# Patient Record
Sex: Female | Born: 1955 | Race: White | Hispanic: No | Marital: Single | Smoking: Former smoker
Health system: Southern US, Community
[De-identification: ages and names within clinical notes are randomized; demographics above are authoritative.]

## PROBLEM LIST (undated history)

## (undated) DIAGNOSIS — Z8711 Personal history of peptic ulcer disease: Secondary | ICD-10-CM

## (undated) DIAGNOSIS — M5136 Other intervertebral disc degeneration, lumbar region: Secondary | ICD-10-CM

## (undated) DIAGNOSIS — R112 Nausea with vomiting, unspecified: Secondary | ICD-10-CM

## (undated) DIAGNOSIS — Z860100 Personal history of colon polyps, unspecified: Secondary | ICD-10-CM

## (undated) DIAGNOSIS — K219 Gastro-esophageal reflux disease without esophagitis: Secondary | ICD-10-CM

## (undated) DIAGNOSIS — M1990 Unspecified osteoarthritis, unspecified site: Secondary | ICD-10-CM

## (undated) DIAGNOSIS — K209 Esophagitis, unspecified without bleeding: Secondary | ICD-10-CM

## (undated) DIAGNOSIS — Z8719 Personal history of other diseases of the digestive system: Secondary | ICD-10-CM

## (undated) DIAGNOSIS — Z87410 Personal history of cervical dysplasia: Secondary | ICD-10-CM

## (undated) DIAGNOSIS — Z859 Personal history of malignant neoplasm, unspecified: Secondary | ICD-10-CM

## (undated) DIAGNOSIS — Z9889 Other specified postprocedural states: Secondary | ICD-10-CM

## (undated) DIAGNOSIS — I2510 Atherosclerotic heart disease of native coronary artery without angina pectoris: Secondary | ICD-10-CM

## (undated) DIAGNOSIS — L280 Lichen simplex chronicus: Secondary | ICD-10-CM

## (undated) DIAGNOSIS — C519 Malignant neoplasm of vulva, unspecified: Secondary | ICD-10-CM

## (undated) DIAGNOSIS — E785 Hyperlipidemia, unspecified: Secondary | ICD-10-CM

## (undated) DIAGNOSIS — L409 Psoriasis, unspecified: Secondary | ICD-10-CM

## (undated) DIAGNOSIS — M419 Scoliosis, unspecified: Secondary | ICD-10-CM

## (undated) DIAGNOSIS — K2970 Gastritis, unspecified, without bleeding: Secondary | ICD-10-CM

## (undated) DIAGNOSIS — M51369 Other intervertebral disc degeneration, lumbar region without mention of lumbar back pain or lower extremity pain: Secondary | ICD-10-CM

## (undated) DIAGNOSIS — C4499 Other specified malignant neoplasm of skin, unspecified: Secondary | ICD-10-CM

## (undated) DIAGNOSIS — I10 Essential (primary) hypertension: Secondary | ICD-10-CM

## (undated) HISTORY — DX: Psoriasis, unspecified: L40.9

## (undated) HISTORY — PX: COLONOSCOPY WITH ESOPHAGOGASTRODUODENOSCOPY (EGD): SHX5779

## (undated) HISTORY — PX: BREAST EXCISIONAL BIOPSY: SUR124

## (undated) HISTORY — PX: VAGINAL HYSTERECTOMY: SUR661

## (undated) HISTORY — PX: TONSILLECTOMY: SUR1361

## (undated) HISTORY — PX: CATARACT EXTRACTION W/ INTRAOCULAR LENS  IMPLANT, BILATERAL: SHX1307

## (undated) HISTORY — PX: REDUCTION MAMMAPLASTY: SUR839

## (undated) HISTORY — DX: Malignant neoplasm of vulva, unspecified: C51.9

## (undated) HISTORY — DX: Other specified malignant neoplasm of skin, unspecified: C44.99

## (undated) HISTORY — PX: CARDIOVASCULAR STRESS TEST: SHX262

---

## 1998-11-01 ENCOUNTER — Other Ambulatory Visit: Admission: RE | Admit: 1998-11-01 | Discharge: 1998-11-01 | Payer: Self-pay | Admitting: Surgery

## 2000-03-13 ENCOUNTER — Encounter: Admission: RE | Admit: 2000-03-13 | Discharge: 2000-04-13 | Payer: Self-pay | Admitting: Internal Medicine

## 2001-08-04 ENCOUNTER — Encounter: Payer: Self-pay | Admitting: Internal Medicine

## 2001-08-04 ENCOUNTER — Encounter: Admission: RE | Admit: 2001-08-04 | Discharge: 2001-08-04 | Payer: Self-pay

## 2001-10-29 ENCOUNTER — Ambulatory Visit (HOSPITAL_BASED_OUTPATIENT_CLINIC_OR_DEPARTMENT_OTHER): Admission: RE | Admit: 2001-10-29 | Discharge: 2001-10-29 | Payer: Self-pay | Admitting: Urology

## 2002-11-24 ENCOUNTER — Ambulatory Visit (HOSPITAL_COMMUNITY): Admission: RE | Admit: 2002-11-24 | Discharge: 2002-11-24 | Payer: Self-pay | Admitting: Gastroenterology

## 2003-10-17 ENCOUNTER — Ambulatory Visit (HOSPITAL_BASED_OUTPATIENT_CLINIC_OR_DEPARTMENT_OTHER): Admission: RE | Admit: 2003-10-17 | Discharge: 2003-10-17 | Payer: Self-pay | Admitting: Orthopedic Surgery

## 2004-10-24 ENCOUNTER — Other Ambulatory Visit: Admission: RE | Admit: 2004-10-24 | Discharge: 2004-10-24 | Payer: Self-pay | Admitting: Internal Medicine

## 2006-03-11 ENCOUNTER — Other Ambulatory Visit: Admission: RE | Admit: 2006-03-11 | Discharge: 2006-03-11 | Payer: Self-pay | Admitting: Internal Medicine

## 2006-05-05 HISTORY — PX: BUNIONECTOMY: SHX129

## 2006-05-05 HISTORY — PX: BLADDER SUSPENSION: SHX72

## 2010-07-30 ENCOUNTER — Other Ambulatory Visit (HOSPITAL_BASED_OUTPATIENT_CLINIC_OR_DEPARTMENT_OTHER): Payer: Self-pay | Admitting: Family Medicine

## 2010-07-30 DIAGNOSIS — R221 Localized swelling, mass and lump, neck: Secondary | ICD-10-CM

## 2010-07-31 ENCOUNTER — Ambulatory Visit (HOSPITAL_BASED_OUTPATIENT_CLINIC_OR_DEPARTMENT_OTHER)
Admission: RE | Admit: 2010-07-31 | Discharge: 2010-07-31 | Disposition: A | Discharge status: Home / Self Care | Payer: BC Managed Care – PPO | Source: Ambulatory Visit | Attending: Family Medicine | Admitting: Family Medicine

## 2010-07-31 ENCOUNTER — Other Ambulatory Visit (HOSPITAL_BASED_OUTPATIENT_CLINIC_OR_DEPARTMENT_OTHER): Payer: Self-pay

## 2010-07-31 DIAGNOSIS — E041 Nontoxic single thyroid nodule: Secondary | ICD-10-CM | POA: Insufficient documentation

## 2010-07-31 DIAGNOSIS — R220 Localized swelling, mass and lump, head: Secondary | ICD-10-CM

## 2010-07-31 DIAGNOSIS — R221 Localized swelling, mass and lump, neck: Secondary | ICD-10-CM

## 2011-05-06 HISTORY — PX: VULVECTOMY: SHX1086

## 2012-07-02 ENCOUNTER — Ambulatory Visit: Payer: Self-pay | Admitting: Gynecology

## 2012-07-02 ENCOUNTER — Encounter: Payer: Self-pay | Admitting: Gynecology

## 2012-07-02 ENCOUNTER — Ambulatory Visit: Payer: BC Managed Care – PPO | Attending: Gynecology | Admitting: Gynecology

## 2012-07-02 VITALS — BP 124/90 | HR 66 | Temp 98.1°F | Resp 16 | Ht 63.0 in | Wt 115.5 lb

## 2012-07-02 DIAGNOSIS — N949 Unspecified condition associated with female genital organs and menstrual cycle: Secondary | ICD-10-CM | POA: Insufficient documentation

## 2012-07-02 DIAGNOSIS — C519 Malignant neoplasm of vulva, unspecified: Secondary | ICD-10-CM

## 2012-07-02 NOTE — Patient Instructions (Signed)
Return to see me in 6 months for a followup visit or as needed if you developed new symptoms.

## 2012-07-02 NOTE — Progress Notes (Signed)
Consult Note: Gyn-Onc   Stephanie Sanchez 57 y.o. female  Chief Complaint  Patient presents with  . Paget's Disease of the Vulva    New patient    Interval History: The patient underwent wide local excision of a left vulvar Paget's disease at [facility] on 05/31/2012. Her postoperative course was uncomplicated. Surgical margins were positive in several areas which is not surprising. Today she presents noting that she has some discomfort when wearing tight undergarments and some pulling in the area of the surgery. She denies any bleeding discharge or fever.   HPI: The patient presented with a left vulvar Paget's disease which was treated with wide local excision on 05/31/2012.  Review of Systems:10 point review of systems is negative as noted above.   Vitals: Blood pressure 124/90, pulse 66, temperature 98.1 F (36.7 C), resp. rate 16, height 5\' 3"  (1.6 m), weight 115 lb 8 oz (52.39 kg).  Physical Exam: General : The patient is a healthy woman in no acute distress.  HEENT: normocephalic, extraoccular movements normal; neck is supple without thyromegally  Lynphnodes: Supraclavicular and inguinal nodes not enlarged  Abdomen: Soft, non-tender, no ascites, no organomegally, no masses, no hernias  Pelvic:  EGBUS: Normal female, the surgical site is healing very well and is barely noticeable.  Vagina: Normal, no lesions  Urethra and Bladder: Normal, non-tender     Lower extremities: No edema or varicosities. Normal range of motion    Assessment/Plan: Paget's disease the left vulva. She is healing well. She will continue pelvic rest release 2 more weeks. If she has any itching she may use 10% cortisone cream. She return to see me in 6 months for continuing followup.  Allergies  Allergen Reactions  . Codeine     "comes out like a burn/rash"    Past Medical History  Diagnosis Date  . Gastric ulcer   . Paget's disease of vulva     Past Surgical History  Procedure Laterality  Date  . Vulvectomy    . Hand surgery  11/02/12    hand reconstruction at [facility]  . Abdominal hysterectomy      at age 46  . Tonsillectomy      at 16  . Bunionectomy      15 years ago  . Bladder suspension      10 years ago    Current Outpatient Prescriptions  Medication Sig Dispense Refill  . ALPRAZolam (XANAX) 1 MG tablet Take 0.5 mg by mouth at bedtime as needed for sleep.      . Cholecalciferol (VITAMIN D) 2000 UNITS tablet Take 2,000 Units by mouth daily.      Marland Kitchen estradiol (VIVELLE-DOT) 0.05 MG/24HR Place 1 patch onto the skin once a week.      Marland Kitchen omeprazole (PRILOSEC) 40 MG capsule Take 40 mg by mouth daily.       No current facility-administered medications for this visit.    History   Social History  . Marital Status: Divorced    Spouse Name: N/A    Number of Children: N/A  . Years of Education: N/A   Occupational History  . Not on file.   Social History Main Topics  . Smoking status: Never Smoker   . Smokeless tobacco: Not on file  . Alcohol Use: Yes     Comment: occas  . Drug Use: No  . Sexually Active: Not on file   Other Topics Concern  . Not on file   Social History Narrative  .  No narrative on file    Family History  Problem Relation Age of Onset  . Aortic aneurysm Mother   . Diabetes Father   . Thyroid cancer Daughter 38      Jeannette Corpus, MD 07/02/2012, 3:58 PM

## 2012-11-02 HISTORY — PX: HAND SURGERY: SHX662

## 2012-12-07 ENCOUNTER — Ambulatory Visit: Payer: BC Managed Care – PPO | Attending: Gynecology | Admitting: Gynecology

## 2012-12-07 ENCOUNTER — Encounter: Payer: Self-pay | Admitting: Gynecology

## 2012-12-07 VITALS — BP 118/80 | HR 64 | Temp 97.8°F | Resp 16 | Wt 116.8 lb

## 2012-12-07 DIAGNOSIS — Z8544 Personal history of malignant neoplasm of other female genital organs: Secondary | ICD-10-CM | POA: Insufficient documentation

## 2012-12-07 DIAGNOSIS — C519 Malignant neoplasm of vulva, unspecified: Secondary | ICD-10-CM

## 2012-12-07 DIAGNOSIS — Z09 Encounter for follow-up examination after completed treatment for conditions other than malignant neoplasm: Secondary | ICD-10-CM | POA: Insufficient documentation

## 2012-12-07 DIAGNOSIS — C4499 Other specified malignant neoplasm of skin, unspecified: Secondary | ICD-10-CM

## 2012-12-07 NOTE — Patient Instructions (Signed)
Return to see us in 6 months. 

## 2012-12-07 NOTE — Progress Notes (Signed)
Consult Note: Gyn-Onc   Stephanie Sanchez 57 y.o. female  Chief Complaint  Patient presents with  . Paget's Disease of Vulva    Follow up    Assessment: Paget's disease of the vulva. Clinically free of disease at present time.  Plan: The patient return to see [place] in 6 months.  Interval History: The patient underwent wide local excision of a left vulvar Paget's disease at [facility] on 05/31/2012. Her postoperative course was uncomplicated. Surgical margins were positive in several areas which is not surprising. Since her last visit she's done well. She denies any vulvar symptoms of burning or pruritus. She has not noticed any new lesions.   HPI: The patient presented with a left vulvar Paget's disease which was treated with wide local excision on 05/31/2012.  Review of Systems:10 point review of systems is negative as noted above.   Vitals: Blood pressure 118/80, pulse 64, temperature 97.8 F (36.6 C), temperature source Oral, resp. rate 16, weight 116 lb 12.8 oz (52.98 kg).  Physical Exam: General : The patient is a healthy woman in no acute distress.  HEENT: normocephalic, extraoccular movements normal; neck is supple without thyromegally  Lynphnodes: Supraclavicular and inguinal nodes not enlarged  Abdomen: Soft, non-tender, no ascites, no organomegally, no masses, no hernias  Pelvic:  EGBUS: Normal female, the surgical site is completely healed. The scar is very thin and very pliable. No new lesions are noted. Vagina: Normal, no lesions  Urethra and Bladder: Normal, non-tender     Lower extremities: No edema or varicosities. Normal range of motion    Assessment/Plan: Paget's disease the left vulva. She is healing well. She will continue pelvic rest release 2 more weeks. If she has any itching she may use 10% cortisone cream. She return to see me in 6 months for continuing followup.  Allergies  Allergen Reactions  . Codeine     "comes out like a burn/rash"    Past  Medical History  Diagnosis Date  . Gastric ulcer   . Paget's disease of vulva     Past Surgical History  Procedure Laterality Date  . Vulvectomy    . Hand surgery  11/02/12    hand reconstruction at [facility]  . Abdominal hysterectomy      at age 58  . Tonsillectomy      at 16  . Bunionectomy      15 years ago  . Bladder suspension      10 years ago    Current Outpatient Prescriptions  Medication Sig Dispense Refill  . ALPRAZolam (XANAX) 1 MG tablet Take 0.5 mg by mouth at bedtime as needed for sleep.      . Cholecalciferol (VITAMIN D) 2000 UNITS tablet Take 2,000 Units by mouth daily.      Marland Kitchen estradiol (VIVELLE-DOT) 0.05 MG/24HR Place 1 patch onto the skin once a week.      Marland Kitchen omeprazole (PRILOSEC) 40 MG capsule Take 40 mg by mouth daily.       No current facility-administered medications for this visit.    History   Social History  . Marital Status: Divorced    Spouse Name: N/A    Number of Children: N/A  . Years of Education: N/A   Occupational History  . Not on file.   Social History Main Topics  . Smoking status: Never Smoker   . Smokeless tobacco: Not on file  . Alcohol Use: Yes     Comment: occas  . Drug Use: No  .  Sexually Active: Not on file   Other Topics Concern  . Not on file   Social History Narrative  . No narrative on file    Family History  Problem Relation Age of Onset  . Aortic aneurysm Mother   . Diabetes Father   . Thyroid cancer Daughter 68      Jeannette Corpus, MD 12/07/2012, 9:39 AM

## 2013-06-03 ENCOUNTER — Ambulatory Visit (HOSPITAL_BASED_OUTPATIENT_CLINIC_OR_DEPARTMENT_OTHER)
Admission: RE | Admit: 2013-06-03 | Discharge: 2013-06-03 | Disposition: A | Discharge status: Home / Self Care | Payer: BC Managed Care – PPO | Source: Ambulatory Visit | Attending: Family Medicine | Admitting: Family Medicine

## 2013-06-03 ENCOUNTER — Other Ambulatory Visit (HOSPITAL_BASED_OUTPATIENT_CLINIC_OR_DEPARTMENT_OTHER): Payer: Self-pay | Admitting: Family Medicine

## 2013-06-03 DIAGNOSIS — R109 Unspecified abdominal pain: Secondary | ICD-10-CM | POA: Insufficient documentation

## 2013-06-03 DIAGNOSIS — M51379 Other intervertebral disc degeneration, lumbosacral region without mention of lumbar back pain or lower extremity pain: Secondary | ICD-10-CM | POA: Insufficient documentation

## 2013-06-03 DIAGNOSIS — M5137 Other intervertebral disc degeneration, lumbosacral region: Secondary | ICD-10-CM | POA: Insufficient documentation

## 2013-09-22 ENCOUNTER — Ambulatory Visit (INDEPENDENT_AMBULATORY_CARE_PROVIDER_SITE_OTHER): Payer: BC Managed Care – PPO | Admitting: Cardiology

## 2013-09-22 ENCOUNTER — Encounter (INDEPENDENT_AMBULATORY_CARE_PROVIDER_SITE_OTHER): Payer: Self-pay

## 2013-09-22 ENCOUNTER — Encounter: Payer: Self-pay | Admitting: Cardiology

## 2013-09-22 VITALS — BP 140/98 | HR 72 | Ht 63.0 in | Wt 118.0 lb

## 2013-09-22 DIAGNOSIS — R079 Chest pain, unspecified: Secondary | ICD-10-CM

## 2013-09-22 NOTE — Progress Notes (Signed)
[facility] [address], [address], [place]  [address] Phone: [phone] Fax:  [phone]  Date:  09/22/2013   ID:  Stephanie Sanchez, DOB 1955-09-28, MRN [id number]  PCP:  Abigail Miyamoto, MD   History of Present Illness: Stephanie Sanchez is a 58 y.o. female here for the evaluation of chest pain. Last office visit was December of 2012 with Nuclear stress test at that time was low risk. Over the last few months she has noted significant pressure/intense pain in the chest that stops her, makes her lay down and rest. Heaviness seems to have increased in the past few weeks and at times radiates to neck and jaw. Recently, head pressure and chest, faintly, and throat, short guarding pain to abdomen.   She is an avid walker for over 25 years, 4.5 miles an hour on treadmill 40 minutes each afternoon. 5 days a week. [employer]. 2 glasses sometimes 3 per night and she plans to completely stop.   LDL cholesterol 151. She did have an EGD over year ago which showed multiple superficial ulcers. Her father died at age 57 from heart disease, diabetes. She quit smoking at age 40. She was given metoprolol by Dr. Maceo Pro which she has not started.    Wt Readings from Last 3 Encounters:  09/22/13 118 lb (53.524 kg)     No past medical history on file.  No past surgical history on file.  Current Outpatient Prescriptions  Medication Sig Dispense Refill  . ALPRAZolam (XANAX) 1 MG tablet Take 0.05 mg by mouth at bedtime.       . Aspirin-Acetaminophen-Caffeine (EXCEDRIN PO) Take by mouth.      . Cholecalciferol (VITAMIN D) 2000 UNITS CAPS Take by mouth daily.      . Coenzyme Q10 (CO Q-10) 200 MG CAPS Take by mouth daily.      Marland Kitchen MINIVELLE 0.05 MG/24HR patch Place 1 patch onto the skin 2 (two) times a week.       Marland Kitchen NITROSTAT 0.4 MG SL tablet Place 0.4 mg under the tongue every 5 (five) minutes as needed.       . NON FORMULARY Wheat grass      . tretinoin (RETIN-A) 0.1 % cream Apply topically at  bedtime.      . TURMERIC PO Take by mouth.      . vitamin C (ASCORBIC ACID) 500 MG tablet Take 500 mg by mouth 2 (two) times daily.       No current facility-administered medications for this visit.    Allergies:    Allergies  Allergen Reactions  . Codeine Itching and Nausea And Vomiting    Social History:  The patient  reports that she has quit smoking. She does not have any smokeless tobacco history on file. She reports that she drinks alcohol. She reports that she does not use illicit drugs. Merlot.  Family History  Problem Relation Age of Onset  . AAA (abdominal aortic aneurysm) Mother   . Diabetes Father   . Other Father     enlarged heart  . Arrhythmia Brother   . Heart attack Maternal Grandfather   . Arrhythmia Brother     ROS:  Please see the history of present illness.   Denies fevers, bleeding, rash, syncope, orthopnea, PND, stroke symptoms.    All other systems reviewed and negative.   PHYSICAL EXAM: VS:  BP 140/98  Pulse 72  Ht 5\' 3"  (1.6 m)  Wt 118 lb (53.524  kg)  BMI 20.91 kg/m2 Well nourished, well developed, in no acute distress HEENT: normal, [place]/AT, EOMI Neck: no JVD, normal carotid upstroke, no bruit Cardiac:  normal S1, S2; RRR; no murmur Lungs:  clear to auscultation bilaterally, no wheezing, rhonchi or rales Abd: soft, nontender, no hepatomegaly, no bruits Ext: no edema, 2+ distal pulses Skin: warm and dry GU: deferred Neuro: no focal abnormalities noted, AAO x 3  EKG:  09/12/13-sinus rhythm, left anterior fascicular block, poor R wave progression no other ST segment changes.   Nuclear stress test 04/15/11-no ischemia, low risk, normal EF    ASSESSMENT AND PLAN:  1. Chest pain-possible anginal symptoms. We'll pursue nuclear stress test for further stratification. If necessary, we will start carvedilol 3.125 mg twice a day. Occasionally the alpha-blocker will help with possible microvascular disease. Continue to monitor her symptoms. I will see  her back in 6 months. I agree with alcohol cessation for her.  Signed, Candee Furbish, MD [facility]  09/22/2013 11:41 AM

## 2013-09-22 NOTE — Patient Instructions (Signed)
Your physician recommends that you continue on your current medications as directed. Please refer to the Current Medication list given to you today.  Your physician has requested that you have en exercise stress myoview. For further information please visit [email or web address]. Please follow instruction sheet, as given.  Your physician wants you to follow-up in: 6 months with Dr. Dawna Part will receive a reminder letter in the mail two months in advance. If you don't receive a letter, please call our office to schedule the follow-up appointment.

## 2013-09-30 ENCOUNTER — Ambulatory Visit (HOSPITAL_COMMUNITY): Payer: BC Managed Care – PPO | Attending: Cardiology | Admitting: Radiology

## 2013-09-30 VITALS — BP 177/102 | HR 71 | Ht 63.0 in | Wt 116.0 lb

## 2013-09-30 DIAGNOSIS — R002 Palpitations: Secondary | ICD-10-CM | POA: Insufficient documentation

## 2013-09-30 DIAGNOSIS — R079 Chest pain, unspecified: Secondary | ICD-10-CM | POA: Insufficient documentation

## 2013-09-30 MED ORDER — REGADENOSON 0.4 MG/5ML IV SOLN
0.4000 mg | Freq: Once | INTRAVENOUS | Status: AC
  Administered 2013-09-30: 0.4 mg via INTRAVENOUS

## 2013-09-30 MED ORDER — TECHNETIUM TC 99M SESTAMIBI GENERIC - CARDIOLITE
33.0000 | Freq: Once | INTRAVENOUS | Status: AC | PRN
  Administered 2013-09-30: 33 via INTRAVENOUS

## 2013-09-30 MED ORDER — TECHNETIUM TC 99M SESTAMIBI GENERIC - CARDIOLITE
11.0000 | Freq: Once | INTRAVENOUS | Status: AC | PRN
  Administered 2013-09-30: 11 via INTRAVENOUS

## 2013-09-30 NOTE — Progress Notes (Signed)
[facility] [address] [place], [place] [address] [phone]    Cardiology Nuclear Med Study  Stephanie Sanchez is a 58 y.o. female     MRN : [id number]     DOB: 1956-04-29  Procedure Date: 09/30/2013  Nuclear Med Background Indication for Stress Test:  Evaluation for Ischemia History:  no prior hx CAD; 2012 MPI-normal, EF 73% Cardiac Risk Factors: Family History - CAD, History of Smoking and Hypertension  Symptoms:  Chest Pain and Palpitations   Nuclear Pre-Procedure Caffeine/Decaff Intake:  None NPO After: 7:00pm   Lungs:  clear O2 Sat: 94% on room air. IV 0.9% NS with Angio Cath:  20g  IV Site: R Hand  IV Started by:  Annye Rusk, CNMT  Chest Size (in):  32 Cup Size: DDD  Height: 5\' 3"  (1.6 m)  Weight:  116 lb (52.617 kg)  BMI:  Body mass index is 20.55 kg/(m^2). Tech Comments:  No am meds    Nuclear Med Study 1 or 2 day study: 1 day  Stress Test Type:  Treadmill/Lexiscan  Reading MD: Mertie Moores, MD  Order Authorizing Provider:  Jerilynn Mages. Skains, MD  Resting Radionuclide: Technetium 69m Sestamibi  Resting Radionuclide Dose: 11.0 mCi   Stress Radionuclide:  Technetium 25m Sestamibi  Stress Radionuclide Dose: 33.0 mCi           Stress Protocol Rest HR: 71 Stress HR: 139  Rest BP: 177/102 Stress BP: 150/101  Exercise Time (min): n/a METS: n/a           Dose of Adenosine (mg):  n/a Dose of Lexiscan: 0.4 mg  Dose of Atropine (mg): n/a Dose of Dobutamine: n/a mcg/kg/min (at max HR)  Stress Test Technologist: Glade Lloyd, BS-ES  Nuclear Technologist:  Charlton Amor, CNMT     Rest Procedure:  Myocardial perfusion imaging was performed at rest 45 minutes following the intravenous administration of Technetium 53m Sestamibi. Rest ECG: NSR with PVCs  Stress Procedure:  The patient received IV Lexiscan 0.4 mg over 15-seconds with concurrent low level exercise and then Technetium 65m Sestamibi was injected at 30-seconds while the patient  continued walking one more minute.  Quantitative spect images were obtained after a 45-minute delay.  Attempted to walk patient on Bruce Protocol but diastolic BP too high.  Changed to Low Level Lexican,  During the infusion the patient complained of SOB and stomach cramps.  This began to resolve in recovery.  Stress ECG: No significant change from baseline ECG  QPS Raw Data Images:  Normal; no motion artifact; normal heart/lung ratio.   Stress Images:  Normal homogeneous uptake in all areas of the myocardium. Rest Images:  Normal homogeneous uptake in all areas of the myocardium. Subtraction (SDS):  No evidence of ischemia. Transient Ischemic Dilatation (Normal <1.22):  1.00 Lung/Heart Ratio (Normal <0.45):  0.35  Quantitative Gated Spect Images QGS EDV:  62 ml QGS ESV:  24 ml  Impression Exercise Capacity:  Lexiscan with low level exercise. BP Response:  Normal blood pressure response. Clinical Symptoms:  No significant symptoms noted. ECG Impression:  No significant ST segment change suggestive of ischemia. Comparison with Prior Nuclear Study: No images to compare  Overall Impression:  Normal stress nuclear study.  No evidence of ischemia.  Normal LV function.   LV Ejection Fraction: 61%.  LV Wall Motion:  NL LV Function; NL Wall Motion.   Thayer Headings, [name], MD, [facility] 09/30/2013, 5:05 PM [address]  [employer]  [place] Pager [phone]

## 2013-10-03 ENCOUNTER — Telehealth: Payer: Self-pay | Admitting: Cardiology

## 2013-10-03 NOTE — Telephone Encounter (Signed)
New message      Pt want her stress test results

## 2013-10-03 NOTE — Telephone Encounter (Signed)
Unable to locate test results - Butch Penny to call pt for clarification.

## 2013-10-03 NOTE — Telephone Encounter (Signed)
New message ° ° ° ° °Want stress test results °

## 2013-10-03 NOTE — Telephone Encounter (Signed)
Pt aware of results of stress testing from 5/29 - she will follow up with her PCP for further evaluation.

## 2013-10-04 ENCOUNTER — Ambulatory Visit: Payer: BC Managed Care – PPO | Admitting: Cardiovascular Disease

## 2013-10-04 ENCOUNTER — Telehealth: Payer: Self-pay | Admitting: Pulmonary Disease

## 2013-10-04 NOTE — Telephone Encounter (Signed)
Pt is requesting to re-establish with SN for Pulmonary as requested by her cardiologist - no acute issues at this time. Pt is aware we will need to discuss with SN about new patients and call her back  Please advise, thank you.

## 2013-10-06 NOTE — Telephone Encounter (Signed)
Per SN---  Ok to schedule appt with SN anytime that is open after next week.  thanks

## 2013-10-06 NOTE — Telephone Encounter (Signed)
Called spoke with pt. Appt scheduled for pt to see SN. Nothing further needed

## 2013-10-18 ENCOUNTER — Telehealth: Payer: Self-pay | Admitting: Pulmonary Disease

## 2013-10-18 NOTE — Telephone Encounter (Signed)
Pt states she is returning leighs call. She was to change her appt on Friday at 9:00am. She said that this was ok.  No need to call her back unless needed

## 2013-10-18 NOTE — Telephone Encounter (Signed)
appt has been changed and nothing further is needed

## 2013-10-18 NOTE — Telephone Encounter (Signed)
Leigh did you try calling pt? thanks

## 2013-10-21 ENCOUNTER — Ambulatory Visit (INDEPENDENT_AMBULATORY_CARE_PROVIDER_SITE_OTHER): Payer: BC Managed Care – PPO | Admitting: Pulmonary Disease

## 2013-10-21 ENCOUNTER — Other Ambulatory Visit: Payer: BC Managed Care – PPO

## 2013-10-21 ENCOUNTER — Telehealth: Payer: Self-pay | Admitting: Pulmonary Disease

## 2013-10-21 ENCOUNTER — Ambulatory Visit (INDEPENDENT_AMBULATORY_CARE_PROVIDER_SITE_OTHER)
Admission: RE | Admit: 2013-10-21 | Discharge: 2013-10-21 | Disposition: A | Discharge status: Home / Self Care | Payer: BC Managed Care – PPO | Source: Ambulatory Visit | Attending: Pulmonary Disease | Admitting: Pulmonary Disease

## 2013-10-21 ENCOUNTER — Encounter: Payer: Self-pay | Admitting: Pulmonary Disease

## 2013-10-21 ENCOUNTER — Encounter: Payer: Self-pay | Admitting: Gynecology

## 2013-10-21 ENCOUNTER — Other Ambulatory Visit (INDEPENDENT_AMBULATORY_CARE_PROVIDER_SITE_OTHER): Payer: BC Managed Care – PPO

## 2013-10-21 VITALS — BP 110/80 | HR 80 | Temp 98.0°F | Ht 63.0 in | Wt 116.6 lb

## 2013-10-21 DIAGNOSIS — R0989 Other specified symptoms and signs involving the circulatory and respiratory systems: Secondary | ICD-10-CM

## 2013-10-21 DIAGNOSIS — F411 Generalized anxiety disorder: Secondary | ICD-10-CM

## 2013-10-21 DIAGNOSIS — K2100 Gastro-esophageal reflux disease with esophagitis, without bleeding: Secondary | ICD-10-CM

## 2013-10-21 DIAGNOSIS — R0600 Dyspnea, unspecified: Secondary | ICD-10-CM

## 2013-10-21 DIAGNOSIS — R072 Precordial pain: Secondary | ICD-10-CM

## 2013-10-21 DIAGNOSIS — R0609 Other forms of dyspnea: Secondary | ICD-10-CM

## 2013-10-21 DIAGNOSIS — R079 Chest pain, unspecified: Secondary | ICD-10-CM | POA: Insufficient documentation

## 2013-10-21 DIAGNOSIS — M4120 Other idiopathic scoliosis, site unspecified: Secondary | ICD-10-CM

## 2013-10-21 DIAGNOSIS — K219 Gastro-esophageal reflux disease without esophagitis: Secondary | ICD-10-CM | POA: Insufficient documentation

## 2013-10-21 DIAGNOSIS — Z860100 Personal history of colon polyps, unspecified: Secondary | ICD-10-CM

## 2013-10-21 DIAGNOSIS — F419 Anxiety disorder, unspecified: Secondary | ICD-10-CM

## 2013-10-21 LAB — HEPATIC FUNCTION PANEL
ALT: 27 U/L (ref 0–35)
AST: 25 U/L (ref 0–37)
Albumin: 5 g/dL (ref 3.5–5.2)
Alkaline Phosphatase: 77 U/L (ref 39–117)
BILIRUBIN TOTAL: 0.7 mg/dL (ref 0.2–1.2)
Bilirubin, Direct: 0.1 mg/dL (ref 0.0–0.3)
TOTAL PROTEIN: 7.4 g/dL (ref 6.0–8.3)

## 2013-10-21 LAB — CBC WITH DIFFERENTIAL/PLATELET
Basophils Absolute: 0 10*3/uL (ref 0.0–0.1)
Basophils Relative: 0.8 % (ref 0.0–3.0)
EOS PCT: 2.8 % (ref 0.0–5.0)
Eosinophils Absolute: 0.1 10*3/uL (ref 0.0–0.7)
HCT: 47.1 % — ABNORMAL HIGH (ref 36.0–46.0)
Hemoglobin: 16.2 g/dL — ABNORMAL HIGH (ref 12.0–15.0)
LYMPHS PCT: 27 % (ref 12.0–46.0)
Lymphs Abs: 1.2 10*3/uL (ref 0.7–4.0)
MCHC: 34.4 g/dL (ref 30.0–36.0)
MCV: 95.3 fl (ref 78.0–100.0)
MONO ABS: 0.4 10*3/uL (ref 0.1–1.0)
Monocytes Relative: 9.1 % (ref 3.0–12.0)
NEUTROS PCT: 60.3 % (ref 43.0–77.0)
Neutro Abs: 2.7 10*3/uL (ref 1.4–7.7)
PLATELETS: 267 10*3/uL (ref 150.0–400.0)
RBC: 4.94 Mil/uL (ref 3.87–5.11)
RDW: 12.3 % (ref 11.5–15.5)
WBC: 4.5 10*3/uL (ref 4.0–10.5)

## 2013-10-21 LAB — BASIC METABOLIC PANEL
BUN: 24 mg/dL — AB (ref 6–23)
CHLORIDE: 104 meq/L (ref 96–112)
CO2: 29 meq/L (ref 19–32)
Calcium: 9.6 mg/dL (ref 8.4–10.5)
Creatinine, Ser: 0.8 mg/dL (ref 0.4–1.2)
GFR: 82.86 mL/min (ref >60.00)
GLUCOSE: 84 mg/dL (ref 70–99)
POTASSIUM: 4.2 meq/L (ref 3.5–5.1)
Sodium: 141 mEq/L (ref 135–145)

## 2013-10-21 LAB — TSH: TSH: 1.16 u[IU]/mL (ref 0.35–4.50)

## 2013-10-21 LAB — SEDIMENTATION RATE: Sed Rate: 8 mm/hr (ref 0–22)

## 2013-10-21 MED ORDER — IOHEXOL 350 MG/ML SOLN
80.0000 mL | Freq: Once | INTRAVENOUS | Status: AC | PRN
  Administered 2013-10-21: 80 mL via INTRAVENOUS

## 2013-10-21 NOTE — Telephone Encounter (Signed)
IMPRESSION:  No evidence of pulmonary embolus. No acute cardiopulmonary disease.

## 2013-10-21 NOTE — Telephone Encounter (Signed)
I spoke with patient about results and she verbalized understanding and had no questions 

## 2013-10-21 NOTE — Telephone Encounter (Signed)
Pt is requesting CT results from today. SN is off this afternoon. Please advise Dr. Lake Bells thanks

## 2013-10-21 NOTE — Patient Instructions (Signed)
Today we updated your med list in our EPIC system...    Continue your current medications the same...  Today we checked your Pulmonary function, Oxygen level w/ exercise, and routine blood work... We will sched a CT Angio of your chest to rule out blood clots...    We will contact you w/ the results when available...   Continue the Dexilant one cap taken 30 min before the 1st meal of the day...  Call for any questions.Marland KitchenMarland Kitchen

## 2013-10-22 LAB — D-DIMER, QUANTITATIVE (NOT AT ARMC): D DIMER QUANT: 0.27 ug{FEU}/mL (ref 0.00–0.48)

## 2013-10-24 ENCOUNTER — Ambulatory Visit: Payer: BC Managed Care – PPO | Admitting: Cardiology

## 2013-10-25 ENCOUNTER — Telehealth: Payer: Self-pay | Admitting: Pulmonary Disease

## 2013-10-26 ENCOUNTER — Encounter: Payer: Self-pay | Admitting: *Deleted

## 2013-10-26 NOTE — Telephone Encounter (Signed)
Notes Recorded by Elie Confer, CMA on 10/26/2013 at 9:32 AM Called and spoke with pt and she is aware of cxr results per SN. Pt voiced her understanding and nothing further is needed.   Will close message

## 2013-11-08 NOTE — Progress Notes (Signed)
Subjective:     Patient ID: Stephanie Sanchez, female   DOB: 01/04/1956, 58 y.o.   MRN: [id number]  HPI 58 y/o WF self referred for eval CP/ SOB/ DOE... PCP is Dr. Georga Bora Cards is Dr. Marlou Porch  ~  October 21, 2013:  Here for Pulmonary evaluation>  Jenny Reichmann brings a nice typed summary/ chronology of her symptoms- attacks of pressure/ pain in chest for months, heaviness has increased recently, gets winded w/ walking or hiking now, strong sensation of pulse on the left side of her body, left eye twitching, right ear has loose wax;  Her gastroenterologist strongly suggested a CT Angio of her chest for further eval...   Negligible smoking hx- quit age 74...  No hx of lung problems in the past; no hx blood clots, leg swelling, and recent trips, etc... She denies cough, sputum, hemoptysis, wheezing, etc...  We reviewed prob list, meds, xrays and labs> see below >>   CXR 6/15 showed normal heart size, scoliosis, clear lungs, NAD...   PFT 6/15 showed FVC=2.96 (98%), FEV1=2.46 (102%), %1sec=83, mid-flows=122% predicted... This is a normal baseline PFT...   AMBULATORY O2 sat monitor >> Resting O2sat on RA= 97% w/ pulse=77/min;  After walking 3 laps on RA her lowest O2sat= 97% w/ pulse=103/min...  LABS 6/15:  Chems- wnl;  CBC- wnl;  TSH=1.16;  D-dimer=0.27 (0-0.48);  Sed=8...  CT ANGIO CHEST 6/15 was neg- no signs of PE, normal heart/ Ao, no adenopathy, clear lungs...   IMPRESSION:  Neg pulmonary evaluation w/ normal CXR, PFT, Ambulatory O2 study, Labs, and CT Angio Chest; she is reassured that her lungs are OK; she has abn EKG but neg Nuclear Study & it seems likely that her chest discomfort is musculoskeletal in etiology; she also has esophagitis & antral erosions on EGD=> on Dexilant60;  It seems reasonable to continue the Dexilant daily 21min before the 1st meal of the day;  For the CWP & dyspnea-  I'd rec resting the chest, apply heat, use Tylenol as needed and consider taking a sm dose of her Alprazolam ~1/4  to 1/2 tab twice daily in morning & afternoon to help the chest wall muscles... She will call w/ any questions or further problems...           PROBLEM LIST:    CHEST PAIN >> She had an abn EKG & was sent by DrFried to Cards for further eval >>  ~  12/12: Nuclear Stress Test was low risk- no ischemia, normal EF...  ~  c/o intermittent chest pressure/ heaviness that stops her activity & makes her rest and lie down; min symptoms noted at rest, on & off symptoms w/ ADLs, symptoms occur every time she exercises; she's been an avid walker- outdoors and on treadmill...  ~  EKG 5/15 showed NSR, rate , LAD, poor R progression...  ~  5/15: Nuclear Stress Test was a neg study- no ST segment changes, no evid ischemia, normal LVF w/ EF=61% & norm wall motion, but pt reports severe CP & nausea w/ peak exercise... DrSkains sent her to GI for EGD due to "burning".  HYPERCHOLESTEROLEMIA >>  ~  FLP 12/13 showed TChol 252, TG 117, HDL 73, LDL 151 (non-HDL chol is 179); DrFried did not rec medication...   GI~ GERD, Hx superficial ulcers >>  ~  EGD 6/15 by DrMcCune in [place] showed Gr1 esophagitis, ulcers in antrum, norm duodenum; she was rec to start [place] 60mg /d, but she reports not much improvement since starting this med.Marland KitchenMarland Kitchen  Hx COLON POLYPS >> ~  Pt notes colonosco[py 2014 w/ polyp removed=> followed by DrMcCune in [place]...   GYN >> on Estradiol patch ~  Hysterectomy age 62 due to cervical dysplasia ~  Paget's dis of the vulva per DrGreywall & Dr.Clark-Pearson w/ surg at [facility] 1/14...  LUMBAR DDD & SCOLIOSIS >>  ~  CT ABD & Pelvis 1/15 done for left flank pain eval showed multilevel DDD & mod levoscoliosis in lower Tspine & lumbar area, mild atherosclerotic calcif in Abd Ao w/o aneurysm, otherw neg...  VITAMIN D Defic >> on Vit D supplement ~2000u daily...  HEADACHES >> on Exedrin prn & averages ane daily she says...  ANXIETY >> on Alprazolam 0.5mg  at bedtime...  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 Hx Psoriasis on soles of her feet in past...    Past Surgical History  Procedure Laterality Date  . Vulvectomy    . Hand surgery  11/02/12    hand reconstruction at [facility]  . Abdominal hysterectomy      at age 58  . Tonsillectomy      at 30  . Bunionectomy      15 years ago  . Bladder suspension      10 years ago  . Abdominal hysterectomy      Outpatient Encounter Prescriptions as of 10/21/2013  Medication Sig  . ALPRAZolam (XANAX) 1 MG tablet Take 0.05 mg by mouth at bedtime.   . Aspirin-Acetaminophen-Caffeine (EXCEDRIN PO) Take by mouth.  . Cholecalciferol (VITAMIN D) 2000 UNITS CAPS Take by mouth daily.  Marland Kitchen MINIVELLE 0.05 MG/24HR patch Place 1 patch onto the skin 2 (two) times a week.   . NON FORMULARY Wheat grass  . tretinoin (RETIN-A) 0.1 % cream Apply topically at bedtime.  . [DISCONTINUED] Coenzyme Q10 (CO Q-10) 200 MG CAPS Take by mouth daily.  . [DISCONTINUED] NITROSTAT 0.4 MG SL tablet Place 0.4 mg under the tongue every 5 (five) minutes as needed.   . [DISCONTINUED] TURMERIC PO Take by mouth.  . [DISCONTINUED] vitamin C (ASCORBIC ACID) 500 MG tablet Take 500 mg by mouth 2 (two) times daily.    Allergies  Allergen Reactions  . Codeine     "comes out like a burn/rash"  . Codeine Itching and Nausea And Vomiting    Family History  Problem Relation Age of Onset  . Aortic aneurysm Mother   . Thyroid cancer Daughter 38  . AAA (abdominal aortic aneurysm) Mother   . Diabetes Father   . Other Father     enlarged heart  . Arrhythmia Brother   . Heart attack Maternal Grandfather   . Arrhythmia Brother     History   Social History  . Marital Status: Divorced    Spouse Name: N/A    Number of Children: N/A  . Years of Education: N/A   Occupational History  . Not on file.   Social History Main Topics  . Smoking status: Former Smoker -- 1.00 packs/day for 5 years    Types: Cigarettes    Quit date: 10/05/1978  .  Smokeless tobacco: Never Used  . Alcohol Use: Yes     Comment: occas  . Drug Use: No  . Sexual Activity: Not on file   Other Topics Concern  . Not on file   Social History Narrative   ** Merged History Encounter **        Current Medications, Allergies, Past Medical History, Past Surgical History, Family History, and Social History  were reviewed in [facility] record.   Review of Systems    Constitutional:  Denies F/C/S, anorexia, unexpected weight change. HEENT:  occas HA;  no visual changes, earache, nasal symptoms, sore throat, hoarseness. Resp:  No cough, sputum, hemoptysis, wheezing;  Notes SOB/DOE, CP & tightness... Cardio:  +CP; but denies palpit, orthopnea, edema. GI:  Denies N/V/D/C or blood in stool; mild reflux symptoms and antral erosions on EGD... GU:  No dysuria, freq, urgency, hematuria, or flank pain. MS:  Denies joint pain, swelling, tenderness, or decr ROM; mild low back pain, scoliosis... Neuro:  No tremors, seizures, dizziness, syncope, weakness, numbness, gait abn. Skin:  No suspicious lesions or skin rash. Heme:  No adenopathy, bruising, bleeding. Psyche:  She has insomnia & some anxiety; Denies confusion, hallucinations, depression...   Objective:   Physical Exam    Vital Signs:  Reviewed...  General:  WD, WN, 58 y/o WF in NAD; alert & oriented; pleasant & cooperative... HEENT:  [place]/AT; Conjunctiva- pink, Sclera- nonicteric, EOM-wnl, PERRLA, Fundi-benign; EACs-clear, TMs-wnl; NOSE-clear; THROAT-clear & wnl. Neck:  Supple w/ full ROM; no JVD; normal carotid impulses w/o bruits; no thyromegaly or nodules palpated; no lymphadenopathy. Chest:  Clear to P & A; without wheezes, rales, or rhonchi heard. Heart:  Regular Rhythm; norm S1 & S2 without murmurs, rubs, or gallops detected. Abdomen:  Soft & nontender- no guarding or rebound; normal bowel sounds; no organomegaly or masses palpated. Ext:  Normal ROM; without deformities or  arthritic changes; no varicose veins, venous insuffic, or edema;  Pulses intact w/o bruits. Neuro:  CNs II-XII intact; motor testing normal; sensory testing normal; gait normal & balance OK, mod scoliosis... Derm:  No lesions noted; no rash etc. Lymph:  No cervical, supraclavicular, axillary, or inguinal adenopathy palpated.   Assessment:      Chest Pain and Dyspnea >> Her prev cardiac eval was neg for ischemia however she had signif chest pain at peak exercise;  She has known esophagitis and antral erosions- now on Dexilant;  She has been under a lot of stress and the neg cardiac & pulmonary evaluations should provide a measure of reassurance & relief;  For the CWP I have suggested resting the chest & apply heat as needed, she may also use Tylenol as needed & call if strong pain med required;  For her dyspnea she is encouraged to continue walking, and consider taking a sm dose of the Alprazolam ~1/4 to 1/2 tab twice daily in AM & Afternoon to help w/ the chest wall muscles as we discussed...      Plan:     Patient's Medications  New Prescriptions   No medications on file  Previous Medications   ALPRAZOLAM (XANAX) 1 MG TABLET    Take 0.5 mg by mouth at bedtime as needed for sleep.   ALPRAZOLAM (XANAX) 1 MG TABLET    Take 0.05 mg by mouth at bedtime.    ASPIRIN-ACETAMINOPHEN-CAFFEINE (EXCEDRIN PO)    Take by mouth.   CHOLECALCIFEROL (VITAMIN D) 2000 UNITS CAPS    Take by mouth daily.   CHOLECALCIFEROL (VITAMIN D) 2000 UNITS TABLET    Take 2,000 Units by mouth daily.   ESTRADIOL (VIVELLE-DOT) 0.05 MG/24HR    Place 1 patch onto the skin once a week.   MINIVELLE 0.05 MG/24HR PATCH    Place 1 patch onto the skin 2 (two) times a week.    NON FORMULARY    Wheat grass   OMEPRAZOLE (PRILOSEC) 40 MG CAPSULE  Take 40 mg by mouth daily.   TRETINOIN (RETIN-A) 0.1 % CREAM    Apply topically at bedtime.  Modified Medications   No medications on file  Discontinued Medications   COENZYME Q10 (CO  Q-10) 200 MG CAPS    Take by mouth daily.   NITROSTAT 0.4 MG SL TABLET    Place 0.4 mg under the tongue every 5 (five) minutes as needed.    TURMERIC PO    Take by mouth.   VITAMIN C (ASCORBIC ACID) 500 MG TABLET    Take 500 mg by mouth 2 (two) times daily.

## 2013-12-07 ENCOUNTER — Other Ambulatory Visit: Payer: Self-pay | Admitting: Occupational Medicine

## 2013-12-07 ENCOUNTER — Ambulatory Visit: Payer: Self-pay

## 2013-12-07 DIAGNOSIS — R52 Pain, unspecified: Secondary | ICD-10-CM

## 2014-04-21 ENCOUNTER — Ambulatory Visit: Payer: Self-pay | Admitting: Cardiology

## 2014-04-25 ENCOUNTER — Encounter: Payer: Self-pay | Admitting: Cardiology

## 2014-04-25 ENCOUNTER — Encounter: Payer: Self-pay | Admitting: Gynecology

## 2014-05-03 ENCOUNTER — Encounter: Payer: Self-pay | Admitting: Cardiology

## 2014-05-05 HISTORY — PX: CATARACT EXTRACTION W/ INTRAOCULAR LENS  IMPLANT, BILATERAL: SHX1307

## 2014-05-26 ENCOUNTER — Ambulatory Visit: Payer: BC Managed Care – PPO | Admitting: Gynecologic Oncology

## 2014-06-05 ENCOUNTER — Ambulatory Visit: Payer: BC Managed Care – PPO | Attending: Gynecologic Oncology | Admitting: Gynecologic Oncology

## 2014-06-05 ENCOUNTER — Encounter: Payer: Self-pay | Admitting: Gynecologic Oncology

## 2014-06-05 VITALS — BP 157/87 | HR 80 | Temp 98.2°F | Resp 18 | Ht 63.0 in | Wt 113.8 lb

## 2014-06-05 DIAGNOSIS — C519 Malignant neoplasm of vulva, unspecified: Secondary | ICD-10-CM

## 2014-06-05 DIAGNOSIS — C4499 Other specified malignant neoplasm of skin, unspecified: Secondary | ICD-10-CM | POA: Insufficient documentation

## 2014-06-05 NOTE — Progress Notes (Signed)
Followup Note: Gyn-Onc   Stephanie Sanchez 59 y.o. female with a history of Paget's disease  Chief Complaint  Patient presents with  . Paget's Disease    Assessment: Recurrent Paget's disease of the vulva. Await confirmation from biopsy  Plan: Vulvectomy pending biopsy results, vs 6 monthly followup  HPI: The patient underwent wide local excision of a left vulvar Paget's disease at [facility] on 05/31/2012. Her postoperative course was uncomplicated. Surgical margins were positive in several areas which is not surprising.   In the last 6 months she is noted reemergence of symptoms of severe vulvar pruritus. She underwent biopsy with Dr.Grewal which was positive for dermatitis. The patient has significant concerns that the symptoms represent recurrence of her Paget's because they feel exactly the same as his symptoms from 2014. She also reports in the last month having increasing pruritus across the mid chest.  Her most recent mammogram was 2 months ago and was normal  Her last colonoscopy was 4 months ago and was normal. She also had an EGD at this time was unremarkable with the exception of gastric ulcers.  Review of Systems:10 point review of systems is negative as noted above.   Vitals: Blood pressure 157/87, pulse 80, temperature 98.2 F (36.8 C), temperature source Oral, resp. rate 18, height 5\' 3"  (1.6 m), weight 113 lb 12.8 oz (51.619 kg), SpO2 100 %.  Physical Exam: General : The patient is a healthy woman in no acute distress.  HEENT: normocephalic, extraoccular movements normal; neck is supple without thyromegally  Lynphnodes: Supraclavicular and inguinal nodes not enlarged  Abdomen: Soft, non-tender, no ascites, no organomegally, no masses, no hernias  Pelvic:  EGBUS: Normal female, the surgical site is completely healed. The scar is very thin and very pliable. There is an area of erythema to the left of the lateral labial incision, and an erythematous lesion on the left mid  labia minora (medially). Both measured approximately 1cm. Vagina: Normal, no lesions  Urethra and Bladder: Normal, non-tender     Lower extremities: No edema or varicosities. Normal range of motion   PROCEDURE (VULVAR BIOPSIES): The patient was informed regarding the nature of the procedure. Betadine was applied to the left vulva. A total of 2 mL of 1% plain lidocaine was infiltrated into the left labia majora and minora at the sites of the biopsy. A 3 mm punch biopsy forcep was used to create circular punched specimens from the center of the clinically suspicious areas on the left lateral labia majora, and left medial labia minora. There was sent to pathology. Hemostasis was achieved with silver nitrate. The patient tolerated procedure well.  Allergies  Allergen Reactions  . Codeine Nausea Only and Rash    "comes out like a burn/rash"  . Codeine Itching and Nausea And Vomiting    Past Medical History  Diagnosis Date  . Gastric ulcer   . Paget's disease of vulva   . History of stomach ulcers   . Psoriasis   . 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 Vulvectomy    . Hand surgery  11/02/12    hand reconstruction at [facility]  . Abdominal hysterectomy      at age 31  . Tonsillectomy      at 40  . Bunionectomy      15 years ago  . Bladder suspension      10 years ago  . Abdominal  hysterectomy      Current Outpatient Prescriptions  Medication Sig Dispense Refill  . ALPRAZolam (XANAX) 0.5 MG tablet Take by mouth.    . Ascorbic Acid (VITAMIN C) 1000 MG tablet Take by mouth.    . Aspirin-Acetaminophen-Caffeine (EXCEDRIN PO) Take by mouth.    . Cholecalciferol (VITAMIN D) 2000 UNITS CAPS Take by mouth daily.    . Cholecalciferol (VITAMIN D) 2000 UNITS tablet Take 2,000 Units by mouth daily.    Marland Kitchen ibuprofen (ADVIL,MOTRIN) 200 MG tablet Take by mouth.    . magnesium 30 MG tablet Take by mouth.    Marland Kitchen MINIVELLE 0.05 MG/24HR  patch Place 1 patch onto the skin 2 (two) times a week.     . NON FORMULARY Wheat grass    . NON FORMULARY 2 times daily. Wheat grass powder mixed with water    . omeprazole (PRILOSEC) 40 MG capsule Take 40 mg by mouth daily.    . prednisoLONE acetate (PRED FORTE) 1 % ophthalmic suspension 1 drop.    Marland Kitchen tretinoin (RETIN-A) 0.1 % cream Apply topically at bedtime.    . ALPRAZolam (XANAX) 1 MG tablet Take 0.5 mg by mouth at bedtime as needed for sleep.    Marland Kitchen ALPRAZolam (XANAX) 1 MG tablet Take 0.05 mg by mouth at bedtime.     . Cholecalciferol 2000 UNITS CAPS 1 capsule.    Marland Kitchen estradiol (VIVELLE-DOT) 0.025 MG/24HR Place onto the skin.    Marland Kitchen estradiol (VIVELLE-DOT) 0.05 MG/24HR patch twice a week. patch    . estradiol (VIVELLE-DOT) 0.05 MG/24HR Place 1 patch onto the skin once a week.     No current facility-administered medications for this visit.    History   Social History  . Marital Status: Divorced    Spouse Name: N/A    Number of Children: N/A  . Years of Education: N/A   Occupational History  . Not on file.   Social History Main Topics  . Smoking status: Former Smoker -- 1.00 packs/day for 5 years    Types: Cigarettes    Quit date: 10/05/1978  . Smokeless tobacco: Never Used  . Alcohol Use: Yes     Comment: occas  . Drug Use: No  . Sexual Activity: Not on file   Other Topics Concern  . Not on file   Social History Narrative   ** Merged History Encounter **        Family History  Problem Relation Age of Onset  . Aortic aneurysm Mother   . Thyroid cancer Daughter 15  . AAA (abdominal aortic aneurysm) Mother   . Diabetes Father   . Other Father     enlarged heart  . Arrhythmia Brother   . Heart attack Maternal Grandfather   . Arrhythmia Brother       [name], MD 06/05/2014, 4:12 PM

## 2014-06-05 NOTE — Patient Instructions (Signed)
We will contact you with the results of your biopsies from today.  Please call for any questions or concerns.

## 2014-06-07 ENCOUNTER — Encounter: Payer: Self-pay | Admitting: Gynecologic Oncology

## 2014-06-07 ENCOUNTER — Ambulatory Visit: Payer: BC Managed Care – PPO | Attending: Gynecologic Oncology | Admitting: Gynecologic Oncology

## 2014-06-07 VITALS — BP 148/78 | HR 78 | Temp 97.9°F | Resp 20 | Wt 112.8 lb

## 2014-06-07 DIAGNOSIS — C519 Malignant neoplasm of vulva, unspecified: Secondary | ICD-10-CM

## 2014-06-07 DIAGNOSIS — Z7982 Long term (current) use of aspirin: Secondary | ICD-10-CM | POA: Diagnosis not present

## 2014-06-07 DIAGNOSIS — Z87891 Personal history of nicotine dependence: Secondary | ICD-10-CM | POA: Diagnosis not present

## 2014-06-07 DIAGNOSIS — N939 Abnormal uterine and vaginal bleeding, unspecified: Secondary | ICD-10-CM | POA: Insufficient documentation

## 2014-06-07 DIAGNOSIS — L409 Psoriasis, unspecified: Secondary | ICD-10-CM | POA: Diagnosis not present

## 2014-06-07 DIAGNOSIS — N9089 Other specified noninflammatory disorders of vulva and perineum: Secondary | ICD-10-CM

## 2014-06-07 DIAGNOSIS — Z885 Allergy status to narcotic agent status: Secondary | ICD-10-CM | POA: Insufficient documentation

## 2014-06-07 DIAGNOSIS — Z79899 Other long term (current) drug therapy: Secondary | ICD-10-CM | POA: Diagnosis not present

## 2014-06-07 DIAGNOSIS — L7622 Postprocedural hemorrhage and hematoma of skin and subcutaneous tissue following other procedure: Secondary | ICD-10-CM

## 2014-06-07 NOTE — Progress Notes (Signed)
Follow Up Note: Gyn-Onc  Stephanie Sanchez 59 y.o. female  CC:  Chief Complaint  Patient presents with  . Vulvar Bleeding    Follow up    HPI:  Stephanie Sanchez is a 59 year old who underwent wide local excision of a left vulvar Paget's disease at [facility] on 05/31/2012. Her postoperative course was uncomplicated. Surgical margins were positive in several areas.  On June 05, 2014, she was seen by Dr. Everitt Amber for a reemergence of symptoms of severe vulvar pruritus over the past six months. She underwent a biopsy with Dr.Grewal, referring physician, which was positive for dermatitis.   At her visit on February 1, she had two biopsies taken, left lateral labia majora and left medial labia minora.  At that time, she was to be contacted with the results to arrange for future surgical removal of the areas if needed.  Interval History:  She presents today to the office alone with complaints of moderate bright red bleeding from the vulvar biopsy site.  She states she woke up last pm with her bed moderately soaked with bright red blood.  Her underwear were also stuck to her skin.  She looked at the biopsy site with a mirror but was unable to see the area due to the significant bleeding.  She reports having light spotting after the biopsy on Monday that resolved after one day.  Yesterday morning, before the episode of moderate bleeding, she had taken some ibuprofen for a headache and drank some chinese tea.  She states she had to wear a diaper into the office today due to the amount of bleeding.  Stating she feels the bleeding is not from the vagina.  No other concerns voiced.  Review of Systems Constitutional: Feels well except worried about vulvar bleeding.  Cardiovascular: No chest pain, shortness of breath, or edema.  Pulmonary: No cough or wheeze.  Gastrointestinal: No nausea, vomiting, or diarrhea. No bright red blood per rectum or change in bowel movement.  Genitourinary: No frequency, urgency,  or dysuria. No discharge.  Musculoskeletal: No myalgia or joint pain. Neurologic: No weakness, numbness, or change in gait.  Psychology: No depression, anxiety, or insomnia.  Current Meds:  Outpatient Encounter Prescriptions as of 06/07/2014  Medication Sig  . ALPRAZolam (XANAX) 0.5 MG tablet Take by mouth.  . ALPRAZolam (XANAX) 1 MG tablet Take 0.5 mg by mouth at bedtime as needed for sleep.  Marland Kitchen ALPRAZolam (XANAX) 1 MG tablet Take 0.05 mg by mouth at bedtime.   . Ascorbic Acid (VITAMIN C) 1000 MG tablet Take by mouth.  . Aspirin-Acetaminophen-Caffeine (EXCEDRIN PO) Take by mouth.  . Cholecalciferol (VITAMIN D) 2000 UNITS CAPS Take by mouth daily.  . Cholecalciferol (VITAMIN D) 2000 UNITS tablet Take 2,000 Units by mouth daily.  . Cholecalciferol 2000 UNITS CAPS 1 capsule.  Marland Kitchen estradiol (VIVELLE-DOT) 0.025 MG/24HR Place onto the skin.  Marland Kitchen estradiol (VIVELLE-DOT) 0.05 MG/24HR patch twice a week. patch  . estradiol (VIVELLE-DOT) 0.05 MG/24HR Place 1 patch onto the skin once a week.  Marland Kitchen ibuprofen (ADVIL,MOTRIN) 200 MG tablet Take by mouth.  . magnesium 30 MG tablet Take by mouth.  Marland Kitchen MINIVELLE 0.05 MG/24HR patch Place 1 patch onto the skin 2 (two) times a week.   . NON FORMULARY Wheat grass  . NON FORMULARY 2 times daily. Wheat grass powder mixed with water  . omeprazole (PRILOSEC) 40 MG capsule Take 40 mg by mouth daily.  . prednisoLONE acetate (PRED FORTE) 1 % ophthalmic suspension 1  drop.  . tretinoin (RETIN-A) 0.1 % cream Apply topically at bedtime.    Allergy:  Allergies  Allergen Reactions  . Codeine Nausea Only and Rash    "comes out like a burn/rash"  . Codeine Itching and Nausea And Vomiting    Social Hx:   History   Social History  . Marital Status: Divorced    Spouse Name: N/A    Number of Children: N/A  . Years of Education: N/A   Occupational History  . Not on file.   Social History Main Topics  . Smoking status: Former Smoker -- 1.00 packs/day for 5 years     Types: Cigarettes    Quit date: 10/05/1978  . Smokeless tobacco: Never Used  . Alcohol Use: Yes     Comment: occas  . Drug Use: No  . Sexual Activity: Not on file   Other Topics Concern  . Not on file   Social History Narrative   ** Merged History Encounter **        Past Surgical Hx:  Past Surgical History  Procedure Laterality Date  . Vulvectomy    . Hand surgery  11/02/12    hand reconstruction at [facility]  . Abdominal hysterectomy      at age 70  . Tonsillectomy      at 70  . Bunionectomy      15 years ago  . Bladder suspension      10 years ago  . Abdominal hysterectomy      Past Medical Hx:  Past Medical History  Diagnosis Date  . Gastric ulcer   . Paget's disease of vulva   . History of stomach ulcers   . Psoriasis   . 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 Aortic aneurysm Mother   . Thyroid cancer Daughter 35  . AAA (abdominal aortic aneurysm) Mother   . Diabetes Father   . Other Father     enlarged heart  . Arrhythmia Brother   . Heart attack Maternal Grandfather   . Arrhythmia Brother     Vitals:  Blood pressure 148/78, pulse 78, temperature 97.9 F (36.6 C), temperature source Oral, resp. rate 20, weight 112 lb 12.8 oz (51.166 kg).  Physical Exam:  General: Well developed, well nourished female in no acute distress. Alert and oriented x 3.  Genitourinary:    Vulva/vagina:  Moderate amount of dried blood around the mons.  Moderate amount of bright red blood noted on the patient's diaper.  Large, golf ball size blood clot coming from the left lateral vulva biopsy site.  Blood clot removed carefully and silver nitrate applied to the bleeding area.  No signs of active bleeding after several applications.      Extremities: No bilateral cyanosis, edema, or clubbing.   Assessment/Plan: 59 year old with a history of paget's disease of the vulva s/p biopsy of the vulva x 2.   Preliminarily, pathology resulting Paget's disease with the left lateral biopsy.  Awaiting results of the left medial.  Bleeding from the left lateral biopsy site on the vulva stopped with silver nitrate.  Reportable signs and symptoms reviewed.  Patient to call for any questions or concerns and she will be contacted with the final pathology so that surgery can be arranged.      CROSS, [name] DEAL, NP 06/07/2014, 2:08 PM

## 2014-06-07 NOTE — Patient Instructions (Signed)
We will contact you with the results of your biopsy.  Please call for any further moderate bleeding.

## 2014-06-08 ENCOUNTER — Ambulatory Visit: Payer: BC Managed Care – PPO | Admitting: Gynecologic Oncology

## 2014-06-09 ENCOUNTER — Telehealth: Payer: Self-pay | Admitting: Gynecologic Oncology

## 2014-06-09 NOTE — Telephone Encounter (Signed)
Spoke with patient.  Doing well since silver nitrate application earlier in the week.  This am, she wanted to see if it would be ok for her to wait for a vulvectomy until April.  Notified Dr. Denman George.  Dr. Denman George stating it would be up to the patient as long as she was not too symptomatic.  The patient would like to think about when she would want to schedule surgery and call the office.

## 2014-08-07 ENCOUNTER — Telehealth: Payer: Self-pay | Admitting: Gynecologic Oncology

## 2014-08-07 NOTE — Telephone Encounter (Signed)
Returned call to patient.  Patient reporting "more itching in different areas, even going back to the anus."  Stating if she were to need a biopsy near her anus, she would have to be "put to sleep."  Dr. Denman George informed of the situation and advised to inform the patient that biopsies can be obtained in the OR but based on the results, it may mean that she would need a second procedure.  Patient verbalizing understanding and stating she would like to have the biopsies obtained in the OR vs the office.  WLE of the vulva scheduled for April 26 at [facility].  Patient advised she would receive a phone call from the surgery center.  Advised to call for any questions or concerns.

## 2014-08-16 ENCOUNTER — Telehealth: Payer: Self-pay | Admitting: Gynecologic Oncology

## 2014-08-16 NOTE — Telephone Encounter (Signed)
Returned call to patient.  Patient called yesterday with concerns about needing possible imaging studies due to her recurrent pagets of the vulva.  Situation discussed with Dr. Denman George who did not recommend additional imaging at this time since she was up to date with her mammogram and colonoscopy.  Patient asking about potential bladder involvement, if her lumpectomy 10 years ago could have been tied to pagets, and requesting a letter stating she would be out of work for 6 weeks post-op.  The following issues will be addressed with Dr. Denman George tomorrow and the patient will receive a return call.  Advised to call for any needs or concerns before that time.

## 2014-08-17 ENCOUNTER — Other Ambulatory Visit (HOSPITAL_BASED_OUTPATIENT_CLINIC_OR_DEPARTMENT_OTHER): Payer: BC Managed Care – PPO

## 2014-08-17 ENCOUNTER — Encounter: Payer: Self-pay | Admitting: Gynecologic Oncology

## 2014-08-17 ENCOUNTER — Ambulatory Visit: Payer: BC Managed Care – PPO | Attending: Gynecologic Oncology | Admitting: Gynecologic Oncology

## 2014-08-17 VITALS — BP 151/96 | HR 77 | Temp 98.2°F | Resp 18 | Ht 63.0 in | Wt 114.3 lb

## 2014-08-17 DIAGNOSIS — C519 Malignant neoplasm of vulva, unspecified: Secondary | ICD-10-CM

## 2014-08-17 DIAGNOSIS — C518 Malignant neoplasm of overlapping sites of vulva: Secondary | ICD-10-CM | POA: Diagnosis not present

## 2014-08-17 DIAGNOSIS — C4499 Other specified malignant neoplasm of skin, unspecified: Secondary | ICD-10-CM

## 2014-08-17 LAB — URINALYSIS, MICROSCOPIC - CHCC
BACTERIA UA: NEGATIVE
Bilirubin (Urine): NEGATIVE
Blood: NEGATIVE
CASTS: NONE SEEN
GLUCOSE UR CHCC: NEGATIVE mg/dL
Ketones: NEGATIVE mg/dL
Leukocyte Esterase: NEGATIVE
NITRITE: NEGATIVE
Protein: NEGATIVE mg/dL
RBC / HPF: NEGATIVE (ref 0–2)
SPECIFIC GRAVITY, URINE: 1.025 (ref 1.003–1.035)
Urobilinogen, UR: 0.2 mg/dL (ref 0.2–1)
pH: 5 (ref 4.6–8.0)

## 2014-08-17 NOTE — Progress Notes (Signed)
Followup Note: Gyn-Onc   Stephanie Sanchez 59 y.o. female with a history of Paget's disease  Chief Complaint  Patient presents with  . Paget's disease of vulva    Assessment: Recurrent Paget's disease of the vulva.   Plan: Simple partial left vulvectomy and biopsies of vulva and anus. She declines biopsy of anus or vulva in the office - is electing for biopsy of symptomatic areas under anesthesia. She understands that if occult pagets is confirmed on this biopsy, she will require a separate surgical procedure.  HPI: The patient underwent wide local excision of a left vulvar Paget's disease at [facility] on 05/31/2012. Her postoperative course was uncomplicated. Surgical margins were positive in several areas which is not surprising.   In the last 6 months she is noted reemergence of symptoms of severe vulvar pruritus. She underwent biopsy with Dr.Grewal which was positive for dermatitis. The patient has significant concerns that the symptoms represent recurrence of her Paget's because they feel exactly the same as his symptoms from 2014. She also reports in the last month having increasing pruritus across the mid chest.  Her most recent mammogram was 4 months ago and was normal  Her last colonoscopy was 6 months ago and was normal. She also had an EGD at this time was unremarkable with the exception of gastric ulcers.  She underwent biopsy of the left labia minora in February 2016 and this confirmed recurrence of her extramammary pagets. However, she declined immediate resection, and elected to delay until April. She has subsequently developed progressive pruritis on the bilateral labia and peri-anally. She is very concerned about progression of the disease. She also expresses significant concern regarding occult synchronous cancers, particularly the bladder. Of note she has no concerning urinary tract symptoms including no macroscopic hematuria.  Review of Systems:10 point review of  systems is negative as noted above with exception of vulvar pruritis..   Vitals: Blood pressure 151/96, pulse 77, temperature 98.2 F (36.8 C), temperature source Oral, resp. rate 18, height 5\' 3"  (1.6 m), weight 114 lb 4.8 oz (51.846 kg).  Physical Exam: General : The patient is a healthy woman in no acute distress.  HEENT: normocephalic, extraoccular movements normal; neck is supple without thyromegally  Lynphnodes: Supraclavicular and inguinal nodes not enlarged  Abdomen: Soft, non-tender, no ascites, no organomegally, no masses, no hernias  Pelvic:  EGBUS: Normal female, the surgical site is completely healed. The scar is very thin and very pliable. There is a tiny (44mm) area of erythema to the left of the lateral labial incision, and an erythematous lesion on the left mid labia minora (medially). There is a larger (2cm) strip of apparent pagets on the groove between the left labia minora and majora. 5% acetic acid was applied to vulva and anus. No additional abnormalities were noted. Vagina: Normal, no lesions  Urethra and Bladder: Normal, non-tender  Lower extremities: No edema or varicosities. Normal range of motion    Allergies  Allergen Reactions  . Codeine Nausea Only and Rash    "comes out like a burn/rash"  . Codeine Itching and Nausea And Vomiting    Past Medical History  Diagnosis Date  . Gastric ulcer   . Paget's disease of vulva   . History of stomach ulcers   . Psoriasis   . 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 Vulvectomy    . Hand surgery  11/02/12  hand reconstruction at [facility]  . Abdominal hysterectomy      at age 68  . Tonsillectomy      at 60  . Bunionectomy      15 years ago  . Bladder suspension      10 years ago  . Abdominal hysterectomy      Current Outpatient Prescriptions  Medication Sig Dispense Refill  . ALPRAZolam (XANAX) 1 MG tablet Take 0.5 mg by mouth at bedtime as  needed for sleep.    . Ascorbic Acid (VITAMIN C) 1000 MG tablet Take 1,000 mg by mouth daily.     . Aspirin-Acetaminophen-Caffeine (EXCEDRIN PO) Take by mouth as needed.     . Cholecalciferol (VITAMIN D) 2000 UNITS tablet Take 2,000 Units by mouth daily.    . magnesium 30 MG tablet Take 30 mg by mouth daily.     Marland Kitchen MINIVELLE 0.05 MG/24HR patch Place 1 patch onto the skin 2 (two) times a week.     . NON FORMULARY 2 times daily. Wheat grass powder mixed with water    . tretinoin (RETIN-A) 0.1 % cream Apply topically at bedtime.     No current facility-administered medications for this visit.    History   Social History  . Marital Status: Divorced    Spouse Name: N/A  . Number of Children: N/A  . Years of Education: N/A   Occupational History  . Not on file.   Social History Main Topics  . Smoking status: Former Smoker -- 1.00 packs/day for 5 years    Types: Cigarettes    Quit date: 10/05/1978  . Smokeless tobacco: Never Used  . Alcohol Use: Yes     Comment: occas  . Drug Use: No  . Sexual Activity: Not on file   Other Topics Concern  . Not on file   Social History Narrative   ** Merged History Encounter **        Family History  Problem Relation Age of Onset  . Aortic aneurysm Mother   . Thyroid cancer Daughter 61  . AAA (abdominal aortic aneurysm) Mother   . Diabetes Father   . Other Father     enlarged heart  . Arrhythmia Brother   . Heart attack Maternal Grandfather   . Arrhythmia Brother       [name], MD 08/17/2014, 10:52 PM

## 2014-08-22 ENCOUNTER — Telehealth: Payer: Self-pay | Admitting: Nurse Practitioner

## 2014-08-22 NOTE — Telephone Encounter (Signed)
Patient calling to request additional FMLA papers completed on her behalf. Fax number given. Will inform patient when papers are complete.

## 2014-08-24 ENCOUNTER — Telehealth: Payer: Self-pay | Admitting: *Deleted

## 2014-08-24 NOTE — Telephone Encounter (Signed)
Patient called back and is agreeable to post-op appt date and time. Told patient that if she needs to reschedule for any reason to give our clinic a call.

## 2014-08-24 NOTE — Telephone Encounter (Signed)
Called and left voicemail for patient to notify her of post-op appt 09/26/14 at 9:15am. Requested return phone call confirming that patient got VM and that appt works for her.

## 2014-08-25 ENCOUNTER — Encounter (HOSPITAL_BASED_OUTPATIENT_CLINIC_OR_DEPARTMENT_OTHER): Payer: Self-pay | Admitting: *Deleted

## 2014-08-25 NOTE — Progress Notes (Signed)
Pt instructed npo pmn 4/25.  To [facility] 4/26 @ 0730.  Needs hgb on arrival.

## 2014-08-28 NOTE — Anesthesia Preprocedure Evaluation (Addendum)
Anesthesia Evaluation  Patient identified by MRN, date of birth, ID band Patient awake    Reviewed: Allergy & Precautions, NPO status , Patient's Chart, lab work & pertinent test results  History of Anesthesia Complications (+) PONV and history of anesthetic complications  Airway Mallampati: II  TM Distance: >3 FB Neck ROM: Full    Dental no notable dental hx. (+) Dental Advisory Given   Pulmonary former smoker,  breath sounds clear to auscultation  Pulmonary exam normal       Cardiovascular Exercise Tolerance: Good negative cardio ROS  Rhythm:Regular Rate:Normal     Neuro/Psych negative neurological ROS  negative psych ROS   GI/Hepatic Neg liver ROS, GERD-  Medicated and Controlled,  Endo/Other  negative endocrine ROS  Renal/GU negative Renal ROS  Female GU complaint     Musculoskeletal  (+) Arthritis -,   Abdominal   Peds negative pediatric ROS (+)  Hematology negative hematology ROS (+)   Anesthesia Other Findings   Reproductive/Obstetrics negative OB ROS                          Anesthesia Physical Anesthesia Plan  ASA: II  Anesthesia Plan: MAC   Post-op Pain Management:    Induction: Intravenous  Airway Management Planned:   Additional Equipment:   Intra-op Plan:   Post-operative Plan:   Informed Consent: I have reviewed the patients History and Physical, chart, labs and discussed the procedure including the risks, benefits and alternatives for the proposed anesthesia with the patient or authorized representative who has indicated his/her understanding and acceptance.   Dental advisory given  Plan Discussed with: CRNA  Anesthesia Plan Comments:       Anesthesia Quick Evaluation

## 2014-08-29 ENCOUNTER — Ambulatory Visit (HOSPITAL_BASED_OUTPATIENT_CLINIC_OR_DEPARTMENT_OTHER): Payer: BC Managed Care – PPO | Admitting: Anesthesiology

## 2014-08-29 ENCOUNTER — Encounter (HOSPITAL_BASED_OUTPATIENT_CLINIC_OR_DEPARTMENT_OTHER)
Admission: RE | Disposition: A | Discharge status: Home / Self Care | Payer: Self-pay | Source: Ambulatory Visit | Attending: Gynecologic Oncology

## 2014-08-29 ENCOUNTER — Ambulatory Visit (HOSPITAL_BASED_OUTPATIENT_CLINIC_OR_DEPARTMENT_OTHER)
Admission: RE | Admit: 2014-08-29 | Discharge: 2014-08-29 | Disposition: A | Discharge status: Home / Self Care | Payer: BC Managed Care – PPO | Source: Ambulatory Visit | Attending: Gynecologic Oncology | Admitting: Gynecologic Oncology

## 2014-08-29 ENCOUNTER — Encounter (HOSPITAL_BASED_OUTPATIENT_CLINIC_OR_DEPARTMENT_OTHER): Payer: Self-pay | Admitting: Anesthesiology

## 2014-08-29 DIAGNOSIS — N952 Postmenopausal atrophic vaginitis: Secondary | ICD-10-CM | POA: Insufficient documentation

## 2014-08-29 DIAGNOSIS — C519 Malignant neoplasm of vulva, unspecified: Secondary | ICD-10-CM

## 2014-08-29 DIAGNOSIS — Z7982 Long term (current) use of aspirin: Secondary | ICD-10-CM | POA: Diagnosis not present

## 2014-08-29 DIAGNOSIS — Z90710 Acquired absence of both cervix and uterus: Secondary | ICD-10-CM | POA: Insufficient documentation

## 2014-08-29 DIAGNOSIS — C510 Malignant neoplasm of labium majus: Secondary | ICD-10-CM | POA: Diagnosis not present

## 2014-08-29 DIAGNOSIS — Z87891 Personal history of nicotine dependence: Secondary | ICD-10-CM | POA: Insufficient documentation

## 2014-08-29 DIAGNOSIS — L409 Psoriasis, unspecified: Secondary | ICD-10-CM | POA: Insufficient documentation

## 2014-08-29 DIAGNOSIS — C511 Malignant neoplasm of labium minus: Secondary | ICD-10-CM | POA: Diagnosis not present

## 2014-08-29 DIAGNOSIS — Z9079 Acquired absence of other genital organ(s): Secondary | ICD-10-CM | POA: Diagnosis not present

## 2014-08-29 DIAGNOSIS — K6289 Other specified diseases of anus and rectum: Secondary | ICD-10-CM | POA: Insufficient documentation

## 2014-08-29 DIAGNOSIS — K219 Gastro-esophageal reflux disease without esophagitis: Secondary | ICD-10-CM | POA: Insufficient documentation

## 2014-08-29 DIAGNOSIS — Z79899 Other long term (current) drug therapy: Secondary | ICD-10-CM | POA: Diagnosis not present

## 2014-08-29 DIAGNOSIS — M1990 Unspecified osteoarthritis, unspecified site: Secondary | ICD-10-CM | POA: Insufficient documentation

## 2014-08-29 DIAGNOSIS — Z85828 Personal history of other malignant neoplasm of skin: Secondary | ICD-10-CM | POA: Insufficient documentation

## 2014-08-29 DIAGNOSIS — A630 Anogenital (venereal) warts: Secondary | ICD-10-CM | POA: Diagnosis not present

## 2014-08-29 DIAGNOSIS — N949 Unspecified condition associated with female genital organs and menstrual cycle: Secondary | ICD-10-CM | POA: Diagnosis present

## 2014-08-29 HISTORY — DX: Nausea with vomiting, unspecified: R11.2

## 2014-08-29 HISTORY — DX: Scoliosis, unspecified: M41.9

## 2014-08-29 HISTORY — DX: Unspecified osteoarthritis, unspecified site: M19.90

## 2014-08-29 HISTORY — DX: Other specified postprocedural states: Z98.890

## 2014-08-29 HISTORY — PX: VULVECTOMY: SHX1086

## 2014-08-29 HISTORY — DX: Personal history of cervical dysplasia: Z87.410

## 2014-08-29 HISTORY — PX: VULVA /PERINEUM BIOPSY: SHX319

## 2014-08-29 HISTORY — DX: Gastro-esophageal reflux disease without esophagitis: K21.9

## 2014-08-29 LAB — POCT HEMOGLOBIN-HEMACUE: Hemoglobin: 16.2 g/dL — ABNORMAL HIGH (ref 12.0–15.0)

## 2014-08-29 SURGERY — WIDE EXCISION VULVECTOMY
Anesthesia: Monitor Anesthesia Care | Site: Vulva

## 2014-08-29 MED ORDER — OXYCODONE-ACETAMINOPHEN 5-325 MG PO TABS
ORAL_TABLET | ORAL | Status: AC
  Filled 2014-08-29: qty 1

## 2014-08-29 MED ORDER — LACTATED RINGERS IV SOLN
INTRAVENOUS | Status: DC
  Administered 2014-08-29 (×3): via INTRAVENOUS
  Filled 2014-08-29: qty 1000

## 2014-08-29 MED ORDER — LIDOCAINE HCL (CARDIAC) 20 MG/ML IV SOLN
INTRAVENOUS | Status: DC | PRN
  Administered 2014-08-29: 50 mg via INTRAVENOUS

## 2014-08-29 MED ORDER — KETAMINE HCL 50 MG/ML IJ SOLN
INTRAMUSCULAR | Status: AC
  Filled 2014-08-29: qty 10

## 2014-08-29 MED ORDER — OXYCODONE-ACETAMINOPHEN 10-325 MG PO TABS: 1.0000 | ORAL_TABLET | ORAL | Status: DC | PRN

## 2014-08-29 MED ORDER — FENTANYL CITRATE (PF) 100 MCG/2ML IJ SOLN
25.0000 ug | INTRAMUSCULAR | Status: DC | PRN
  Administered 2014-08-29: 50 ug via INTRAVENOUS
  Filled 2014-08-29: qty 1

## 2014-08-29 MED ORDER — MIDAZOLAM HCL 2 MG/2ML IJ SOLN
INTRAMUSCULAR | Status: AC
  Filled 2014-08-29: qty 2

## 2014-08-29 MED ORDER — PROPOFOL 500 MG/50ML IV EMUL
INTRAVENOUS | Status: DC | PRN
  Administered 2014-08-29: 200 ug/kg/min via INTRAVENOUS

## 2014-08-29 MED ORDER — FENTANYL CITRATE (PF) 100 MCG/2ML IJ SOLN
INTRAMUSCULAR | Status: AC
  Filled 2014-08-29: qty 2

## 2014-08-29 MED ORDER — FENTANYL CITRATE (PF) 100 MCG/2ML IJ SOLN
INTRAMUSCULAR | Status: AC
  Filled 2014-08-29: qty 4

## 2014-08-29 MED ORDER — PROPOFOL 10 MG/ML IV BOLUS
INTRAVENOUS | Status: DC | PRN
  Administered 2014-08-29: 30 mg via INTRAVENOUS

## 2014-08-29 MED ORDER — SODIUM CHLORIDE 0.9 % IV SOLN
250.0000 mg | INTRAVENOUS | Status: DC | PRN
  Administered 2014-08-29: 20 ug/kg/min via INTRAVENOUS

## 2014-08-29 MED ORDER — ACETIC ACID 5 % SOLN
Status: DC | PRN
  Administered 2014-08-29: 1 via TOPICAL

## 2014-08-29 MED ORDER — SITZ BATH MISC: 1.0000 | Freq: Three times a day (TID) | Status: DC

## 2014-08-29 MED ORDER — FENTANYL CITRATE (PF) 100 MCG/2ML IJ SOLN
INTRAMUSCULAR | Status: DC | PRN
  Administered 2014-08-29: 25 ug via INTRAVENOUS
  Administered 2014-08-29: 12.5 ug via INTRAVENOUS

## 2014-08-29 MED ORDER — MIDAZOLAM HCL 5 MG/5ML IJ SOLN
INTRAMUSCULAR | Status: DC | PRN
  Administered 2014-08-29: 2 mg via INTRAVENOUS

## 2014-08-29 MED ORDER — DEXAMETHASONE SODIUM PHOSPHATE 4 MG/ML IJ SOLN
INTRAMUSCULAR | Status: DC | PRN
  Administered 2014-08-29: 10 mg via INTRAVENOUS

## 2014-08-29 MED ORDER — ONDANSETRON HCL 4 MG/2ML IJ SOLN
4.0000 mg | Freq: Once | INTRAMUSCULAR | Status: DC | PRN
  Filled 2014-08-29: qty 2

## 2014-08-29 MED ORDER — ONDANSETRON HCL 4 MG/2ML IJ SOLN
INTRAMUSCULAR | Status: DC | PRN
  Administered 2014-08-29: 4 mg via INTRAVENOUS

## 2014-08-29 MED ORDER — LIDOCAINE HCL 1 % IJ SOLN
INTRAMUSCULAR | Status: DC | PRN
  Administered 2014-08-29: 15 mL

## 2014-08-29 MED ORDER — OXYCODONE-ACETAMINOPHEN 5-325 MG PO TABS
1.0000 | ORAL_TABLET | Freq: Once | ORAL | Status: AC
  Administered 2014-08-29: 1 via ORAL
  Filled 2014-08-29: qty 1

## 2014-08-29 SURGICAL SUPPLY — 37 items
BLADE CLIPPER SURG (BLADE) ×2 IMPLANT
BLADE SURG 15 STRL LF DISP TIS (BLADE) ×2 IMPLANT
BLADE SURG 15 STRL SS (BLADE) ×2
BNDG GAUZE ELAST 4 BULKY (GAUZE/BANDAGES/DRESSINGS) ×2 IMPLANT
BRIEF STRETCH FOR OB PAD LRG (UNDERPADS AND DIAPERS) ×2 IMPLANT
CANISTER SUCTION 2500CC (MISCELLANEOUS) ×2 IMPLANT
CATH ROBINSON RED A/P 14FR (CATHETERS) ×2 IMPLANT
COVER BACK TABLE 60X90IN (DRAPES) ×2 IMPLANT
DRAPE LG THREE QUARTER DISP (DRAPES) ×4 IMPLANT
DRAPE UNDERBUTTOCKS STRL (DRAPE) ×2 IMPLANT
GLOVE BIO SURGEON STRL SZ 6 (GLOVE) ×4 IMPLANT
GLOVE BIO SURGEON STRL SZ7.5 (GLOVE) ×2 IMPLANT
GLOVE BIOGEL PI IND STRL 7.0 (GLOVE) ×1 IMPLANT
GLOVE BIOGEL PI INDICATOR 7.0 (GLOVE) ×1
GLOVE SURG SS PI 6.5 STRL IVOR (GLOVE) ×2 IMPLANT
GLOVE SURG SS PI 7.5 STRL IVOR (GLOVE) ×2 IMPLANT
GOWN STRL REUS W/ TWL XL LVL3 (GOWN DISPOSABLE) ×3 IMPLANT
GOWN STRL REUS W/TWL XL LVL3 (GOWN DISPOSABLE) ×3
LEGGING LITHOTOMY PAIR STRL (DRAPES) ×2 IMPLANT
NEEDLE HYPO 25X1 1.5 SAFETY (NEEDLE) ×2 IMPLANT
NS IRRIG 500ML POUR BTL (IV SOLUTION) ×2 IMPLANT
PACK BASIN DAY SURGERY FS (CUSTOM PROCEDURE TRAY) ×2 IMPLANT
PAD OB MATERNITY 4.3X12.25 (PERSONAL CARE ITEMS) ×2 IMPLANT
PENCIL BUTTON HOLSTER BLD 10FT (ELECTRODE) ×2 IMPLANT
SUT VIC AB 2-0 SH 27 (SUTURE) ×1
SUT VIC AB 2-0 SH 27XBRD (SUTURE) ×1 IMPLANT
SUT VIC AB 3-0 SH 27 (SUTURE) ×6
SUT VIC AB 3-0 SH 27X BRD (SUTURE) ×3 IMPLANT
SUT VICRYL 4-0 PS2 18IN ABS (SUTURE) ×6 IMPLANT
SYR BULB IRRIGATION 50ML (SYRINGE) ×2 IMPLANT
SYRINGE CONTROL L 12CC (SYRINGE) ×2 IMPLANT
TOWEL OR 17X24 6PK STRL BLUE (TOWEL DISPOSABLE) ×4 IMPLANT
TRAY DSU PREP LF (CUSTOM PROCEDURE TRAY) ×2 IMPLANT
TUBE CONNECTING 12X1/4 (SUCTIONS) ×2 IMPLANT
UNDERPAD 30X30 INCONTINENT (UNDERPADS AND DIAPERS) ×2 IMPLANT
WATER STERILE IRR 500ML POUR (IV SOLUTION) ×2 IMPLANT
YANKAUER SUCT BULB TIP NO VENT (SUCTIONS) ×2 IMPLANT

## 2014-08-29 NOTE — Addendum Note (Signed)
Addendum  created 08/29/14 1333 by Wanita Chamberlain, CRNA   Modules edited: Anesthesia Flowsheet

## 2014-08-29 NOTE — Anesthesia Procedure Notes (Signed)
Procedure Name: MAC Date/Time: 08/29/2014 9:35 AM Performed by: Wanita Chamberlain Pre-anesthesia Checklist: Patient identified, Timeout performed, Emergency Drugs available, Suction available and Patient being monitored Patient Re-evaluated:Patient Re-evaluated prior to inductionOxygen Delivery Method: Nasal cannula Intubation Type: IV induction Placement Confirmation: positive ETCO2 and breath sounds checked- equal and bilateral

## 2014-08-29 NOTE — Interval H&P Note (Signed)
History and Physical Interval Note:  08/29/2014 9:34 AM  Stephanie Sanchez  has presented today for surgery, with the diagnosis of PAGETS DISEASE  The various methods of treatment have been discussed with the patient and family. After consideration of risks, benefits and other options for treatment, the patient has consented to  Procedure(s): WIDE LOCAL EXCISION OF VULVA (N/A) POSSIBLE VULVAR BIOPSY (N/A) as a surgical intervention .  The patient's history has been reviewed, patient examined, no change in status, stable for surgery.  I have reviewed the patient's chart and labs.  Questions were answered to the patient's satisfaction.     [name]

## 2014-08-29 NOTE — Op Note (Signed)
OPERATIVE NOTE  PATIENT: Stephanie Sanchez DATE: 08/29/14   Preop Diagnosis: extramammary paget's disease of the vulva  Postoperative Diagnosis: same  Surgery: simple partial left vulvectomy and vulvar biopsies  Surgeons:  [name], MD Assistant: none  Anesthesia: General   Estimated blood loss: 71ml  IVF:  130ml   Urine output: 50 ml   Complications: None   Pathology: left mid labia minora with marking stitch at 12 o'clock, left labia majora with marking stitch at 12 o'clock. Midline vaginal introitus posterior biospy, right labia minora biopsy, 12 o'clock anus biopsy.  Operative findings: slightly erythematous pagets lesions at 2 locations on left vulva - mid labia minora x 0.5cm, and peri-incisional at left labia majora anteriorally (1cm). Atrophic vaginitis and pallor in a butterfly distribution of vaginal introitus. Random biopsies taken from right, midline and anus.  Procedure: The patient was identified in the preoperative holding area. Informed consent was signed on the chart. Patient was seen history was reviewed and exam was performed.   The patient was then taken to the operating room and placed in the supine position with SCD hose on. General anesthesia was then induced without difficulty. She was then placed in the dorsolithotomy position. The perineum was prepped with Betadine. The vagina was prepped with Betadine. The patient was then draped after the prep was dried. A foley catheter to empty the bladder was placed under sterile conditions.  Timeout was performed the patient, procedure, antibiotic, allergy, and length of procedure.   The lesion was identified on the left labia minora and majora. The site of surgical excision was marked with a marking pen. The dermal plane surrounding the site of incision and beds of lesions was infiltrated with 1% lidocaine. A 10 blade scalpel was used to circumferentially excise the elipses of skin on the left  vulva. The bovie electrosurgical device was used to separate the elipses in a hemostatic fasion. The specimens were handed off the field and marked at 12 o'clock with a suture to orient. The bovie was used to achieve hemostasis at the surgical bed. 2-0 and 3-0 vicryl interrupted mattress sutures were used to reapproximate the deeper layers.  4-0 vicryl interrupted sutures were used to close the skin incisions.   Random biopsies were taken from sites of the patient's symptoms of pruritis including the right mid labia minora, posterior introitus and peri-anally.  All instrument, suture, laparotomy, Ray-Tec, and needle counts were correct x2. The patient tolerated the procedure well and was taken recovery room in stable condition. This is Everitt Amber dictating an operative note on [name].  [name], MD

## 2014-08-29 NOTE — Discharge Instructions (Signed)
Vulvectomy, Care After °The vulva is the external female genitalia, outside and around the vagina and pubic bone. It consists of: °· The skin on, and in front of, the pubic bone. °· The clitoris. °· The labia majora (large lips) on the outside of the vagina. °· The labia minora (small lips) around the opening of the vagina. °· The opening and the skin in and around the vagina. °A vulvectomy is the removal of the tissue of the vulva, which sometimes includes removal of the lymph nodes and tissue in the groin areas. °These discharge instructions provide you with general information on caring for yourself after you leave the hospital. It is also important that you know the warning signs of complications, so that you can seek treatment. Please read the instructions outlined below and refer to this sheet in the next few weeks. Your caregiver may also give you specific information and medicines. If you have any questions or complications after discharge, please call your caregiver. °ACTIVITY °· Rest as much as possible the first two weeks after discharge. °· Arrange to have help from family or others with your daily activities when you go home. °· Avoid heavy lifting (more than 5 pounds), pushing, or pulling. °· If you feel tired, balance your activity with rest periods. °· Follow your caregiver's instruction about climbing stairs and driving a car. °· Increase activity gradually. °· Do not exercise until you have permission from your caregiver. °LEG AND FOOT CARE °If your doctor has removed lymph nodes from your groin area, there may be an increase in swelling of your legs and feet. You can help prevent swelling by doing the following: °· Elevate your legs while sitting or lying down. °· If your caregiver has ordered special stockings, wear them according to instructions. °· Avoid standing in one place for long periods of time. °· Call the physical therapy department if you have any questions about swelling or treatment  for swelling. °· Avoid salt in your diet. It can cause fluid retention and swelling. °· Do not cross your legs, especially when sitting. °NUTRITION °· You may resume your normal diet. °· Drink 6 to 8 glasses of fluids a day. °· Eat a healthy, balanced diet including portions of food from the meat (protein), milk, fruit, vegetable, and bread groups. °· Your caregiver may recommend you take a multivitamin with iron. °ELIMINATION °· You may notice that your stream of urine is at a different angle, and may tend to spray. Using a plastic funnel may help to decrease urine spray. °· If constipation occurs, drink more liquids, and add more fruits, vegetables, and bran to your diet. You may take a mild laxative, such as Milk of Magnesia, Metamucil, or a stool softener such as Colace, with permission from your caregiver. °HYGIENE °· You may shower and wash your hair. °· Check with your caregiver about tub baths. °· Do not add any bath oils or chemicals to your bath water, after you have permission to take baths. °· While passing urine, pour water from a bottle or spray over your vulva to dilute the urine as it passes the incision (this will decrease burning and discomfort). °· Clean yourself well after moving your bowels. °· After urinating, do not wipe. Dap or pat dry with toilet paper or a dry cleath soft cloth. °· A sitz bath will help keep your perineal area clean, reduce swelling, and provide comfort. °· Avoid wearing underpants for the first 2 weeks and wear loose skirts to   allow circulation of air around the incision °· You do not need to apply dressings, salves or lotions to the wound. °· The stitches are self-dissolving and will absorb and disappear over a couple of months (it is normal to notice the knot from the stitches on toilet paper after voiding). °HOME CARE INSTRUCTIONS  °· Apply a soft ice pack (or frozen bag of peas) to your perineum (vulva) every hour in the first 48 hours after surgery. This will reduce  swelling. °· Avoid activities that involve a lot of friction between your legs. °· Avoid wearing pants or underpants in the 1st 2 weeks (skirts are preferable). °· Take your temperature twice a day and record it, especially if you feel feverish or have chills. °· Follow your caregiver's instructions about medicines, activity, and follow-up appointments after surgery. °· Do not drink alcohol while taking pain medicine. °· Change your dressing as advised by your caregiver. °· You may take over-the-counter medicine for pain, recommended by your caregiver. °· If your pain is not relieved with medicine, call your caregiver. °· Do not take aspirin because it can cause bleeding. °· Do not douche or use tampons (use a nonperfumed sanitary pad). °· Do not have sexual intercourse until your caregiver gives you permission (typically 6 weeks postoperatively). Hugging, kissing, and playful sexual activity is fine with your caregiver's permission. °· Warm sitz baths, with your caregiver's permission, are helpful to control swelling and discomfort. °· Take showers instead of baths, until your caregiver gives you permission to take baths. °· You may take a mild medicine for constipation, recommended by your caregiver. Bran foods and drinking a lot of fluids will help with constipation. °· Make sure your family understands everything about your operation and recovery. °SEEK MEDICAL CARE IF:  °· You notice swelling and redness around the wound area. °· You notice a foul smell coming from the wound or on the surgical dressing. °· You notice the wound is separating. °· You have painful or bloody urination. °· You develop nausea and vomiting. °· You develop diarrhea. °· You develop a rash. °· You have a reaction or allergy from the medicine. °· You feel dizzy or light-headed. °· You need stronger pain medicine. °SEEK IMMEDIATE MEDICAL CARE IF:  °· You develop a temperature of 102° F (38.9° C) or higher. °· You pass out. °· You develop  leg or chest pain. °· You develop abdominal pain. °· You develop shortness of breath. °· You develop bleeding from the wound area. °· You see pus in the wound area. °MAKE SURE YOU:  °· Understand these instructions. °· Will watch your condition. °· Will get help right away if you are not doing well or get worse. °Document Released: 12/04/2003 Document Revised: 09/05/2013 Document Reviewed: 03/23/2009 °[employer]® Patient Information ©2015 [employer]. This information is not intended to replace advice given to you by your health care provider. Make sure you discuss any questions you have with your health care provider. °Post Anesthesia Home Care Instructions ° °Activity: °Get plenty of rest for the remainder of the day. A responsible adult should stay with you for 24 hours following the procedure.  °For the next 24 hours, DO NOT: °-Drive a car °-Operate machinery °-Drink alcoholic beverages °-Take any medication unless instructed by your physician °-Make any legal decisions or sign important papers. ° °Meals: °Start with liquid foods such as gelatin or soup. Progress to regular foods as tolerated. Avoid greasy, spicy, heavy foods. If nausea and/or vomiting occur, drink   only clear liquids until the nausea and/or vomiting subsides. Call your physician if vomiting continues. ° °Special Instructions/Symptoms: °Your throat may feel dry or sore from the anesthesia or the breathing tube placed in your throat during surgery. If this causes discomfort, gargle with warm salt water. The discomfort should disappear within 24 hours. ° °If you had a scopolamine patch placed behind your ear for the management of post- operative nausea and/or vomiting: ° °1. The medication in the patch is effective for 72 hours, after which it should be removed.  Wrap patch in a tissue and discard in the trash. Wash hands thoroughly with soap and water. °2. You may remove the patch earlier than 72 hours if you experience unpleasant side effects  which may include dry mouth, dizziness or visual disturbances. °3. Avoid touching the patch. Wash your hands with soap and water after contact with the patch. °  ° °

## 2014-08-29 NOTE — Transfer of Care (Signed)
Immediate Anesthesia Transfer of Care Note  Patient: Stephanie Sanchez  Procedure(s) Performed: Procedure(s): WIDE LOCAL EXCISION OF VULVA (N/A) VULVAR BIOPSYS (N/A)  Patient Location: PACU  Anesthesia Type:MAC  Level of Consciousness: sedated and patient cooperative  Airway & Oxygen Therapy: Patient Spontanous Breathing and Patient connected to nasal cannula oxygen  Post-op Assessment: Report given to RN and Post -op Vital signs reviewed and stable  Post vital signs: Reviewed and stable  Last Vitals:  Filed Vitals:   08/29/14 0744  BP: 135/73  Pulse: 69  Temp: 36.3 C  Resp: 16    Complications: No apparent anesthesia complications

## 2014-08-29 NOTE — H&P (View-Only) (Signed)
Followup Note: Gyn-Onc   Stephanie Sanchez 59 y.o. female with a history of Paget's disease  Chief Complaint  Patient presents with  . Paget's disease of vulva    Assessment: Recurrent Paget's disease of the vulva.   Plan: Simple partial left vulvectomy and biopsies of vulva and anus. She declines biopsy of anus or vulva in the office - is electing for biopsy of symptomatic areas under anesthesia. She understands that if occult pagets is confirmed on this biopsy, she will require a separate surgical procedure.  HPI: The patient underwent wide local excision of a left vulvar Paget's disease at [facility] on 05/31/2012. Her postoperative course was uncomplicated. Surgical margins were positive in several areas which is not surprising.   In the last 6 months she is noted reemergence of symptoms of severe vulvar pruritus. She underwent biopsy with Dr.Grewal which was positive for dermatitis. The patient has significant concerns that the symptoms represent recurrence of her Paget's because they feel exactly the same as his symptoms from 2014. She also reports in the last month having increasing pruritus across the mid chest.  Her most recent mammogram was 4 months ago and was normal  Her last colonoscopy was 6 months ago and was normal. She also had an EGD at this time was unremarkable with the exception of gastric ulcers.  She underwent biopsy of the left labia minora in February 2016 and this confirmed recurrence of her extramammary pagets. However, she declined immediate resection, and elected to delay until April. She has subsequently developed progressive pruritis on the bilateral labia and peri-anally. She is very concerned about progression of the disease. She also expresses significant concern regarding occult synchronous cancers, particularly the bladder. Of note she has no concerning urinary tract symptoms including no macroscopic hematuria.  Review of Systems:10 point review of  systems is negative as noted above with exception of vulvar pruritis..   Vitals: Blood pressure 151/96, pulse 77, temperature 98.2 F (36.8 C), temperature source Oral, resp. rate 18, height 5\' 3"  (1.6 m), weight 114 lb 4.8 oz (51.846 kg).  Physical Exam: General : The patient is a healthy woman in no acute distress.  HEENT: normocephalic, extraoccular movements normal; neck is supple without thyromegally  Lynphnodes: Supraclavicular and inguinal nodes not enlarged  Abdomen: Soft, non-tender, no ascites, no organomegally, no masses, no hernias  Pelvic:  EGBUS: Normal female, the surgical site is completely healed. The scar is very thin and very pliable. There is a tiny (68mm) area of erythema to the left of the lateral labial incision, and an erythematous lesion on the left mid labia minora (medially). There is a larger (2cm) strip of apparent pagets on the groove between the left labia minora and majora. 5% acetic acid was applied to vulva and anus. No additional abnormalities were noted. Vagina: Normal, no lesions  Urethra and Bladder: Normal, non-tender  Lower extremities: No edema or varicosities. Normal range of motion    Allergies  Allergen Reactions  . Codeine Nausea Only and Rash    "comes out like a burn/rash"  . Codeine Itching and Nausea And Vomiting    Past Medical History  Diagnosis Date  . Gastric ulcer   . Paget's disease of vulva   . History of stomach ulcers   . Psoriasis   . 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 Vulvectomy    . Hand surgery  11/02/12  hand reconstruction at [facility]  . Abdominal hysterectomy      at age 68  . Tonsillectomy      at 73  . Bunionectomy      15 years ago  . Bladder suspension      10 years ago  . Abdominal hysterectomy      Current Outpatient Prescriptions  Medication Sig Dispense Refill  . ALPRAZolam (XANAX) 1 MG tablet Take 0.5 mg by mouth at bedtime as  needed for sleep.    . Ascorbic Acid (VITAMIN C) 1000 MG tablet Take 1,000 mg by mouth daily.     . Aspirin-Acetaminophen-Caffeine (EXCEDRIN PO) Take by mouth as needed.     . Cholecalciferol (VITAMIN D) 2000 UNITS tablet Take 2,000 Units by mouth daily.    . magnesium 30 MG tablet Take 30 mg by mouth daily.     Marland Kitchen MINIVELLE 0.05 MG/24HR patch Place 1 patch onto the skin 2 (two) times a week.     . NON FORMULARY 2 times daily. Wheat grass powder mixed with water    . tretinoin (RETIN-A) 0.1 % cream Apply topically at bedtime.     No current facility-administered medications for this visit.    History   Social History  . Marital Status: Divorced    Spouse Name: N/A  . Number of Children: N/A  . Years of Education: N/A   Occupational History  . Not on file.   Social History Main Topics  . Smoking status: Former Smoker -- 1.00 packs/day for 5 years    Types: Cigarettes    Quit date: 10/05/1978  . Smokeless tobacco: Never Used  . Alcohol Use: Yes     Comment: occas  . Drug Use: No  . Sexual Activity: Not on file   Other Topics Concern  . Not on file   Social History Narrative   ** Merged History Encounter **        Family History  Problem Relation Age of Onset  . Aortic aneurysm Mother   . Thyroid cancer Daughter 63  . AAA (abdominal aortic aneurysm) Mother   . Diabetes Father   . Other Father     enlarged heart  . Arrhythmia Brother   . Heart attack Maternal Grandfather   . Arrhythmia Brother       [name], MD 08/17/2014, 10:52 PM

## 2014-08-29 NOTE — Anesthesia Postprocedure Evaluation (Signed)
  Anesthesia Post-op Note  Patient: Stephanie Sanchez  Procedure(s) Performed: Procedure(s) (LRB): WIDE LOCAL EXCISION OF VULVA (N/A) VULVAR BIOPSYS (N/A)  Patient Location: PACU  Anesthesia Type: MAC  Level of Consciousness: awake and alert   Airway and Oxygen Therapy: Patient Spontanous Breathing  Post-op Pain: mild  Post-op Assessment: Post-op Vital signs reviewed, Patient's Cardiovascular Status Stable, Respiratory Function Stable, Patent Airway and No signs of Nausea or vomiting  Last Vitals:  Filed Vitals:   08/29/14 1115  BP: 127/73  Pulse: 64  Temp:   Resp: 14    Post-op Vital Signs: stable   Complications: No apparent anesthesia complications

## 2014-08-30 ENCOUNTER — Encounter (HOSPITAL_BASED_OUTPATIENT_CLINIC_OR_DEPARTMENT_OTHER): Payer: Self-pay | Admitting: Gynecologic Oncology

## 2014-09-26 ENCOUNTER — Encounter: Payer: Self-pay | Admitting: Gynecologic Oncology

## 2014-09-26 ENCOUNTER — Ambulatory Visit: Payer: BC Managed Care – PPO | Attending: Gynecologic Oncology | Admitting: Gynecologic Oncology

## 2014-09-26 VITALS — BP 147/79 | HR 79 | Temp 97.9°F | Resp 18 | Ht 63.0 in | Wt 115.8 lb

## 2014-09-26 DIAGNOSIS — L280 Lichen simplex chronicus: Secondary | ICD-10-CM | POA: Diagnosis not present

## 2014-09-26 DIAGNOSIS — C4499 Other specified malignant neoplasm of skin, unspecified: Secondary | ICD-10-CM | POA: Diagnosis not present

## 2014-09-26 DIAGNOSIS — C519 Malignant neoplasm of vulva, unspecified: Secondary | ICD-10-CM

## 2014-09-26 MED ORDER — CLOBETASOL PROPIONATE 0.05 % EX OINT: 1.0000 "application " | TOPICAL_OINTMENT | Freq: Two times a day (BID) | CUTANEOUS | Status: AC

## 2014-09-26 NOTE — Patient Instructions (Signed)
We will see you back in our office in 3 months. Please call anytime with questions or concerns. Thank you.

## 2014-09-26 NOTE — Progress Notes (Signed)
Postoperative Evaluation  Assessment:    59 y.o. 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   S/p simple partial left vulvectomy on 08/29/14.   Plan: 1) Pathology reports reviewed today 2) Treatment counseling - We discussed the high rate of recurrence of this lesion. We discussed that we do not resect to histologically negative margins, but instead opt for clinically negative margins. I recommend seeing her again in 3 months for evaluation for recurrence She was given the opportunity to ask questions, which were answered to her satisfaction, and she is agreement with the above mentioned plan of care.  3)  Prescribed clobetasol to use after her wounds have entirely healed (for lichen simplex).   HPI:  Stephanie Sanchez is a 67 y.o. year old initially seen in consultation on 07/02/12 for extramammary pagets.  She then underwent a simple partial left vulvectomy on 10/31/45 without complications.  Her postoperative course was uncomplicated.  Her final pathology revealed extramammary pagets of the left lateral labia majora with inked bilateral margins and 12 o'clock margin. The biopsies from the pruritic vulva on the posterior vagina and perinal regions revealed benign hyperplasia consistent with lichen simplex chronicus.  She is seen today for a postoperative check and to discuss her pathology results and ongoing plan.  Since discharge from the hospital, she is feeling well.  She has improving appetite, normal bowel and bladder function, and pain controlled with minimal PO medication. She has no other complaints today.    Review of systems: Constitutional:  She has no weight gain or weight loss. She has no fever or chills. Eyes: No blurred vision Ears, Nose, Mouth, Throat: No dizziness, headaches or changes in hearing. No mouth sores. Cardiovascular: No chest pain, palpitations or edema. Respiratory:  No shortness of breath, wheezing or cough Gastrointestinal:  She has normal bowel movements without diarrhea or constipation. She denies any nausea or vomiting. She denies blood in her stool or heart burn. Genitourinary:  She denies pelvic pain, pelvic pressure or changes in her urinary function. She has no hematuria, dysuria, or incontinence. She has no irregular vaginal bleeding or vaginal discharge Musculoskeletal: Denies muscle weakness or joint pains.  Skin:  She has no skin changes, rashes or itching Neurological:  Denies dizziness or headaches. No neuropathy, no numbness or tingling. Psychiatric:  She denies depression or anxiety. Hematologic/Lymphatic:   No easy bruising or bleeding   Physical Exam: Blood pressure 147/79, pulse 79, temperature 97.9 F (36.6 C), temperature source Oral, resp. rate 18, height 5\' 3"  (1.6 m), weight 115 lb 12.8 oz (52.527 kg), SpO2 100 %. General: Well dressed, well nourished in no apparent distress.   HEENT:  Normocephalic and atraumatic, no lesions.  Extraocular muscles intact. Sclerae anicteric. Pupils equal, round, reactive. No mouth sores or ulcers. Thyroid is normal size, not nodular, midline. Skin:  No lesions or rashes. Breasts:  deferred Lungs:  deferred Cardiovascular:  deferred Abdomen:  deferred Genitourinary: incisions healing normally (lateral is healed, medial still has suture material). No visible lesions. No discharge or drainage or wound separation. Extremities: No cyanosis, clubbing or edema.  No calf tenderness or erythema. No palpable cords. Psychiatric: Mood and affect are appropriate. Neurological: Awake, alert and oriented x 3. Sensation is intact, no neuropathy.  Musculoskeletal: No pain, normal strength and range of motion.   [name], MD

## 2014-12-12 ENCOUNTER — Other Ambulatory Visit: Payer: Self-pay | Admitting: Obstetrics and Gynecology

## 2014-12-13 LAB — CYTOLOGY - PAP

## 2014-12-25 ENCOUNTER — Encounter: Payer: Self-pay | Admitting: Gynecologic Oncology

## 2014-12-25 ENCOUNTER — Ambulatory Visit: Payer: BC Managed Care – PPO | Attending: Gynecologic Oncology | Admitting: Gynecologic Oncology

## 2014-12-25 VITALS — BP 139/85 | HR 70 | Temp 97.7°F | Resp 16 | Ht 63.0 in | Wt 112.9 lb

## 2014-12-25 DIAGNOSIS — C4499 Other specified malignant neoplasm of skin, unspecified: Secondary | ICD-10-CM | POA: Insufficient documentation

## 2014-12-25 DIAGNOSIS — C519 Malignant neoplasm of vulva, unspecified: Secondary | ICD-10-CM

## 2014-12-25 NOTE — Progress Notes (Signed)
Postoperative Evaluation  Assessment:    59 y.o. 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   S/p simple partial left vulvectomy on 08/29/14. No visible pagets recurrence.  Plan: 1) Continue expectant management 2) Recommended clobetasol to use for lichen simplex if she has reemergence of itch symptoms. If this does not resolve symptoms she should see me for evaluation for recurrence of Pagets 3) followup with me in 6 months and in 1 year with Dr Helane Rima for vulvar inspection.   HPI:  Stephanie Sanchez is a 18 y.o. year old initially seen in consultation on 07/02/12 for extramammary pagets.  She then underwent a simple partial left vulvectomy on 12/13/15 without complications.  Her postoperative course was uncomplicated.  Her final pathology revealed extramammary pagets of the left lateral labia majora with inked bilateral margins and 12 o'clock margin. The biopsies from the pruritic vulva on the posterior vagina and perinal regions revealed benign hyperplasia consistent with lichen simplex chronicus.  Interval Hx: She is seen today for a surveillance exam. She reports not using clobetasol. She has occasional itch, but it comes and goes and is not persistent.   Review of systems: Constitutional:  She has no weight gain or weight loss. She has no fever or chills. Eyes: No blurred vision Ears, Nose, Mouth, Throat: No dizziness, headaches or changes in hearing. No mouth sores. Cardiovascular: No chest pain, palpitations or edema. Respiratory:  No shortness of breath, wheezing or cough Gastrointestinal: She has normal bowel movements without diarrhea or constipation. She denies any nausea or vomiting. She denies blood in her stool or heart burn. Genitourinary:  She denies pelvic pain, pelvic pressure or changes in her urinary function. She has no hematuria, dysuria, or incontinence. She has no irregular vaginal bleeding or vaginal discharge Musculoskeletal:  Denies muscle weakness or joint pains.  Skin:  She has no skin changes, rashes or itching Neurological:  Denies dizziness or headaches. No neuropathy, no numbness or tingling. Psychiatric:  She denies depression or anxiety. Hematologic/Lymphatic:   No easy bruising or bleeding   Physical Exam: Blood pressure 139/85, pulse 70, temperature 97.7 F (36.5 C), temperature source Oral, resp. rate 16, height 5\' 3"  (1.6 m), weight 112 lb 14.4 oz (51.211 kg), SpO2 100 %. General: Well dressed, well nourished in no apparent distress.   HEENT:  Normocephalic and atraumatic, no lesions.  Extraocular muscles intact. Sclerae anicteric. Pupils equal, round, reactive. No mouth sores or ulcers. Thyroid is normal size, not nodular, midline. Skin:  No lesions or rashes. Breasts:  deferred Lungs:  deferred Cardiovascular:  deferred Abdomen:  deferred Genitourinary: incisions have healed entirely. Atrophic skin and mucosa. No visible lesions. No discharge. 5% acetic acid applied. No acetowhite areas identified. Extremities: No cyanosis, clubbing or edema.  No calf tenderness or erythema. No palpable cords. Psychiatric: Mood and affect are appropriate. Neurological: Awake, alert and oriented x 3. Sensation is intact, no neuropathy.  Musculoskeletal: No pain, normal strength and range of motion.   [name], MD

## 2014-12-25 NOTE — Patient Instructions (Signed)
Plan to follow up with Dr. Denman George in six months or sooner if needed.  Please call closer to the date to schedule

## 2014-12-29 ENCOUNTER — Telehealth: Payer: Self-pay | Admitting: Nutrition

## 2014-12-29 NOTE — Telephone Encounter (Signed)
Contacted patient's home phone and cell phone to follow-up on nutrition questions. Patient was not available on either line.  I left message with my name and phone number for patient to return my call.

## 2015-01-25 ENCOUNTER — Ambulatory Visit: Payer: BC Managed Care – PPO | Admitting: Nutrition

## 2015-01-25 NOTE — Progress Notes (Signed)
59 year old female diagnosed with extramammary pagets of the left vulva and lichen simplex chronicus. S/p simple partial left vulvectomy on 08/29/14. No visible pagets recurrence.  Past medical history includes gastric ulcer and GERD.  Medications include Xanax, vitamin C, vitamin D, wheatgrass powder, and turmeric.  Labs were reviewed.  Height: 63 inches. Weight: 112.9 pounds. BMI: 20  Patient requested nutrition appointment to discuss healthy diet. Patient denies nutrition impact symptoms.  Nutrition diagnosis:  Food and nutrition related knowledge deficit related to Vulva cancer as evidenced by no prior need for nutrition related information.  Intervention:  Educated patient on healthy plant-based diet.  Recommended patient increase total fruits and vegetables to 5-7 servings daily. Reviewed importance of including a variety of foods throughout the day in small amounts promote weight maintenance. Discussed exercise recommendations to include aerobic, strength, and flexibility. Provided information for Live Strong program and encouraged patient to attend yoga/tai chi. Educated patient on vitamin/mineral supplements. Questions were answered.  Teach back method used.  Contact information given.  Fact sheets were provided.  Monitoring, evaluation, goals: Patient will tolerate healthy plant-based diet to promote healthy weight and reduce risk for recurrent cancer.  Nutrition diagnosis now resolved.

## 2015-02-27 IMAGING — CT CT ANGIO CHEST
3 of 7 series · 19 of 36 positions shown · IV contrast (Omnipaque 300)
Comparison: Chest x-ray 10/21/2013

CLINICAL DATA: Shortness of breath, chest pain

EXAM:
CT ANGIOGRAPHY CHEST WITH CONTRAST
TECHNIQUE: Multidetector CT imaging of the chest was performed using the
standard protocol during bolus administration of intravenous
contrast. Multiplanar CT image reconstructions and MIPs were
obtained to evaluate the vascular anatomy.
CONTRAST:  80mL OMNIPAQUE IOHEXOL 350 MG/ML SOLN

[Series 5: thins (id) / (id) · axial · 0.55mm/px · z∈[-223,-13]mm · 15 of 241 slices shown]
[im 16/241  lung]
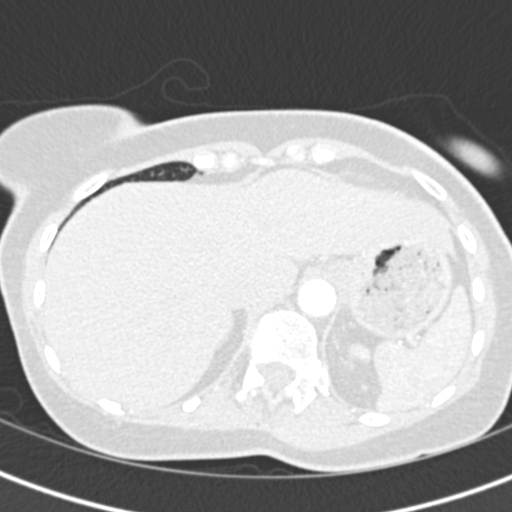
[im 31/241  mediastinal]
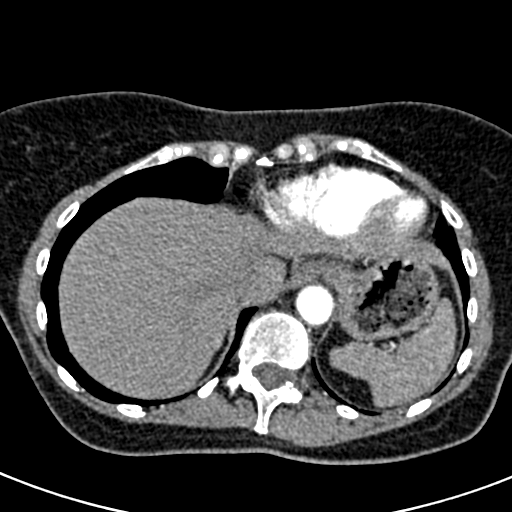
[im 46/241  lung]
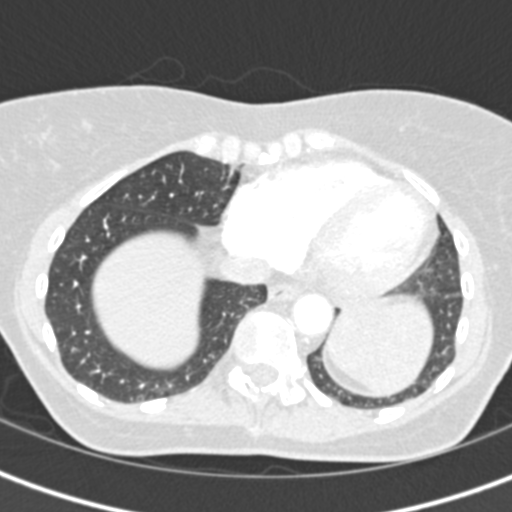
[im 61/241  mediastinal]
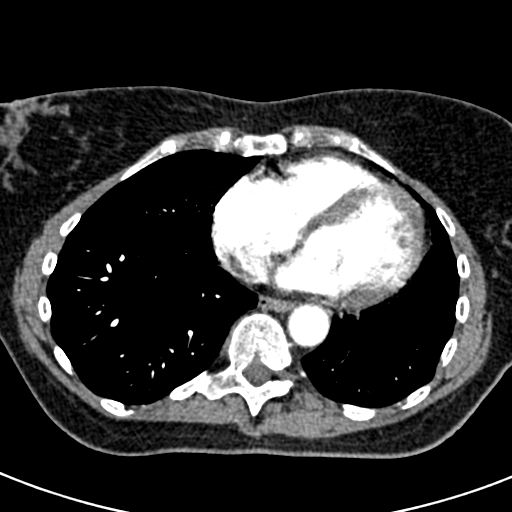
[im 76/241  lung]
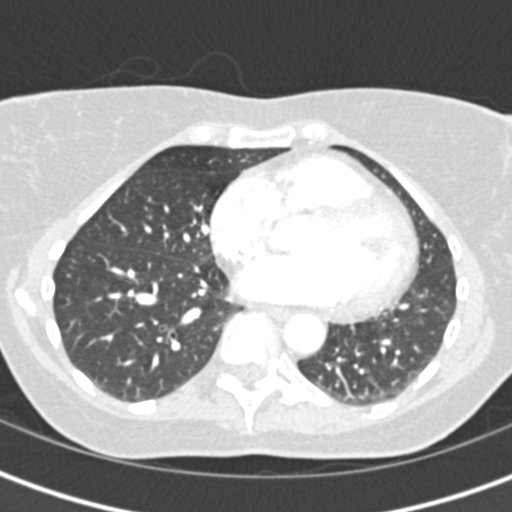
[im 91/241  mediastinal]
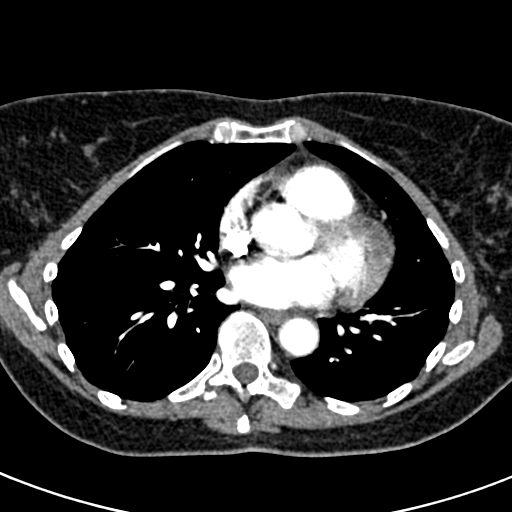
[im 106/241  lung]
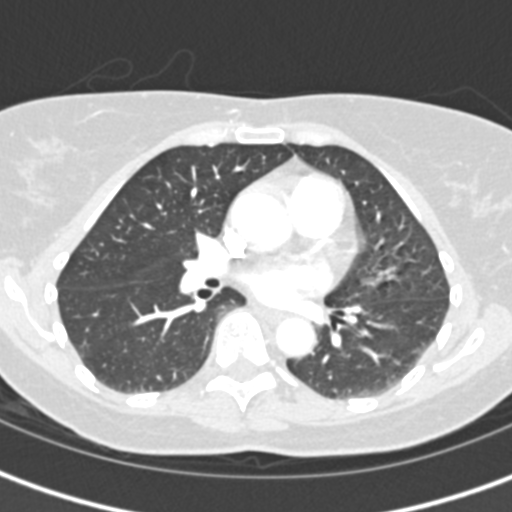
[im 121/241  mediastinal]
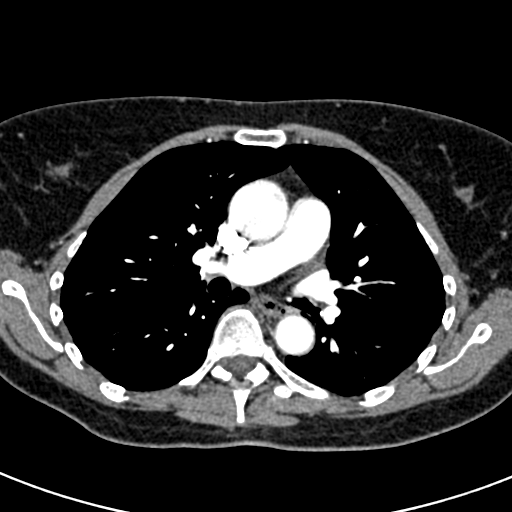
[im 136/241  lung]
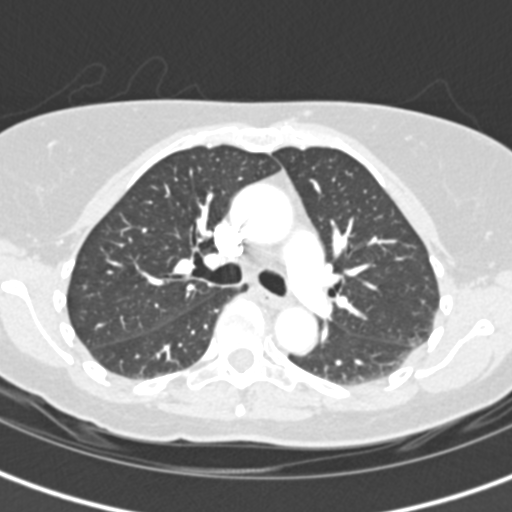
[im 151/241  mediastinal]
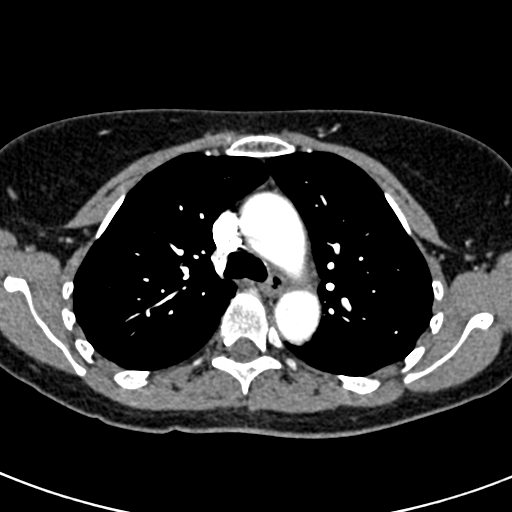
[im 166/241  lung]
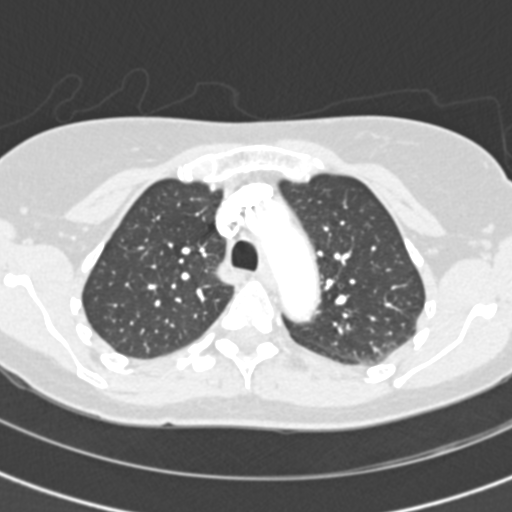
[im 181/241  mediastinal]
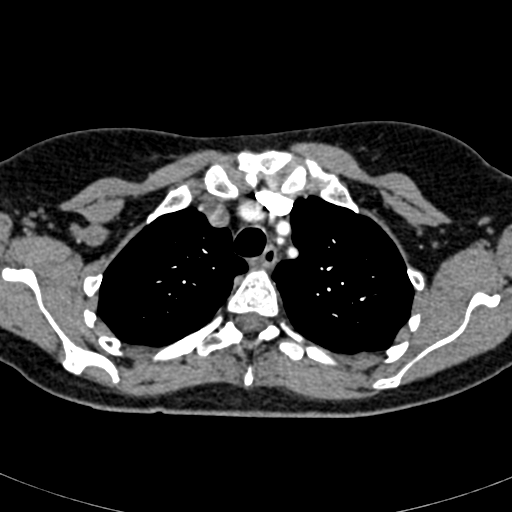
[im 196/241  lung]
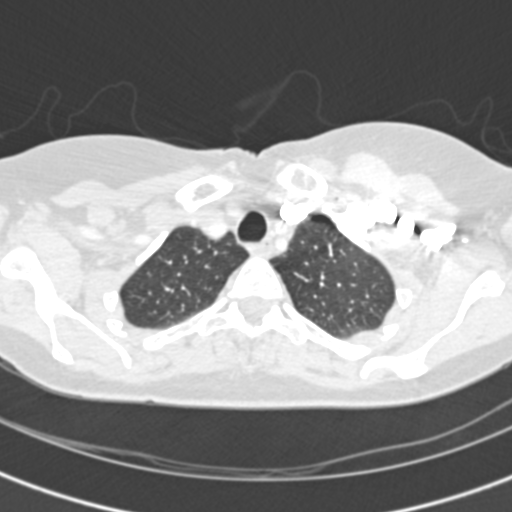
[im 211/241  mediastinal]
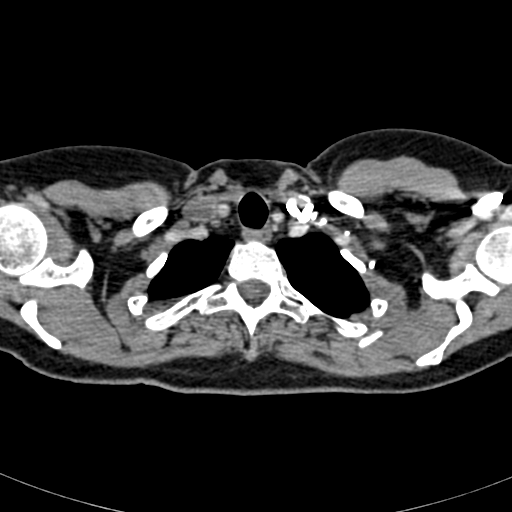
[im 226/241  lung]
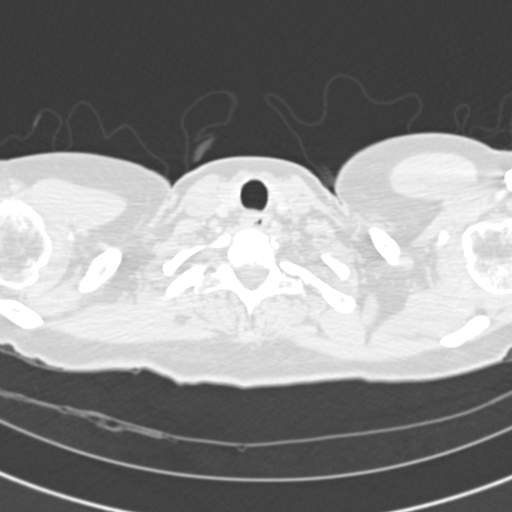

[Series 6: lung (id) / (id) · axial · 0.55mm/px · z∈[-164,-56]mm · 3 of 73 slices shown]
[im 19/73  mediastinal]
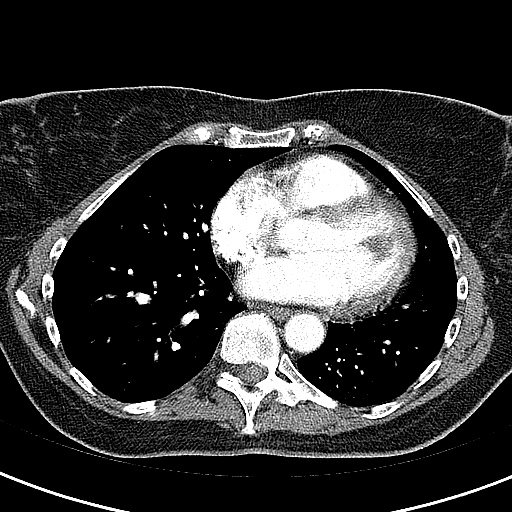
[im 37/73  mediastinal]
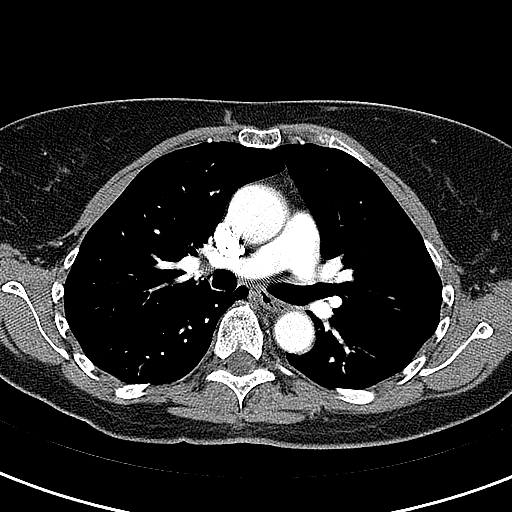
[im 55/73  mediastinal]
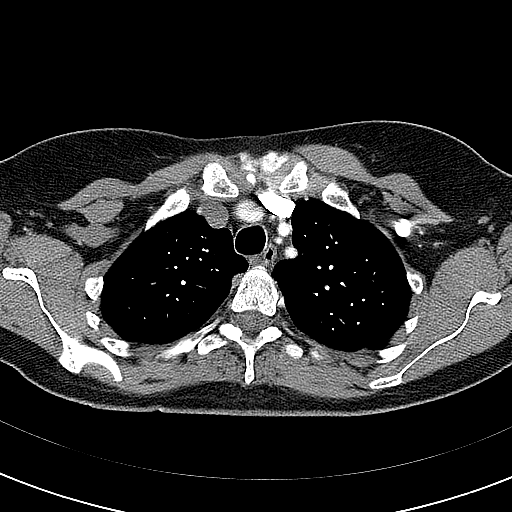

[Series 602: cor mpr · coronal · 0.55mm/px · 1 of 89 slices shown]
[im 45/89  mediastinal]
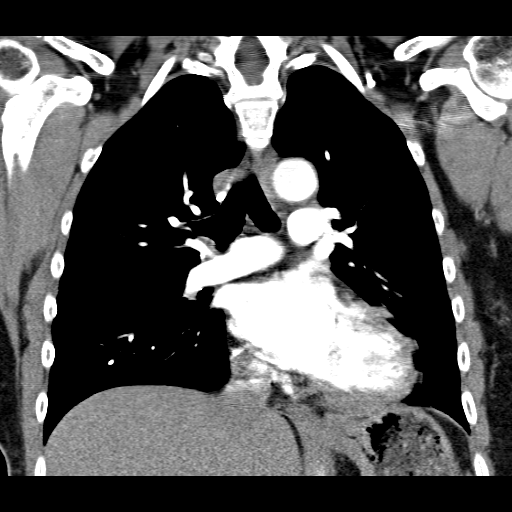

[19 of 36 positions shown; findings below may reference images not displayed]

FINDINGS: No filling defects in the pulmonary arteries to suggest pulmonary
emboli. Heart is normal size. Aorta is normal caliber. No
mediastinal, hilar, or axillary adenopathy. Chest wall soft tissues
are unremarkable. Lungs are clear. No focal airspace opacities or
suspicious nodules. No effusions. Imaging into the upper abdomen
shows no acute findings. No acute bony abnormality. Thoracolumbar
scoliosis noted.

Review of the MIP images confirms the above findings.
IMPRESSION: No evidence of pulmonary embolus.  No acute cardiopulmonary disease.

## 2015-04-13 ENCOUNTER — Encounter: Payer: Self-pay | Admitting: Gynecology

## 2015-04-13 ENCOUNTER — Ambulatory Visit: Payer: BC Managed Care – PPO | Attending: Gynecology | Admitting: Gynecology

## 2015-04-13 VITALS — BP 137/76 | HR 77 | Temp 97.8°F | Resp 18 | Ht 63.0 in | Wt 113.8 lb

## 2015-04-13 DIAGNOSIS — C4499 Other specified malignant neoplasm of skin, unspecified: Secondary | ICD-10-CM | POA: Diagnosis not present

## 2015-04-13 DIAGNOSIS — C519 Malignant neoplasm of vulva, unspecified: Secondary | ICD-10-CM

## 2015-04-13 DIAGNOSIS — L720 Epidermal cyst: Secondary | ICD-10-CM | POA: Diagnosis not present

## 2015-04-13 NOTE — Progress Notes (Signed)
Postoperative Evaluation  Assessment:   Epidermal inclusion cyst of the left posterior vulva..  Plan:  The natural history of an epidermal inclusion cyst was discussed with the patient. Given that she is entirely asymptomatic I would recommend that it be observed and find no reason to excise the lesion. I reassured the patient this has nothing to do with her prior history of Paget's disease. All questions are answered. She return to see Dr. Denman George for routine follow-up.   HPI:  Stephanie Sanchez is a 59 y.o. year old initially seen in consultation on 07/02/12 for extramammary pagets.  She then underwent a simple partial left vulvectomy on [id number] without complications.  Her postoperative course was uncomplicated.  Her final pathology revealed extramammary pagets of the left lateral labia majora with inked bilateral margins and 12 o'clock margin. The biopsies from the pruritic vulva on the posterior vagina and perinal regions revealed benign hyperplasia consistent with lichen simplex chronicus.  Interval Hx:  The patient presents today because of a new finding of her left vulva. While bathing she noticed a "lump". This is painless and there is no pruritus. She has not treated.  Review of systems: Constitutional:  She has no weight gain or weight loss. She has no fever or chills. Eyes: No blurred vision Ears, Nose, Mouth, Throat: No dizziness, headaches or changes in hearing. No mouth sores. Cardiovascular: No chest pain, palpitations or edema. Respiratory:  No shortness of breath, wheezing or cough Gastrointestinal: She has normal bowel movements without diarrhea or constipation. She denies any nausea or vomiting. She denies blood in her stool or heart burn. Genitourinary:  She denies pelvic pain, pelvic pressure or changes in her urinary function. She has no hematuria, dysuria, or incontinence. She has no irregular vaginal bleeding or vaginal discharge Musculoskeletal: Denies muscle weakness or  joint pains.  Skin:  She has no skin changes, rashes or itching Neurological:  Denies dizziness or headaches. No neuropathy, no numbness or tingling. Psychiatric:  She denies depression or anxiety. Hematologic/Lymphatic:   No easy bruising or bleeding   Physical Exam: Blood pressure 137/76, pulse 77, temperature 97.8 F (36.6 C), temperature source Oral, resp. rate 18, height 5\' 3"  (1.6 m), weight 113 lb 12.8 oz (51.619 kg). General: Well dressed, well nourished in no apparent distress.   HEENT:  Normocephalic and atraumatic, no lesions.  Extraocular muscles intact. Sclerae anicteric. Pupils equal, round, reactive. No mouth sores or ulcers. Thyroid is normal size, not nodular, midline. Skin:  No lesions or rashes. Breasts:  deferred Lungs:  deferred Cardiovascular:  deferred Abdomen:  deferred Genitourinary: incisions have healed entirely. Atrophic skin and mucosa. on the left posterior labia majora is a 1 cm inclusion cyst. This is mobile and nontender. It is not associated with the Bartholin's gland.  Extremities: No cyanosis, clubbing or edema.  No calf tenderness or erythema. No palpable cords. Psychiatric: Mood and affect are appropriate. Neurological: Awake, alert and oriented x 3. Sensation is intact, no neuropathy.  Musculoskeletal: No pain, normal strength and range of motion.   Alvino Chapel, MD

## 2015-04-13 NOTE — Patient Instructions (Signed)
Followup with Dr. Denman George as scheduled. Please call us sooner with any questions or concerns.

## 2015-04-17 ENCOUNTER — Telehealth: Payer: Self-pay | Admitting: Gynecologic Oncology

## 2015-04-17 NOTE — Telephone Encounter (Signed)
Patient informed that Dr. Denman George would like to see her in the office prior to scheduling a procedure to remove the cyst on her vulva.  Verbalizing understanding.  Appt made for Dec 19 and surgery time will be held the am of Dec 30.

## 2015-04-23 ENCOUNTER — Encounter: Payer: Self-pay | Admitting: Gynecologic Oncology

## 2015-04-23 ENCOUNTER — Ambulatory Visit: Payer: BC Managed Care – PPO | Attending: Gynecologic Oncology | Admitting: Gynecologic Oncology

## 2015-04-23 VITALS — Ht 63.0 in | Wt 115.0 lb

## 2015-04-23 DIAGNOSIS — C519 Malignant neoplasm of vulva, unspecified: Secondary | ICD-10-CM | POA: Diagnosis not present

## 2015-04-23 DIAGNOSIS — C4499 Other specified malignant neoplasm of skin, unspecified: Secondary | ICD-10-CM | POA: Diagnosis present

## 2015-04-23 NOTE — Progress Notes (Signed)
Postoperative Evaluation  Assessment:    59 y.o. 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   S/p simple partial left vulvectomy on 08/29/14.  New cystic lesion on posterior left labia majora.  Plan: 1) To OR for wide local excision left labia majora. Discussed risks of surgery (bleeding, infection, change in contour is a vulva). Discussed postoperative management of the wound and anticipated recovery. Discussed risk for wound separation. Will biopsy other sites at time of surgery to monitor for pagets given persistent itch. 2) Recommended clobetasol to use for lichen simplex if she has reemergence of itch symptoms that persist after she has healed from this surgery.     HPI:  Stephanie Sanchez is a 26 y.o. year old initially seen in consultation on 07/02/12 for extramammary pagets.  She then underwent a simple partial left vulvectomy on 99991111 without complications.  Her postoperative course was uncomplicated.  Her final pathology revealed extramammary pagets of the left lateral labia majora with inked bilateral margins and 12 o'clock margin. The biopsies from the pruritic vulva on the posterior vagina and perinal regions revealed benign hyperplasia consistent with lichen simplex chronicus.  Interval Hx: She is seen today for a surveillance exam. She reports not using clobetasol. She has occasional itch on the left. She has noticed the development of a "cyst" in the left posterior labia majora in the past month. It has decreased in size from a "kidney bean" size to now a "pea size". Tender. Increases in size when she wears pants. Strongly desires surgical removal.  Last colonoscopy 11/16  Current Outpatient Prescriptions on File Prior to Visit  Medication Sig Dispense Refill  . Ascorbic Acid (VITAMIN C) 1000 MG tablet Take 1,000 mg by mouth daily.     . Aspirin-Acetaminophen-Caffeine (EXCEDRIN PO) Take by mouth as needed.     . Cholecalciferol (VITAMIN D)  2000 UNITS tablet Take 2,000 Units by mouth daily.    . magnesium 30 MG tablet Take 30 mg by mouth daily.     . milk thistle 175 MG tablet Take 175 mg by mouth daily.    Marland Kitchen OVER THE COUNTER MEDICATION Tumeric    . tretinoin (RETIN-A) 0.1 % cream Apply topically at bedtime.     No current facility-administered medications on file prior to visit.   Allergies  Allergen Reactions  . Codeine Nausea Only and Rash    "comes out like a burn/rash"  . Codeine Itching and Nausea And Vomiting   Past Medical History  Diagnosis Date  . Gastric ulcer   . Paget's disease of vulva   . History of stomach ulcers   . Psoriasis   . 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 PONV (postoperative nausea and vomiting)   . GERD (gastroesophageal reflux disease)   . Colon polyps     hx of  . Scoliosis   . Arthritis   . History of cervical dysplasia    Past Surgical History  Procedure Laterality Date  . Vulvectomy    . Hand surgery  11/02/12    hand reconstruction at [facility]  . Abdominal hysterectomy      at age 28  . Tonsillectomy      at 89  . Bunionectomy      15 years ago  . Bladder suspension      10 years ago  . Abdominal hysterectomy    . Eye surgery Bilateral  ctaract extraction w IOL  . Vulvectomy N/A 08/29/2014    Procedure: WIDE LOCAL EXCISION OF VULVA;  Surgeon: Everitt Amber, MD;  Location: [facility];  Service: Gynecology;  Laterality: N/A;  . Vulva /perineum biopsy N/A 08/29/2014    Procedure: VULVAR BIOPSYS;  Surgeon: Everitt Amber, MD;  Location: [facility];  Service: Gynecology;  Laterality: N/A;   Family History  Problem Relation Age of Onset  . Aortic aneurysm Mother   . Thyroid cancer Daughter 80  . AAA (abdominal aortic aneurysm) Mother   . Diabetes Father   . Other Father     enlarged heart  . Arrhythmia Brother   . Heart attack Maternal Grandfather   . Arrhythmia Brother    Social History   Social History  . Marital  Status: Divorced    Spouse Name: N/A  . Number of Children: N/A  . Years of Education: N/A   Occupational History  . Not on file.   Social History Main Topics  . Smoking status: Former Smoker -- 1.00 packs/day for 5 years    Types: Cigarettes    Quit date: 10/05/1978  . Smokeless tobacco: Never Used  . Alcohol Use: 0.6 - 1.2 oz/week    1-2 Glasses of wine per week     Comment: occas  . Drug Use: No  . Sexual Activity: Not on file   Other Topics Concern  . Not on file   Social History Narrative   ** Merged History Encounter **        Review of systems: Constitutional:  She has no weight gain or weight loss. She has no fever or chills. Eyes: No blurred vision Ears, Nose, Mouth, Throat: No dizziness, headaches or changes in hearing. No mouth sores. Cardiovascular: No chest pain, palpitations or edema. Respiratory:  No shortness of breath, wheezing or cough Gastrointestinal: She has normal bowel movements without diarrhea or constipation. She denies any nausea or vomiting. She denies blood in her stool or heart burn. Genitourinary:  She denies pelvic pain, pelvic pressure or changes in her urinary function. She has no hematuria, dysuria, or incontinence. She has no irregular vaginal bleeding or vaginal discharge Musculoskeletal: Denies muscle weakness or joint pains.  Skin:  She has no skin changes, rashes or itching Neurological:  Denies dizziness or headaches. No neuropathy, no numbness or tingling. Psychiatric:  She denies depression or anxiety. Hematologic/Lymphatic:   No easy bruising or bleeding   Physical Exam: Height 5\' 3"  (1.6 m), weight 115 lb (52.164 kg). General: Well dressed, well nourished in no apparent distress.   HEENT:  Normocephalic and atraumatic, no lesions.  Extraocular muscles intact. Sclerae anicteric. Pupils equal, round, reactive. No mouth sores or ulcers. Thyroid is normal size, not nodular, midline. Skin:  No lesions or rashes. Breasts:   deferred Lungs:  deferred Cardiovascular:  deferred Abdomen:  deferred Genitourinary: incisions have healed entirely. Atrophic skin and mucosa. No visible lesions. No discharge. 1cm mobile, cystic, spherical lesion at posterior labia majora, deep to skin, no overlying mucosal abnormalities. No gross pagetoid lesions. Extremities: No cyanosis, clubbing or edema.  No calf tenderness or erythema. No palpable cords. Psychiatric: Mood and affect are appropriate. Neurological: Awake, alert and oriented x 3. Sensation is intact, no neuropathy.  Musculoskeletal: No pain, normal strength and range of motion.   [name], MD

## 2015-04-23 NOTE — Patient Instructions (Signed)
Plan for a wide local excision of the left vulva on December 30 at the [facility] at 8:30am.  You will receive a phone call from the [facility] to go over instructions, etc.  Please call for any questions or concerns.

## 2015-04-27 ENCOUNTER — Encounter (HOSPITAL_BASED_OUTPATIENT_CLINIC_OR_DEPARTMENT_OTHER): Payer: Self-pay | Admitting: *Deleted

## 2015-04-27 NOTE — Progress Notes (Signed)
NPO AFTER MN.  ARRIVE AT 0700.  NEEDS HG.  

## 2015-05-04 ENCOUNTER — Ambulatory Visit (HOSPITAL_BASED_OUTPATIENT_CLINIC_OR_DEPARTMENT_OTHER)
Admission: RE | Admit: 2015-05-04 | Discharge: 2015-05-04 | Disposition: A | Discharge status: Home / Self Care | Payer: BC Managed Care – PPO | Source: Ambulatory Visit | Attending: Gynecologic Oncology | Admitting: Gynecologic Oncology

## 2015-05-04 ENCOUNTER — Encounter (HOSPITAL_BASED_OUTPATIENT_CLINIC_OR_DEPARTMENT_OTHER)
Admission: RE | Disposition: A | Discharge status: Home / Self Care | Payer: Self-pay | Source: Ambulatory Visit | Attending: Gynecologic Oncology

## 2015-05-04 ENCOUNTER — Ambulatory Visit (HOSPITAL_BASED_OUTPATIENT_CLINIC_OR_DEPARTMENT_OTHER): Payer: BC Managed Care – PPO | Admitting: Anesthesiology

## 2015-05-04 ENCOUNTER — Encounter (HOSPITAL_BASED_OUTPATIENT_CLINIC_OR_DEPARTMENT_OTHER): Payer: Self-pay | Admitting: Anesthesiology

## 2015-05-04 DIAGNOSIS — N764 Abscess of vulva: Secondary | ICD-10-CM | POA: Diagnosis not present

## 2015-05-04 DIAGNOSIS — Z8719 Personal history of other diseases of the digestive system: Secondary | ICD-10-CM | POA: Diagnosis not present

## 2015-05-04 DIAGNOSIS — M1990 Unspecified osteoarthritis, unspecified site: Secondary | ICD-10-CM | POA: Diagnosis not present

## 2015-05-04 DIAGNOSIS — L280 Lichen simplex chronicus: Secondary | ICD-10-CM | POA: Insufficient documentation

## 2015-05-04 DIAGNOSIS — Z885 Allergy status to narcotic agent status: Secondary | ICD-10-CM | POA: Insufficient documentation

## 2015-05-04 DIAGNOSIS — L409 Psoriasis, unspecified: Secondary | ICD-10-CM | POA: Diagnosis not present

## 2015-05-04 DIAGNOSIS — Z87891 Personal history of nicotine dependence: Secondary | ICD-10-CM | POA: Diagnosis not present

## 2015-05-04 DIAGNOSIS — N949 Unspecified condition associated with female genital organs and menstrual cycle: Secondary | ICD-10-CM | POA: Diagnosis present

## 2015-05-04 DIAGNOSIS — K219 Gastro-esophageal reflux disease without esophagitis: Secondary | ICD-10-CM | POA: Insufficient documentation

## 2015-05-04 DIAGNOSIS — C519 Malignant neoplasm of vulva, unspecified: Secondary | ICD-10-CM | POA: Diagnosis not present

## 2015-05-04 DIAGNOSIS — M419 Scoliosis, unspecified: Secondary | ICD-10-CM | POA: Diagnosis not present

## 2015-05-04 DIAGNOSIS — N9089 Other specified noninflammatory disorders of vulva and perineum: Secondary | ICD-10-CM | POA: Diagnosis present

## 2015-05-04 HISTORY — PX: VULVECTOMY: SHX1086

## 2015-05-04 HISTORY — DX: Personal history of colonic polyps: Z86.010

## 2015-05-04 HISTORY — DX: Personal history of colon polyps, unspecified: Z86.0100

## 2015-05-04 HISTORY — DX: Personal history of malignant neoplasm, unspecified: Z85.9

## 2015-05-04 HISTORY — DX: Personal history of other diseases of the digestive system: Z87.19

## 2015-05-04 HISTORY — DX: Personal history of peptic ulcer disease: Z87.11

## 2015-05-04 HISTORY — DX: Other specified postprocedural states: Z98.890

## 2015-05-04 LAB — POCT HEMOGLOBIN-HEMACUE: HEMOGLOBIN: 14.8 g/dL (ref 12.0–15.0)

## 2015-05-04 SURGERY — WIDE EXCISION VULVECTOMY
Anesthesia: Monitor Anesthesia Care | Site: Vulva

## 2015-05-04 MED ORDER — ACETIC ACID 5 % SOLN
Status: DC | PRN
  Administered 2015-05-04: 1 via TOPICAL

## 2015-05-04 MED ORDER — SODIUM CHLORIDE 0.9 % IR SOLN
Status: DC | PRN
  Administered 2015-05-04: 500 mL

## 2015-05-04 MED ORDER — LACTATED RINGERS IV SOLN
INTRAVENOUS | Status: DC
  Administered 2015-05-04 (×2): via INTRAVENOUS
  Filled 2015-05-04: qty 1000

## 2015-05-04 MED ORDER — DEXAMETHASONE SODIUM PHOSPHATE 10 MG/ML IJ SOLN
INTRAMUSCULAR | Status: AC
  Filled 2015-05-04: qty 1

## 2015-05-04 MED ORDER — LIDOCAINE HCL (CARDIAC) 20 MG/ML IV SOLN
INTRAVENOUS | Status: AC
  Filled 2015-05-04: qty 5

## 2015-05-04 MED ORDER — ONDANSETRON HCL 4 MG/2ML IJ SOLN
INTRAMUSCULAR | Status: AC
  Filled 2015-05-04: qty 2

## 2015-05-04 MED ORDER — KETAMINE HCL 10 MG/ML IJ SOLN
INTRAMUSCULAR | Status: DC | PRN
  Administered 2015-05-04 (×3): 10 mg via INTRAVENOUS

## 2015-05-04 MED ORDER — FENTANYL CITRATE (PF) 100 MCG/2ML IJ SOLN
INTRAMUSCULAR | Status: DC | PRN
  Administered 2015-05-04 (×2): 25 ug via INTRAVENOUS

## 2015-05-04 MED ORDER — MIDAZOLAM HCL 5 MG/5ML IJ SOLN
INTRAMUSCULAR | Status: DC | PRN
  Administered 2015-05-04: 2 mg via INTRAVENOUS

## 2015-05-04 MED ORDER — ONDANSETRON HCL 4 MG/2ML IJ SOLN
4.0000 mg | Freq: Once | INTRAMUSCULAR | Status: DC | PRN
  Filled 2015-05-04: qty 2

## 2015-05-04 MED ORDER — LIDOCAINE HCL (CARDIAC) 20 MG/ML IV SOLN
INTRAVENOUS | Status: DC | PRN
  Administered 2015-05-04: 50 mg via INTRAVENOUS

## 2015-05-04 MED ORDER — FERRIC SUBSULFATE 259 MG/GM EX SOLN
CUTANEOUS | Status: AC
  Filled 2015-05-04: qty 8

## 2015-05-04 MED ORDER — PROPOFOL 10 MG/ML IV BOLUS
INTRAVENOUS | Status: AC
  Filled 2015-05-04: qty 20

## 2015-05-04 MED ORDER — FENTANYL CITRATE (PF) 100 MCG/2ML IJ SOLN
INTRAMUSCULAR | Status: AC
  Filled 2015-05-04: qty 2

## 2015-05-04 MED ORDER — MIDAZOLAM HCL 2 MG/2ML IJ SOLN
INTRAMUSCULAR | Status: AC
  Filled 2015-05-04: qty 2

## 2015-05-04 MED ORDER — ACETIC ACID 5 % SOLN
Status: AC
  Filled 2015-05-04: qty 500

## 2015-05-04 MED ORDER — KETAMINE HCL 10 MG/ML IJ SOLN
INTRAMUSCULAR | Status: AC
  Filled 2015-05-04: qty 1

## 2015-05-04 MED ORDER — PROPOFOL 500 MG/50ML IV EMUL
INTRAVENOUS | Status: AC
  Filled 2015-05-04: qty 50

## 2015-05-04 MED ORDER — OXYCODONE-ACETAMINOPHEN 5-325 MG PO TABS
1.0000 | ORAL_TABLET | ORAL | Status: AC | PRN
  Administered 2015-05-04: 1 via ORAL
  Filled 2015-05-04: qty 2

## 2015-05-04 MED ORDER — ONDANSETRON HCL 4 MG/2ML IJ SOLN
INTRAMUSCULAR | Status: DC | PRN
  Administered 2015-05-04: 4 mg via INTRAVENOUS

## 2015-05-04 MED ORDER — DOCUSATE SODIUM 100 MG PO CAPS: 100.0000 mg | ORAL_CAPSULE | Freq: Two times a day (BID) | ORAL | Status: DC

## 2015-05-04 MED ORDER — DEXAMETHASONE SODIUM PHOSPHATE 4 MG/ML IJ SOLN
INTRAMUSCULAR | Status: DC | PRN
  Administered 2015-05-04: 10 mg via INTRAVENOUS

## 2015-05-04 MED ORDER — LIDOCAINE HCL 1 % IJ SOLN
INTRAMUSCULAR | Status: DC | PRN
  Administered 2015-05-04: 10 mL

## 2015-05-04 MED ORDER — OXYCODONE-ACETAMINOPHEN 5-325 MG PO TABS
ORAL_TABLET | ORAL | Status: AC
  Filled 2015-05-04: qty 1

## 2015-05-04 MED ORDER — SITZ BATH MISC: 1.0000 "application " | Freq: Three times a day (TID) | Status: DC

## 2015-05-04 MED ORDER — LIDOCAINE HCL 1 % IJ SOLN
INTRAMUSCULAR | Status: AC
  Filled 2015-05-04: qty 20

## 2015-05-04 MED ORDER — FENTANYL CITRATE (PF) 100 MCG/2ML IJ SOLN
25.0000 ug | INTRAMUSCULAR | Status: DC | PRN
  Filled 2015-05-04: qty 1

## 2015-05-04 MED ORDER — PROPOFOL 500 MG/50ML IV EMUL
INTRAVENOUS | Status: DC | PRN
  Administered 2015-05-04: 120 ug/kg/min via INTRAVENOUS

## 2015-05-04 MED ORDER — OXYCODONE-ACETAMINOPHEN 5-325 MG PO TABS: 1.0000 | ORAL_TABLET | ORAL | Status: DC | PRN

## 2015-05-04 MED ORDER — SILVER NITRATE-POT NITRATE 75-25 % EX MISC
CUTANEOUS | Status: AC
  Filled 2015-05-04: qty 1

## 2015-05-04 SURGICAL SUPPLY — 52 items
APPLICATOR COTTON TIP 6IN STRL (MISCELLANEOUS) IMPLANT
BLADE CLIPPER SURG (BLADE) IMPLANT
BLADE SURG 15 STRL LF DISP TIS (BLADE) ×1 IMPLANT
BLADE SURG 15 STRL SS (BLADE) ×1
BNDG GAUZE ELAST 4 BULKY (GAUZE/BANDAGES/DRESSINGS) ×2 IMPLANT
BRIEF STRETCH FOR OB PAD LRG (UNDERPADS AND DIAPERS) ×2 IMPLANT
CANISTER SUCTION 2500CC (MISCELLANEOUS) ×2 IMPLANT
CATH FOLEY 2WAY SLVR  5CC 14FR (CATHETERS)
CATH FOLEY 2WAY SLVR 5CC 14FR (CATHETERS) IMPLANT
CATH ROBINSON RED A/P 14FR (CATHETERS) IMPLANT
COVER BACK TABLE 60X90IN (DRAPES) ×2 IMPLANT
DRAPE LG THREE QUARTER DISP (DRAPES) ×2 IMPLANT
DRAPE UNDERBUTTOCKS STRL (DRAPE) ×2 IMPLANT
GAUZE SPONGE 4X4 12PLY STRL (GAUZE/BANDAGES/DRESSINGS) IMPLANT
GAUZE SPONGE 4X4 16PLY XRAY LF (GAUZE/BANDAGES/DRESSINGS) IMPLANT
GLOVE BIO SURGEON STRL SZ 6 (GLOVE) ×4 IMPLANT
GLOVE INDICATOR 6.5 STRL GRN (GLOVE) ×2 IMPLANT
GLOVE INDICATOR 7.5 STRL GRN (GLOVE) ×2 IMPLANT
GLOVE SURG SS PI 7.5 STRL IVOR (GLOVE) ×2 IMPLANT
GOWN STRL REUS W/ TWL LRG LVL3 (GOWN DISPOSABLE) ×1 IMPLANT
GOWN STRL REUS W/TWL LRG LVL3 (GOWN DISPOSABLE) ×1
GOWN STRL REUS W/TWL XL LVL3 (GOWN DISPOSABLE) ×2 IMPLANT
KIT ROOM TURNOVER WOR (KITS) ×2 IMPLANT
LEGGING LITHOTOMY PAIR STRL (DRAPES) ×2 IMPLANT
MANIFOLD NEPTUNE II (INSTRUMENTS) IMPLANT
NEEDLE HYPO 22GX1.5 SAFETY (NEEDLE) IMPLANT
NEEDLE HYPO 25X1 1.5 SAFETY (NEEDLE) ×2 IMPLANT
NS IRRIG 500ML POUR BTL (IV SOLUTION) ×2 IMPLANT
PACK BASIN DAY SURGERY FS (CUSTOM PROCEDURE TRAY) ×2 IMPLANT
PAD OB MATERNITY 4.3X12.25 (PERSONAL CARE ITEMS) ×2 IMPLANT
PENCIL BUTTON HOLSTER BLD 10FT (ELECTRODE) ×2 IMPLANT
SCOPETTES 8  STERILE (MISCELLANEOUS)
SCOPETTES 8 STERILE (MISCELLANEOUS) IMPLANT
SUT VIC AB 0 SH 27 (SUTURE) ×2 IMPLANT
SUT VIC AB 2-0 CT2 27 (SUTURE) IMPLANT
SUT VIC AB 2-0 SH 27 (SUTURE)
SUT VIC AB 2-0 SH 27X BRD (SUTURE) IMPLANT
SUT VIC AB 3-0 PS2 18 (SUTURE)
SUT VIC AB 3-0 PS2 18XBRD (SUTURE) IMPLANT
SUT VIC AB 3-0 SH 27 (SUTURE) ×3
SUT VIC AB 3-0 SH 27X BRD (SUTURE) ×3 IMPLANT
SUT VICRYL 2 0 18  UND BR (SUTURE)
SUT VICRYL 2 0 18 UND BR (SUTURE) IMPLANT
SUT VICRYL 4-0 PS2 18IN ABS (SUTURE) ×6 IMPLANT
SYR BULB IRRIGATION 50ML (SYRINGE) ×2 IMPLANT
SYRINGE CONTROL L 12CC (SYRINGE) ×2 IMPLANT
TOWEL OR 17X24 6PK STRL BLUE (TOWEL DISPOSABLE) ×4 IMPLANT
TRAY DSU PREP LF (CUSTOM PROCEDURE TRAY) ×2 IMPLANT
TUBE CONNECTING 12X1/4 (SUCTIONS) ×2 IMPLANT
UNDERPAD 30X30 INCONTINENT (UNDERPADS AND DIAPERS) IMPLANT
WATER STERILE IRR 500ML POUR (IV SOLUTION) IMPLANT
YANKAUER SUCT BULB TIP NO VENT (SUCTIONS) ×2 IMPLANT

## 2015-05-04 NOTE — Discharge Instructions (Signed)
Vulvectomy, Care After °The vulva is the external female genitalia, outside and around the vagina and pubic bone. It consists of: °· The skin on, and in front of, the pubic bone. °· The clitoris. °· The labia majora (large lips) on the outside of the vagina. °· The labia minora (small lips) around the opening of the vagina. °· The opening and the skin in and around the vagina. °A vulvectomy is the removal of the tissue of the vulva, which sometimes includes removal of the lymph nodes and tissue in the groin areas. °These discharge instructions provide you with general information on caring for yourself after you leave the hospital. It is also important that you know the warning signs of complications, so that you can seek treatment. Please read the instructions outlined below and refer to this sheet in the next few weeks. Your caregiver may also give you specific information and medicines. If you have any questions or complications after discharge, please call your caregiver. °ACTIVITY °· Rest as much as possible the first two weeks after discharge. °· Arrange to have help from family or others with your daily activities when you go home. °· Avoid heavy lifting (more than 5 pounds), pushing, or pulling. °· If you feel tired, balance your activity with rest periods. °· Follow your caregiver's instruction about climbing stairs and driving a car. °· Increase activity gradually. °· Do not exercise until you have permission from your caregiver. °LEG AND FOOT CARE °If your doctor has removed lymph nodes from your groin area, there may be an increase in swelling of your legs and feet. You can help prevent swelling by doing the following: °· Elevate your legs while sitting or lying down. °· If your caregiver has ordered special stockings, wear them according to instructions. °· Avoid standing in one place for long periods of time. °· Call the physical therapy department if you have any questions about swelling or treatment  for swelling. °· Avoid salt in your diet. It can cause fluid retention and swelling. °· Do not cross your legs, especially when sitting. °NUTRITION °· You may resume your normal diet. °· Drink 6 to 8 glasses of fluids a day. °· Eat a healthy, balanced diet including portions of food from the meat (protein), milk, fruit, vegetable, and bread groups. °· Your caregiver may recommend you take a multivitamin with iron. °ELIMINATION °· You may notice that your stream of urine is at a different angle, and may tend to spray. Using a plastic funnel may help to decrease urine spray. °· If constipation occurs, drink more liquids, and add more fruits, vegetables, and bran to your diet. You may take a mild laxative, such as Milk of Magnesia, Metamucil, or a stool softener such as Colace, with permission from your caregiver. °HYGIENE °· You may shower and wash your hair. °· Check with your caregiver about tub baths. °· Do not add any bath oils or chemicals to your bath water, after you have permission to take baths. °· While passing urine, pour water from a bottle or spray over your vulva to dilute the urine as it passes the incision (this will decrease burning and discomfort). °· Clean yourself well after moving your bowels. °· After urinating, do not wipe. Dap or pat dry with toilet paper or a dry cleath soft cloth. °· A sitz bath will help keep your perineal area clean, reduce swelling, and provide comfort. °· Avoid wearing underpants for the first 2 weeks and wear loose skirts to   allow circulation of air around the incision °· You do not need to apply dressings, salves or lotions to the wound. °· The stitches are self-dissolving and will absorb and disappear over a couple of months (it is normal to notice the knot from the stitches on toilet paper after voiding). °HOME CARE INSTRUCTIONS  °· Apply a soft ice pack (or frozen bag of peas) to your perineum (vulva) every hour in the first 48 hours after surgery. This will reduce  swelling. °· Avoid activities that involve a lot of friction between your legs. °· Avoid wearing pants or underpants in the 1st 2 weeks (skirts are preferable). °· Take your temperature twice a day and record it, especially if you feel feverish or have chills. °· Follow your caregiver's instructions about medicines, activity, and follow-up appointments after surgery. °· Do not drink alcohol while taking pain medicine. °· Change your dressing as advised by your caregiver. °· You may take over-the-counter medicine for pain, recommended by your caregiver. °· If your pain is not relieved with medicine, call your caregiver. °· Do not take aspirin because it can cause bleeding. °· Do not douche or use tampons (use a nonperfumed sanitary pad). °· Do not have sexual intercourse until your caregiver gives you permission (typically 6 weeks postoperatively). Hugging, kissing, and playful sexual activity is fine with your caregiver's permission. °· Warm sitz baths, with your caregiver's permission, are helpful to control swelling and discomfort. °· Take showers instead of baths, until your caregiver gives you permission to take baths. °· You may take a mild medicine for constipation, recommended by your caregiver. Bran foods and drinking a lot of fluids will help with constipation. °· Make sure your family understands everything about your operation and recovery. °SEEK MEDICAL CARE IF:  °· You notice swelling and redness around the wound area. °· You notice a foul smell coming from the wound or on the surgical dressing. °· You notice the wound is separating. °· You have painful or bloody urination. °· You develop nausea and vomiting. °· You develop diarrhea. °· You develop a rash. °· You have a reaction or allergy from the medicine. °· You feel dizzy or light-headed. °· You need stronger pain medicine. °SEEK IMMEDIATE MEDICAL CARE IF:  °· You develop a temperature of 102° F (38.9° C) or higher. °· You pass out. °· You develop  leg or chest pain. °· You develop abdominal pain. °· You develop shortness of breath. °· You develop bleeding from the wound area. °· You see pus in the wound area. °MAKE SURE YOU:  °· Understand these instructions. °· Will watch your condition. °· Will get help right away if you are not doing well or get worse. °Document Released: 12/04/2003 Document Revised: 09/05/2013 Document Reviewed: 03/23/2009 °[employer]® Patient Information ©2015 [employer]. This information is not intended to replace advice given to you by your health care provider. Make sure you discuss any questions you have with your health care provider. °Post Anesthesia Home Care Instructions ° °Activity: °Get plenty of rest for the remainder of the day. A responsible adult should stay with you for 24 hours following the procedure.  °For the next 24 hours, DO NOT: °-Drive a car °-Operate machinery °-Drink alcoholic beverages °-Take any medication unless instructed by your physician °-Make any legal decisions or sign important papers. ° °Meals: °Start with liquid foods such as gelatin or soup. Progress to regular foods as tolerated. Avoid greasy, spicy, heavy foods. If nausea and/or vomiting occur, drink   only clear liquids until the nausea and/or vomiting subsides. Call your physician if vomiting continues. ° °Special Instructions/Symptoms: °Your throat may feel dry or sore from the anesthesia or the breathing tube placed in your throat during surgery. If this causes discomfort, gargle with warm salt water. The discomfort should disappear within 24 hours. ° °If you had a scopolamine patch placed behind your ear for the management of post- operative nausea and/or vomiting: ° °1. The medication in the patch is effective for 72 hours, after which it should be removed.  Wrap patch in a tissue and discard in the trash. Wash hands thoroughly with soap and water. °2. You may remove the patch earlier than 72 hours if you experience unpleasant side effects  which may include dry mouth, dizziness or visual disturbances. °3. Avoid touching the patch. Wash your hands with soap and water after contact with the patch. °  ° °

## 2015-05-04 NOTE — Transfer of Care (Signed)
Last Vitals:  Filed Vitals:   05/04/15 0704  BP: 137/81  Pulse: 72  Temp: 36.6 C  Resp: 16    Immediate Anesthesia Transfer of Care Note  Patient: Stephanie Sanchez  Procedure(s) Performed: Procedure(s) (LRB): WIDE LOCAL EXCISION LEFT  VULVAR (Left)  Patient Location: PACU  Anesthesia Type:MAC   Level of Consciousness: awake, alert  and oriented  Airway & Oxygen Therapy: Patient Spontanous Breathing and Patient connected to face mask oxygen  Post-op Assessment: Report given to PACU RN and Post -op Vital signs reviewed and stable  Post vital signs: Reviewed and stable  Complications: No apparent anesthesia complications

## 2015-05-04 NOTE — Interval H&P Note (Signed)
History and Physical Interval Note:  05/04/2015 8:11 AM  Stephanie Sanchez  has presented today for surgery, with the diagnosis of vulvar lesion  The various methods of treatment have been discussed with the patient and family. After consideration of risks, benefits and other options for treatment, the patient has consented to  Procedure(s): WIDE LOCAL EXCISION LEFT  VULVAR (Left) as a surgical intervention .  The patient's history has been reviewed, patient examined, no change in status, stable for surgery.  I have reviewed the patient's chart and labs.  Questions were answered to the patient's satisfaction.     [name]

## 2015-05-04 NOTE — Anesthesia Preprocedure Evaluation (Signed)
Anesthesia Evaluation  Patient identified by MRN, date of birth, ID band Patient awake    Reviewed: Allergy & Precautions, NPO status , Patient's Chart, lab work & pertinent test results  History of Anesthesia Complications (+) PONV and history of anesthetic complications  Airway Mallampati: II  TM Distance: >3 FB Neck ROM: Full    Dental no notable dental hx. (+) Dental Advisory Given   Pulmonary neg pulmonary ROS, former smoker,    Pulmonary exam normal breath sounds clear to auscultation       Cardiovascular negative cardio ROS Normal cardiovascular exam Rhythm:Regular Rate:Normal     Neuro/Psych negative neurological ROS  negative psych ROS   GI/Hepatic Neg liver ROS, GERD  ,  Endo/Other  negative endocrine ROS  Renal/GU negative Renal ROS  negative genitourinary   Musculoskeletal  (+) Arthritis , Osteoarthritis,    Abdominal   Peds negative pediatric ROS (+)  Hematology negative hematology ROS (+)   Anesthesia Other Findings   Reproductive/Obstetrics negative OB ROS                             Anesthesia Physical Anesthesia Plan  ASA: II  Anesthesia Plan: MAC   Post-op Pain Management:    Induction: Intravenous  Airway Management Planned:   Additional Equipment:   Intra-op Plan:   Post-operative Plan: Extubation in OR  Informed Consent: I have reviewed the patients History and Physical, chart, labs and discussed the procedure including the risks, benefits and alternatives for the proposed anesthesia with the patient or authorized representative who has indicated his/her understanding and acceptance.   Dental advisory given  Plan Discussed with: CRNA  Anesthesia Plan Comments:         Anesthesia Quick Evaluation

## 2015-05-04 NOTE — Anesthesia Procedure Notes (Signed)
Procedure Name: MAC Date/Time: 05/04/2015 8:30 AM Performed by: Mechele Claude Pre-anesthesia Checklist: Patient identified, Emergency Drugs available, Suction available, Patient being monitored and Timeout performed Patient Re-evaluated:Patient Re-evaluated prior to inductionOxygen Delivery Method: Simple face mask Intubation Type: IV induction Placement Confirmation: positive ETCO2,  CO2 detector and breath sounds checked- equal and bilateral

## 2015-05-04 NOTE — Op Note (Signed)
OPERATIVE NOTE  PATIENT: Stephanie Sanchez DATE: 05/04/15   Preop Diagnosis: History of extramammary pagets of vulva, left posterior labial nodule  Postoperative Diagnosis: same  Surgery: Partial simple left partial simple vulvectomy  Surgeons:  [name], MD Assistant: none  Anesthesia: MAC   Estimated blood loss: <26ml  IVF:  200 ml   Urine output: 50 ml   Complications: None   Pathology: left posterior labia majora with marking stitch at 12 o'clock anterior, midline perineal biopsy, left introitus biopsy, right introitus biopsy  Operative findings: acetowhite changes in a ring around introitus, most consistent with lichen sclerosis, 45mm nodule subdermal on left posterior labia majora  Procedure: The patient was identified in the preoperative holding area. Informed consent was signed on the chart. Patient was seen history was reviewed and exam was performed.   The patient was then taken to the operating room and placed in the supine position with SCD hose on. General anesthesia was then induced without difficulty. She was then placed in the dorsolithotomy position. The perineum was prepped with Betadine. The vagina was prepped with Betadine. The patient was then draped after the prep was dried. A Foley catheter was inserted into the bladder under sterile conditions.  Timeout was performed the patient, procedure, antibiotic, allergy, and length of procedure. 5% acetic acid solution was applied to the perineum. The vulvar tissues were inspected for areas of acetowhite changes or leukoplakia. The lesion was identified and the marking pen was used to circumscribe the area with appropriate surgical margins. The subcuticular tissues were infiltrated with 1% lidocaine. The 15 blade scalpel was used to make an incision through the skin circumferentially as marked. The skin elipse was grasped and was separated from the underlying deep dermal tissues with the bovie device.  After the specimen had been completely resected, it was oriented and marked at 12 o'clock with a 0-vicryl suture. The bovie was used to obtain hemostasis at the surgical bed. The subcutaneous tissues were irrigated and made hemostatic.   Additional biopsies were made with the scalpel at the left and right vaginal introitus and midline perineal body to evaluate for occult pagets given patient's symptoms of pruritis. These were made hemostatic with the bovie  The deep dermal layer was approximated with 3-0vicryl mattress sutures to bring the skin edges into approximation and off tension. The wound was closed following langher's lines. The cutaneous layer was closed with interrupted 4-0 vicryl stitches and mattress sutures to ensure a tension free and hemostatic closure. The perineum was again irrigated. The foley was removed.  All instrument, suture, laparotomy, Ray-Tec, and needle counts were correct x2. The patient tolerated the procedure well and was taken recovery room in stable condition. This is Everitt Amber dictating an operative note on Ovee Pendleton.  [name], MD

## 2015-05-04 NOTE — Anesthesia Postprocedure Evaluation (Signed)
Anesthesia Post Note  Patient: Stephanie Sanchez  Procedure(s) Performed: Procedure(s) (LRB): WIDE LOCAL EXCISION LEFT  VULVAR (Left)  Patient location during evaluation: PACU Anesthesia Type: MAC Level of consciousness: awake and alert Pain management: pain level controlled Vital Signs Assessment: post-procedure vital signs reviewed and stable Respiratory status: spontaneous breathing, nonlabored ventilation, respiratory function stable and patient connected to nasal cannula oxygen Cardiovascular status: blood pressure returned to baseline and stable Postop Assessment: no signs of nausea or vomiting Anesthetic complications: no    Last Vitals:  Filed Vitals:   05/04/15 0925 05/04/15 0930  BP:    Pulse: 61 68  Temp:    Resp: 18 19    Last Pain:  Filed Vitals:   05/04/15 0930  PainSc: 3                  Amreen Raczkowski JENNETTE

## 2015-05-04 NOTE — H&P (View-Only) (Signed)
Postoperative Evaluation  Assessment:    59 y.o. 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   S/p simple partial left vulvectomy on 08/29/14.  New cystic lesion on posterior left labia majora.  Plan: 1) To OR for wide local excision left labia majora. Discussed risks of surgery (bleeding, infection, change in contour is a vulva). Discussed postoperative management of the wound and anticipated recovery. Discussed risk for wound separation. Will biopsy other sites at time of surgery to monitor for pagets given persistent itch. 2) Recommended clobetasol to use for lichen simplex if she has reemergence of itch symptoms that persist after she has healed from this surgery.     HPI:  Stephanie Sanchez is a 59 y.o. year old initially seen in consultation on 07/02/12 for extramammary pagets.  She then underwent a simple partial left vulvectomy on 99991111 without complications.  Her postoperative course was uncomplicated.  Her final pathology revealed extramammary pagets of the left lateral labia majora with inked bilateral margins and 12 o'clock margin. The biopsies from the pruritic vulva on the posterior vagina and perinal regions revealed benign hyperplasia consistent with lichen simplex chronicus.  Interval Hx: She is seen today for a surveillance exam. She reports not using clobetasol. She has occasional itch on the left. She has noticed the development of a "cyst" in the left posterior labia majora in the past month. It has decreased in size from a "kidney bean" size to now a "pea size". Tender. Increases in size when she wears pants. Strongly desires surgical removal.  Last colonoscopy 11/16  Current Outpatient Prescriptions on File Prior to Visit  Medication Sig Dispense Refill  . Ascorbic Acid (VITAMIN C) 1000 MG tablet Take 1,000 mg by mouth daily.     . Aspirin-Acetaminophen-Caffeine (EXCEDRIN PO) Take by mouth as needed.     . Cholecalciferol (VITAMIN D)  2000 UNITS tablet Take 2,000 Units by mouth daily.    . magnesium 30 MG tablet Take 30 mg by mouth daily.     . milk thistle 175 MG tablet Take 175 mg by mouth daily.    Marland Kitchen OVER THE COUNTER MEDICATION Tumeric    . tretinoin (RETIN-A) 0.1 % cream Apply topically at bedtime.     No current facility-administered medications on file prior to visit.   Allergies  Allergen Reactions  . Codeine Nausea Only and Rash    "comes out like a burn/rash"  . Codeine Itching and Nausea And Vomiting   Past Medical History  Diagnosis Date  . Gastric ulcer   . Paget's disease of vulva   . History of stomach ulcers   . Psoriasis   . 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 PONV (postoperative nausea and vomiting)   . GERD (gastroesophageal reflux disease)   . Colon polyps     hx of  . Scoliosis   . Arthritis   . History of cervical dysplasia    Past Surgical History  Procedure Laterality Date  . Vulvectomy    . Hand surgery  11/02/12    hand reconstruction at [facility]  . Abdominal hysterectomy      at age 59  . Tonsillectomy      at 59  . Bunionectomy      59 years ago  . Bladder suspension      10 years ago  . Abdominal hysterectomy    . Eye surgery Bilateral  ctaract extraction w IOL  . Vulvectomy N/A 08/29/2014    Procedure: WIDE LOCAL EXCISION OF VULVA;  Surgeon: Everitt Amber, MD;  Location: [facility];  Service: Gynecology;  Laterality: N/A;  . Vulva /perineum biopsy N/A 08/29/2014    Procedure: VULVAR BIOPSYS;  Surgeon: Everitt Amber, MD;  Location: [facility];  Service: Gynecology;  Laterality: N/A;   Family History  Problem Relation Age of Onset  . Aortic aneurysm Mother   . Thyroid cancer Daughter 45  . AAA (abdominal aortic aneurysm) Mother   . Diabetes Father   . Other Father     enlarged heart  . Arrhythmia Brother   . Heart attack Maternal Grandfather   . Arrhythmia Brother    Social History   Social History  . Marital  Status: Divorced    Spouse Name: N/A  . Number of Children: N/A  . Years of Education: N/A   Occupational History  . Not on file.   Social History Main Topics  . Smoking status: Former Smoker -- 1.00 packs/day for 5 years    Types: Cigarettes    Quit date: 10/05/1978  . Smokeless tobacco: Never Used  . Alcohol Use: 0.6 - 1.2 oz/week    1-2 Glasses of wine per week     Comment: occas  . Drug Use: No  . Sexual Activity: Not on file   Other Topics Concern  . Not on file   Social History Narrative   ** Merged History Encounter **        Review of systems: Constitutional:  She has no weight gain or weight loss. She has no fever or chills. Eyes: No blurred vision Ears, Nose, Mouth, Throat: No dizziness, headaches or changes in hearing. No mouth sores. Cardiovascular: No chest pain, palpitations or edema. Respiratory:  No shortness of breath, wheezing or cough Gastrointestinal: She has normal bowel movements without diarrhea or constipation. She denies any nausea or vomiting. She denies blood in her stool or heart burn. Genitourinary:  She denies pelvic pain, pelvic pressure or changes in her urinary function. She has no hematuria, dysuria, or incontinence. She has no irregular vaginal bleeding or vaginal discharge Musculoskeletal: Denies muscle weakness or joint pains.  Skin:  She has no skin changes, rashes or itching Neurological:  Denies dizziness or headaches. No neuropathy, no numbness or tingling. Psychiatric:  She denies depression or anxiety. Hematologic/Lymphatic:   No easy bruising or bleeding   Physical Exam: Height 5\' 3"  (1.6 m), weight 115 lb (52.164 kg). General: Well dressed, well nourished in no apparent distress.   HEENT:  Normocephalic and atraumatic, no lesions.  Extraocular muscles intact. Sclerae anicteric. Pupils equal, round, reactive. No mouth sores or ulcers. Thyroid is normal size, not nodular, midline. Skin:  No lesions or rashes. Breasts:   deferred Lungs:  deferred Cardiovascular:  deferred Abdomen:  deferred Genitourinary: incisions have healed entirely. Atrophic skin and mucosa. No visible lesions. No discharge. 1cm mobile, cystic, spherical lesion at posterior labia majora, deep to skin, no overlying mucosal abnormalities. No gross pagetoid lesions. Extremities: No cyanosis, clubbing or edema.  No calf tenderness or erythema. No palpable cords. Psychiatric: Mood and affect are appropriate. Neurological: Awake, alert and oriented x 3. Sensation is intact, no neuropathy.  Musculoskeletal: No pain, normal strength and range of motion.   [name], MD

## 2015-05-08 ENCOUNTER — Encounter (HOSPITAL_BASED_OUTPATIENT_CLINIC_OR_DEPARTMENT_OTHER): Payer: Self-pay | Admitting: Gynecologic Oncology

## 2015-05-11 ENCOUNTER — Telehealth: Payer: Self-pay | Admitting: Gynecologic Oncology

## 2015-05-11 NOTE — Telephone Encounter (Signed)
Patient notified of path report and biopsy results from surgery on 05/08/15.  Stating she is healing well.  Advised to call for any questions or concerns.  Follow up appt previously arranged

## 2015-05-16 ENCOUNTER — Encounter: Payer: Self-pay | Admitting: Gynecologic Oncology

## 2015-05-28 ENCOUNTER — Ambulatory Visit: Payer: BC Managed Care – PPO | Attending: Gynecologic Oncology | Admitting: Gynecologic Oncology

## 2015-05-28 ENCOUNTER — Encounter: Payer: Self-pay | Admitting: Gynecologic Oncology

## 2015-05-28 VITALS — BP 132/69 | HR 89 | Temp 97.8°F | Resp 18 | Ht 63.0 in | Wt 116.6 lb

## 2015-05-28 DIAGNOSIS — C519 Malignant neoplasm of vulva, unspecified: Secondary | ICD-10-CM

## 2015-05-28 DIAGNOSIS — C4499 Other specified malignant neoplasm of skin, unspecified: Secondary | ICD-10-CM

## 2015-05-28 NOTE — Patient Instructions (Signed)
Follow up with Dr Everitt Amber on October 29 2015 at 3:30 PM , call with any changes , questions or concerns.  Thank You !

## 2015-05-28 NOTE — Progress Notes (Signed)
GYN ONC FOLLOW UP  Assessment:    60 y.o. 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   S/p simple partial left vulvectomy on 08/29/14.  S/p wide local excision of left posterior labia on 05/04/15 (benign epidermal cyst).  Plan: 1) Benign pathology including negative biopsies. No intervention required at this time. Reminded patient of risk for recurrence of Pagets. Will see for followup in 6 months. 2) Recommended clobetasol to use for lichen simplex if she has reemergence of itch symptoms that persist after she has healed from this surgery.     HPI:  Stephanie Sanchez is a 37 y.o. year old initially seen in consultation on 07/02/12 for extramammary pagets.  She then underwent a simple partial left vulvectomy on 99991111 without complications.  Her postoperative course was uncomplicated.  Her final pathology revealed extramammary pagets of the left lateral labia majora with inked bilateral margins and 12 o'clock margin. The biopsies from the pruritic vulva on the posterior vagina and perinal regions revealed benign hyperplasia consistent with lichen simplex chronicus.  When I saw the patient in December, 2016 she had noticed the development of a "cyst" in the left posterior labia majora in the past month. It has decreased in size from a "kidney bean" size to now a "pea size". Tender. Increases in size when she wears pants.   Interval Hx: On 05/04/15 she went to the OR for an excision of the posterior left labia majora which revealed a benign epidermal inclusion cyst. Random biopsies from the left and right mid labia minora and perineum were negative for LS and pagets.  She has done well postop with no specific complaints.  Last colonoscopy 11/16  Current Outpatient Prescriptions on File Prior to Visit  Medication Sig Dispense Refill  . Ascorbic Acid (VITAMIN C) 1000 MG tablet Take 1,000 mg by mouth daily.     . Cholecalciferol (VITAMIN D) 2000 UNITS tablet  Take 2,000 Units by mouth daily.    Marland Kitchen estradiol (MINIVELLE) 0.05 MG/24HR patch twice a week. patch    . magnesium 30 MG tablet Take 30 mg by mouth daily.     . milk thistle 175 MG tablet Take 175 mg by mouth daily.    . TURMERIC PO Take 1 capsule by mouth daily.     . Aspirin-Acetaminophen-Caffeine (EXCEDRIN PO) Take by mouth as needed. Reported on 05/28/2015    . lidocaine (LIDODERM) 5 % Reported on 05/28/2015    . tretinoin (RETIN-A) 0.1 % cream Apply topically at bedtime. Reported on 05/28/2015     No current facility-administered medications on file prior to visit.   Allergies  Allergen Reactions  . Codeine Itching, Nausea And Vomiting and Rash    "comes out like a burn/rash"   Past Medical History  Diagnosis Date  . Psoriasis   . PONV (postoperative nausea and vomiting)   . GERD (gastroesophageal reflux disease)   . Scoliosis   . Arthritis   . History of cervical dysplasia   . History of gastric ulcer     2013  . History of colon polyps   . History of squamous cell carcinoma excision     left elbow  . Vulvar lesion   . Paget's disease of vulva     first dx 2013   Past Surgical History  Procedure Laterality Date  . Hand surgery Right 11/02/12    hand reconstruction at [facility]  . Bunionectomy  2008  . Bladder suspension  2008  sling  . Vulvectomy N/A 08/29/2014    Procedure: WIDE LOCAL EXCISION OF VULVA;  Surgeon: Everitt Amber, MD;  Location: [place];  Service: Gynecology;  Laterality: N/A;  . Vulva /perineum biopsy N/A 08/29/2014    Procedure: VULVAR BIOPSYS;  Surgeon: Everitt Amber, MD;  Location: [facility];  Service: Gynecology;  Laterality: N/A;  . Cataract extraction w/ intraocular lens  implant, bilateral    . Vulvectomy  2013  . Tonsillectomy  1973  age 14  . Cardiovascular stress test  09-30-2013   dr Marlou Porch    normal nuclear study/  no ischemia/  normal LV function and wall motion,  ef 61%  . Vaginal hysterectomy  1988  age 78  .  Vulvectomy N/A 05/04/2015    Procedure: WIDE LOCAL EXCISION LEFT  VULVAR WITH BIOPSY OF RIGHT VULVA;  Surgeon: Everitt Amber, MD;  Location: [place];  Service: Gynecology;  Laterality: N/A;   Family History  Problem Relation Age of Onset  . Aortic aneurysm Mother   . Thyroid cancer Daughter 29  . AAA (abdominal aortic aneurysm) Mother   . Diabetes Father   . Other Father     enlarged heart  . Arrhythmia Brother   . Heart attack Maternal Grandfather   . Arrhythmia Brother    Social History   Social History  . Marital Status: Divorced    Spouse Name: N/A  . Number of Children: N/A  . Years of Education: N/A   Occupational History  . Not on file.   Social History Main Topics  . Smoking status: Former Smoker -- 1.00 packs/day for 5 years    Types: Cigarettes    Quit date: 10/05/1978  . Smokeless tobacco: Never Used  . Alcohol Use: 0.6 - 1.2 oz/week    1-2 Glasses of wine per week     Comment: occas  . Drug Use: No  . Sexual Activity: Not on file   Other Topics Concern  . Not on file   Social History Narrative   ** Merged History Encounter **        Review of systems: Constitutional:  She has no weight gain or weight loss. She has no fever or chills. Eyes: No blurred vision Ears, Nose, Mouth, Throat: No dizziness, headaches or changes in hearing. No mouth sores. Cardiovascular: No chest pain, palpitations or edema. Respiratory:  No shortness of breath, wheezing or cough Gastrointestinal: She has normal bowel movements without diarrhea or constipation. She denies any nausea or vomiting. She denies blood in her stool or heart burn. Genitourinary:  She denies pelvic pain, pelvic pressure or changes in her urinary function. She has no hematuria, dysuria, or incontinence. She has no irregular vaginal bleeding or vaginal discharge Musculoskeletal: Denies muscle weakness or joint pains.  Skin:  She has no skin changes, rashes or itching Neurological:  Denies  dizziness or headaches. No neuropathy, no numbness or tingling. Psychiatric:  She denies depression or anxiety. Hematologic/Lymphatic:   No easy bruising or bleeding   Physical Exam: Blood pressure 132/69, pulse 89, temperature 97.8 F (36.6 C), temperature source Oral, resp. rate 18, height 5\' 3"  (1.6 m), weight 116 lb 9.6 oz (52.889 kg), SpO2 99 %. General: Well dressed, well nourished in no apparent distress.   HEENT:  Normocephalic and atraumatic, no lesions.  Extraocular muscles intact. Sclerae anicteric. Pupils equal, round, reactive. No mouth sores or ulcers. Thyroid is normal size, not nodular, midline. Skin:  No lesions or rashes.  Breasts:  deferred Lungs:  deferred Cardiovascular:  deferred Abdomen:  deferred Genitourinary: incisions have healed entirely. Atrophic skin and mucosa. No visible lesions. No discharge. No gross pagetoid lesions. Incision healing well with small amount of purulent drainage with expression but no cellulitis. Extremities: No cyanosis, clubbing or edema.  No calf tenderness or erythema. No palpable cords. Psychiatric: Mood and affect are appropriate. Neurological: Awake, alert and oriented x 3. Sensation is intact, no neuropathy.  Musculoskeletal: No pain, normal strength and range of motion.   [name], MD

## 2015-06-04 ENCOUNTER — Ambulatory Visit: Payer: BC Managed Care – PPO | Admitting: Gynecologic Oncology

## 2015-09-13 ENCOUNTER — Ambulatory Visit: Payer: Self-pay | Admitting: Gynecologic Oncology

## 2015-10-29 ENCOUNTER — Ambulatory Visit: Payer: Self-pay | Admitting: Gynecologic Oncology

## 2016-01-14 ENCOUNTER — Ambulatory Visit: Payer: BC Managed Care – PPO | Attending: Gynecologic Oncology | Admitting: Gynecologic Oncology

## 2016-01-14 ENCOUNTER — Encounter: Payer: Self-pay | Admitting: Gynecologic Oncology

## 2016-01-14 DIAGNOSIS — C4499 Other specified malignant neoplasm of skin, unspecified: Secondary | ICD-10-CM

## 2016-01-14 DIAGNOSIS — Z86010 Personal history of colonic polyps: Secondary | ICD-10-CM | POA: Diagnosis not present

## 2016-01-14 DIAGNOSIS — Z8719 Personal history of other diseases of the digestive system: Secondary | ICD-10-CM | POA: Insufficient documentation

## 2016-01-14 DIAGNOSIS — Z9889 Other specified postprocedural states: Secondary | ICD-10-CM | POA: Insufficient documentation

## 2016-01-14 DIAGNOSIS — Z888 Allergy status to other drugs, medicaments and biological substances status: Secondary | ICD-10-CM | POA: Diagnosis not present

## 2016-01-14 DIAGNOSIS — Z808 Family history of malignant neoplasm of other organs or systems: Secondary | ICD-10-CM | POA: Insufficient documentation

## 2016-01-14 DIAGNOSIS — Z87891 Personal history of nicotine dependence: Secondary | ICD-10-CM | POA: Diagnosis not present

## 2016-01-14 DIAGNOSIS — Z7982 Long term (current) use of aspirin: Secondary | ICD-10-CM | POA: Insufficient documentation

## 2016-01-14 DIAGNOSIS — M419 Scoliosis, unspecified: Secondary | ICD-10-CM | POA: Insufficient documentation

## 2016-01-14 DIAGNOSIS — C519 Malignant neoplasm of vulva, unspecified: Secondary | ICD-10-CM | POA: Diagnosis present

## 2016-01-14 DIAGNOSIS — L280 Lichen simplex chronicus: Secondary | ICD-10-CM | POA: Insufficient documentation

## 2016-01-14 DIAGNOSIS — L409 Psoriasis, unspecified: Secondary | ICD-10-CM | POA: Insufficient documentation

## 2016-01-14 DIAGNOSIS — M1990 Unspecified osteoarthritis, unspecified site: Secondary | ICD-10-CM | POA: Insufficient documentation

## 2016-01-14 DIAGNOSIS — Z79899 Other long term (current) drug therapy: Secondary | ICD-10-CM | POA: Insufficient documentation

## 2016-01-14 DIAGNOSIS — K219 Gastro-esophageal reflux disease without esophagitis: Secondary | ICD-10-CM | POA: Diagnosis not present

## 2016-01-14 NOTE — Patient Instructions (Signed)
Plan to have a wide local excision on January 22, 2016 at the [facility].  You will receive a phone call from the pre-surgical RN several days before your procedure to discuss instructions.

## 2016-01-17 ENCOUNTER — Encounter: Payer: Self-pay | Admitting: *Deleted

## 2016-01-17 ENCOUNTER — Encounter (HOSPITAL_BASED_OUTPATIENT_CLINIC_OR_DEPARTMENT_OTHER): Payer: Self-pay | Admitting: *Deleted

## 2016-01-17 ENCOUNTER — Telehealth: Payer: Self-pay | Admitting: *Deleted

## 2016-01-17 NOTE — Progress Notes (Signed)
NPO AFTER MN.  ARRIVE AT 0830.  NEEDS HG. 

## 2016-01-17 NOTE — Telephone Encounter (Signed)
Received patient's FMLA paper work on 01/15/2016. Forms have been completed. Pt wants forms faxed and a copy mailed to her home address. Forms have been faxed to [employer] @[phone]  A copy was also sent to HIM

## 2016-01-18 ENCOUNTER — Encounter: Payer: Self-pay | Admitting: Gynecologic Oncology

## 2016-01-18 NOTE — Progress Notes (Signed)
GYN ONC FOLLOW UP  Assessment:    60 y.o. 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   S/p simple partial left vulvectomy on 08/29/14.  S/p wide local excision of left posterior labia on 05/04/15 (benign epidermal cyst).  Plan: Will return to OR for resection of left labia majora. Discussed that we will remove most of the labia majora which will be associated with some disfigurement, but unlikely to have sexual function impairment. Discussed risks of procedure including infection, wound separation, pain. Discussed high rate of recurrence with pagets.     HPI:  Stephanie Sanchez is a 67 y.o. year old initially seen in consultation on 07/02/12 for extramammary pagets.  She then underwent a simple partial left vulvectomy on 99991111 without complications.  Her postoperative course was uncomplicated.  Her final pathology revealed extramammary pagets of the left lateral labia majora with inked bilateral margins and 12 o'clock margin. The biopsies from the pruritic vulva on the posterior vagina and perinal regions revealed benign hyperplasia consistent with lichen simplex chronicus.  When I saw the patient in December, 2016 she had noticed the development of a "cyst" in the left posterior labia majora in the past month. It has decreased in size from a "kidney bean" size to now a "pea size". Tender. Increases in size when she wears pants.   On 05/04/15 she went to the OR for an excision of the posterior left labia majora which revealed a benign epidermal inclusion cyst. Random biopsies from the left and right mid labia minora and perineum were negative for LS and pagets.  Interval Hx:  Biopsy performed on 8/24/17by Dr Helane Rima  of left labia majora showed extramammary pagets. .  Last colonoscopy 11/16  Current Outpatient Prescriptions on File Prior to Visit  Medication Sig Dispense Refill  . Ascorbic Acid (VITAMIN C) 1000 MG tablet Take 1,000  mg by mouth daily.     . Cholecalciferol (VITAMIN D) 2000 UNITS tablet Take 2,000 Units by mouth daily.    Marland Kitchen estradiol (MINIVELLE) 0.05 MG/24HR patch twice a week. patch    . magnesium 30 MG tablet Take 30 mg by mouth daily.     . TURMERIC PO Take 1 capsule by mouth daily.     . Aspirin-Acetaminophen-Caffeine (EXCEDRIN PO) Take by mouth as needed. Reported on 05/28/2015    . lidocaine (LIDODERM) 5 % Reported on 05/28/2015     No current facility-administered medications on file prior to visit.    Allergies  Allergen Reactions  . Codeine Itching, Nausea And Vomiting and Rash    "comes out like a burn/rash"   Past Medical History:  Diagnosis Date  . Arthritis   . GERD (gastroesophageal reflux disease)   . History of cervical dysplasia   . History of colon polyps   . History of gastric ulcer    2013  . History of squamous cell carcinoma excision    left elbow  . Lichen simplex chronicus   . Paget's disease of vulva    first dx 2013  . PONV (postoperative nausea and vomiting)   . Psoriasis   . Scoliosis    Past Surgical History:  Procedure Laterality Date  . BLADDER SUSPENSION  2008   sling  . BUNIONECTOMY  2008  . CARDIOVASCULAR STRESS TEST  09-30-2013   dr Marlou Porch   normal nuclear study/  no ischemia/  normal LV function and wall motion,  ef 61%  . CATARACT  EXTRACTION W/ INTRAOCULAR LENS  IMPLANT, BILATERAL    . HAND SURGERY Right 11/02/12   hand reconstruction at [facility]  . TONSILLECTOMY  80  age 69  . VAGINAL HYSTERECTOMY  69  age 59  . VULVA Milagros Loll BIOPSY N/A 08/29/2014   Procedure: Georgena Spurling;  Surgeon: Everitt Amber, MD;  Location: [facility];  Service: Gynecology;  Laterality: N/A;  . VULVECTOMY N/A 08/29/2014   Procedure: WIDE LOCAL EXCISION OF VULVA;  Surgeon: Everitt Amber, MD;  Location: [place];  Service: Gynecology;  Laterality: N/A;  . VULVECTOMY  2013  . VULVECTOMY N/A 05/04/2015   Procedure: WIDE LOCAL EXCISION LEFT   VULVAR WITH BIOPSY OF RIGHT VULVA;  Surgeon: Everitt Amber, MD;  Location: [place];  Service: Gynecology;  Laterality: N/A;   Family History  Problem Relation Age of Onset  . Aortic aneurysm Mother   . AAA (abdominal aortic aneurysm) Mother   . Diabetes Father   . Other Father     enlarged heart  . Arrhythmia Brother   . Heart attack Maternal Grandfather   . Arrhythmia Brother   . Thyroid cancer Daughter 23   Social History   Social History  . Marital status: Divorced    Spouse name: N/A  . Number of children: N/A  . Years of education: N/A   Occupational History  . Not on file.   Social History Main Topics  . Smoking status: Former Smoker    Packs/day: 1.00    Years: 5.00    Types: Cigarettes    Quit date: 10/05/1978  . Smokeless tobacco: Never Used  . Alcohol use 0.6 - 1.2 oz/week    1 - 2 Glasses of wine per week     Comment: occas  . Drug use: No  . Sexual activity: Not on file   Other Topics Concern  . Not on file   Social History Narrative   ** Merged History Encounter **        Review of systems: Constitutional:  She has no weight gain or weight loss. She has no fever or chills. Eyes: No blurred vision Ears, Nose, Mouth, Throat: No dizziness, headaches or changes in hearing. No mouth sores. Cardiovascular: No chest pain, palpitations or edema. Respiratory:  No shortness of breath, wheezing or cough Gastrointestinal: She has normal bowel movements without diarrhea or constipation. She denies any nausea or vomiting. She denies blood in her stool or heart burn. Genitourinary:  She denies pelvic pain, pelvic pressure or changes in her urinary function. She has no hematuria, dysuria, or incontinence. She has no irregular vaginal bleeding or vaginal discharge Musculoskeletal: Denies muscle weakness or joint pains.  Skin:  She has no skin changes, rashes or itching Neurological:  Denies dizziness or headaches. No neuropathy, no numbness or  tingling. Psychiatric:  She denies depression or anxiety. Hematologic/Lymphatic:   No easy bruising or bleeding   Physical Exam: Blood pressure (!) (P) 134/97, pulse (P) 81, temperature (P) 98.1 F (36.7 C), temperature source (P) Oral, resp. rate (P) 18, height (P) 5\' 3"  (1.6 m), weight (P) 120 lb 1.6 oz (54.5 kg), SpO2 (P) 99 %. General: Well dressed, well nourished in no apparent distress.   HEENT:  Normocephalic and atraumatic, no lesions.  Extraocular muscles intact. Sclerae anicteric. Pupils equal, round, reactive. No mouth sores or ulcers. Thyroid is normal size, not nodular, midline. Skin:  No lesions or rashes. Breasts:  deferred Lungs:  deferred Cardiovascular:  deferred Abdomen:  deferred Genitourinary:Pagetoid lesion on length of left labia majora  Incision healing well with small amount of purulent drainage with expression but no cellulitis. Extremities: No cyanosis, clubbing or edema.  No calf tenderness or erythema. No palpable cords. Psychiatric: Mood and affect are appropriate. Neurological: Awake, alert and oriented x 3. Sensation is intact, no neuropathy.  Musculoskeletal: No pain, normal strength and range of motion.   [name], MD

## 2016-01-22 ENCOUNTER — Ambulatory Visit (HOSPITAL_BASED_OUTPATIENT_CLINIC_OR_DEPARTMENT_OTHER): Payer: BC Managed Care – PPO | Admitting: Anesthesiology

## 2016-01-22 ENCOUNTER — Encounter (HOSPITAL_BASED_OUTPATIENT_CLINIC_OR_DEPARTMENT_OTHER): Payer: Self-pay | Admitting: *Deleted

## 2016-01-22 ENCOUNTER — Encounter (HOSPITAL_BASED_OUTPATIENT_CLINIC_OR_DEPARTMENT_OTHER)
Admission: RE | Disposition: A | Discharge status: Home / Self Care | Payer: Self-pay | Source: Ambulatory Visit | Attending: Gynecologic Oncology

## 2016-01-22 ENCOUNTER — Ambulatory Visit (HOSPITAL_BASED_OUTPATIENT_CLINIC_OR_DEPARTMENT_OTHER)
Admission: RE | Admit: 2016-01-22 | Discharge: 2016-01-22 | Disposition: A | Discharge status: Home / Self Care | Payer: BC Managed Care – PPO | Source: Ambulatory Visit | Attending: Gynecologic Oncology | Admitting: Gynecologic Oncology

## 2016-01-22 DIAGNOSIS — Z79899 Other long term (current) drug therapy: Secondary | ICD-10-CM | POA: Diagnosis not present

## 2016-01-22 DIAGNOSIS — C519 Malignant neoplasm of vulva, unspecified: Secondary | ICD-10-CM

## 2016-01-22 DIAGNOSIS — Z87891 Personal history of nicotine dependence: Secondary | ICD-10-CM | POA: Diagnosis not present

## 2016-01-22 DIAGNOSIS — Z79890 Hormone replacement therapy (postmenopausal): Secondary | ICD-10-CM | POA: Insufficient documentation

## 2016-01-22 DIAGNOSIS — C4499 Other specified malignant neoplasm of skin, unspecified: Secondary | ICD-10-CM

## 2016-01-22 DIAGNOSIS — N949 Unspecified condition associated with female genital organs and menstrual cycle: Secondary | ICD-10-CM | POA: Diagnosis present

## 2016-01-22 DIAGNOSIS — C510 Malignant neoplasm of labium majus: Secondary | ICD-10-CM | POA: Diagnosis not present

## 2016-01-22 HISTORY — DX: Lichen simplex chronicus: L28.0

## 2016-01-22 HISTORY — PX: VULVECTOMY: SHX1086

## 2016-01-22 LAB — POCT HEMOGLOBIN-HEMACUE: Hemoglobin: 15.8 g/dL — ABNORMAL HIGH (ref 12.0–15.0)

## 2016-01-22 SURGERY — WIDE EXCISION VULVECTOMY
Anesthesia: Monitor Anesthesia Care | Site: Vulva | Laterality: Left

## 2016-01-22 MED ORDER — ACETAMINOPHEN 10 MG/ML IV SOLN
INTRAVENOUS | Status: AC
Start: 2016-01-22 — End: 2016-01-22
  Filled 2016-01-22: qty 100

## 2016-01-22 MED ORDER — EPHEDRINE SULFATE 50 MG/ML IJ SOLN
INTRAMUSCULAR | Status: DC | PRN
  Administered 2016-01-22: 10 mg via INTRAVENOUS

## 2016-01-22 MED ORDER — OXYCODONE HCL 5 MG PO TABS
5.0000 mg | ORAL_TABLET | Freq: Once | ORAL | Status: DC | PRN
  Filled 2016-01-22: qty 1

## 2016-01-22 MED ORDER — HYDROMORPHONE HCL 2 MG PO TABS
2.0000 mg | ORAL_TABLET | ORAL | Status: DC | PRN
  Filled 2016-01-22: qty 1

## 2016-01-22 MED ORDER — PROPOFOL 500 MG/50ML IV EMUL
INTRAVENOUS | Status: DC | PRN
  Administered 2016-01-22: 150 ug/kg/min via INTRAVENOUS

## 2016-01-22 MED ORDER — MIDAZOLAM HCL 5 MG/5ML IJ SOLN
INTRAMUSCULAR | Status: DC | PRN
  Administered 2016-01-22: 2 mg via INTRAVENOUS

## 2016-01-22 MED ORDER — KETAMINE HCL 10 MG/ML IJ SOLN
INTRAMUSCULAR | Status: AC
Start: 2016-01-22 — End: 2016-01-22
  Filled 2016-01-22: qty 1

## 2016-01-22 MED ORDER — KETAMINE HCL 10 MG/ML IJ SOLN
INTRAMUSCULAR | Status: DC | PRN
  Administered 2016-01-22: 30 mg via INTRAVENOUS

## 2016-01-22 MED ORDER — SCOPOLAMINE 1 MG/3DAYS TD PT72
MEDICATED_PATCH | TRANSDERMAL | Status: DC | PRN
  Administered 2016-01-22: 1 via TRANSDERMAL

## 2016-01-22 MED ORDER — DEXAMETHASONE SODIUM PHOSPHATE 4 MG/ML IJ SOLN
INTRAMUSCULAR | Status: DC | PRN
  Administered 2016-01-22: 10 mg via INTRAVENOUS

## 2016-01-22 MED ORDER — ACETAMINOPHEN 160 MG/5ML PO SOLN
325.0000 mg | ORAL | Status: DC | PRN
  Filled 2016-01-22: qty 20.3

## 2016-01-22 MED ORDER — DEXAMETHASONE SODIUM PHOSPHATE 10 MG/ML IJ SOLN
INTRAMUSCULAR | Status: AC
  Filled 2016-01-22: qty 1

## 2016-01-22 MED ORDER — KETOROLAC TROMETHAMINE 30 MG/ML IJ SOLN
INTRAMUSCULAR | Status: AC
  Filled 2016-01-22: qty 1

## 2016-01-22 MED ORDER — ACETAMINOPHEN 10 MG/ML IV SOLN
INTRAVENOUS | Status: DC | PRN
  Administered 2016-01-22: 1000 mg via INTRAVENOUS

## 2016-01-22 MED ORDER — LACTATED RINGERS IV SOLN
INTRAVENOUS | Status: DC
  Administered 2016-01-22 (×2): via INTRAVENOUS
  Filled 2016-01-22: qty 1000

## 2016-01-22 MED ORDER — SENNA 8.6 MG PO TABS: 1.0000 | ORAL_TABLET | Freq: Every day | ORAL | 0 refills | Status: DC

## 2016-01-22 MED ORDER — SCOPOLAMINE 1 MG/3DAYS TD PT72
MEDICATED_PATCH | TRANSDERMAL | Status: AC
  Filled 2016-01-22: qty 1

## 2016-01-22 MED ORDER — FENTANYL CITRATE (PF) 100 MCG/2ML IJ SOLN
INTRAMUSCULAR | Status: AC
  Filled 2016-01-22: qty 2

## 2016-01-22 MED ORDER — FENTANYL CITRATE (PF) 100 MCG/2ML IJ SOLN
25.0000 ug | INTRAMUSCULAR | Status: DC | PRN
  Filled 2016-01-22: qty 1

## 2016-01-22 MED ORDER — PROPOFOL 10 MG/ML IV BOLUS
INTRAVENOUS | Status: AC
  Filled 2016-01-22: qty 20

## 2016-01-22 MED ORDER — ONDANSETRON HCL 4 MG/2ML IJ SOLN
INTRAMUSCULAR | Status: DC | PRN
  Administered 2016-01-22: 4 mg via INTRAVENOUS

## 2016-01-22 MED ORDER — FENTANYL CITRATE (PF) 100 MCG/2ML IJ SOLN
INTRAMUSCULAR | Status: DC | PRN
  Administered 2016-01-22 (×2): 25 ug via INTRAVENOUS

## 2016-01-22 MED ORDER — HYDROMORPHONE HCL 2 MG PO TABS: 2.0000 mg | ORAL_TABLET | ORAL | 0 refills | Status: DC | PRN

## 2016-01-22 MED ORDER — KETOROLAC TROMETHAMINE 30 MG/ML IJ SOLN
INTRAMUSCULAR | Status: DC | PRN
  Administered 2016-01-22: 30 mg via INTRAVENOUS

## 2016-01-22 MED ORDER — PROPOFOL 10 MG/ML IV BOLUS
INTRAVENOUS | Status: DC | PRN
  Administered 2016-01-22: 20 mg via INTRAVENOUS

## 2016-01-22 MED ORDER — PROPOFOL 500 MG/50ML IV EMUL
INTRAVENOUS | Status: AC
  Filled 2016-01-22: qty 50

## 2016-01-22 MED ORDER — BUPIVACAINE HCL 0.5 % IJ SOLN
INTRAMUSCULAR | Status: DC | PRN
  Administered 2016-01-22: 20 mL

## 2016-01-22 MED ORDER — OXYCODONE HCL 5 MG/5ML PO SOLN
5.0000 mg | Freq: Once | ORAL | Status: DC | PRN
  Filled 2016-01-22: qty 5

## 2016-01-22 MED ORDER — LIDOCAINE 2% (20 MG/ML) 5 ML SYRINGE
INTRAMUSCULAR | Status: AC
  Filled 2016-01-22: qty 5

## 2016-01-22 MED ORDER — MIDAZOLAM HCL 2 MG/2ML IJ SOLN
INTRAMUSCULAR | Status: AC
  Filled 2016-01-22: qty 2

## 2016-01-22 MED ORDER — LIDOCAINE 2% (20 MG/ML) 5 ML SYRINGE
INTRAMUSCULAR | Status: DC | PRN
  Administered 2016-01-22: 60 mg via INTRAVENOUS

## 2016-01-22 MED ORDER — EPHEDRINE 5 MG/ML INJ
INTRAVENOUS | Status: AC
  Filled 2016-01-22: qty 10

## 2016-01-22 MED ORDER — ACETAMINOPHEN 325 MG PO TABS
325.0000 mg | ORAL_TABLET | ORAL | Status: DC | PRN
  Filled 2016-01-22: qty 2

## 2016-01-22 MED ORDER — ONDANSETRON HCL 4 MG/2ML IJ SOLN
INTRAMUSCULAR | Status: AC
  Filled 2016-01-22: qty 2

## 2016-01-22 SURGICAL SUPPLY — 32 items
BLADE SURG 15 STRL LF DISP TIS (BLADE) ×1 IMPLANT
BLADE SURG 15 STRL SS (BLADE) ×2
BNDG GAUZE ELAST 4 BULKY (GAUZE/BANDAGES/DRESSINGS) ×2 IMPLANT
BRIEF STRETCH FOR OB PAD LRG (UNDERPADS AND DIAPERS) ×2 IMPLANT
CANISTER SUCTION 2500CC (MISCELLANEOUS) ×2 IMPLANT
CATH ROBINSON RED A/P 14FR (CATHETERS) ×2 IMPLANT
COVER BACK TABLE 60X90IN (DRAPES) ×2 IMPLANT
DRAPE LG THREE QUARTER DISP (DRAPES) ×2 IMPLANT
DRAPE UNDERBUTTOCKS STRL (DRAPE) ×2 IMPLANT
ELECT REM PT RETURN 9FT ADLT (ELECTROSURGICAL) ×2
ELECTRODE REM PT RTRN 9FT ADLT (ELECTROSURGICAL) ×1 IMPLANT
GAUZE SPONGE 4X4 16PLY XRAY LF (GAUZE/BANDAGES/DRESSINGS) ×2 IMPLANT
GLOVE BIO SURGEON STRL SZ 6 (GLOVE) ×4 IMPLANT
KIT ROOM TURNOVER WOR (KITS) ×2 IMPLANT
LEGGING LITHOTOMY PAIR STRL (DRAPES) ×2 IMPLANT
NEEDLE HYPO 25X1 1.5 SAFETY (NEEDLE) ×2 IMPLANT
NS IRRIG 500ML POUR BTL (IV SOLUTION) ×2 IMPLANT
PACK BASIN DAY SURGERY FS (CUSTOM PROCEDURE TRAY) ×2 IMPLANT
PAD PREP 24X48 CUFFED NSTRL (MISCELLANEOUS) ×2 IMPLANT
PENCIL BUTTON HOLSTER BLD 10FT (ELECTRODE) ×2 IMPLANT
SUT VIC AB 0 SH 27 (SUTURE) ×2 IMPLANT
SUT VIC AB 2-0 SH 27 (SUTURE) ×1
SUT VIC AB 2-0 SH 27XBRD (SUTURE) ×1 IMPLANT
SUT VIC AB 3-0 SH 27 (SUTURE) ×10
SUT VIC AB 3-0 SH 27X BRD (SUTURE) ×5 IMPLANT
SUT VICRYL 4-0 PS2 18IN ABS (SUTURE) ×12 IMPLANT
SYR BULB IRRIGATION 50ML (SYRINGE) ×2 IMPLANT
SYR CONTROL 10ML LL (SYRINGE) ×2 IMPLANT
TOWEL OR 17X24 6PK STRL BLUE (TOWEL DISPOSABLE) ×2 IMPLANT
TRAY DSU PREP LF (CUSTOM PROCEDURE TRAY) ×2 IMPLANT
TUBE CONNECTING 12X1/4 (SUCTIONS) ×2 IMPLANT
YANKAUER SUCT BULB TIP NO VENT (SUCTIONS) ×2 IMPLANT

## 2016-01-22 NOTE — H&P (View-Only) (Signed)
GYN ONC FOLLOW UP  Assessment:    60 y.o. 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   S/p simple partial left vulvectomy on 08/29/14.  S/p wide local excision of left posterior labia on 05/04/15 (benign epidermal cyst).  Plan: Will return to OR for resection of left labia majora. Discussed that we will remove most of the labia majora which will be associated with some disfigurement, but unlikely to have sexual function impairment. Discussed risks of procedure including infection, wound separation, pain. Discussed high rate of recurrence with pagets.     HPI:  Stephanie Sanchez is a 44 y.o. year old initially seen in consultation on 07/02/12 for extramammary pagets.  She then underwent a simple partial left vulvectomy on 99991111 without complications.  Her postoperative course was uncomplicated.  Her final pathology revealed extramammary pagets of the left lateral labia majora with inked bilateral margins and 12 o'clock margin. The biopsies from the pruritic vulva on the posterior vagina and perinal regions revealed benign hyperplasia consistent with lichen simplex chronicus.  When I saw the patient in December, 2016 she had noticed the development of a "cyst" in the left posterior labia majora in the past month. It has decreased in size from a "kidney bean" size to now a "pea size". Tender. Increases in size when she wears pants.   On 05/04/15 she went to the OR for an excision of the posterior left labia majora which revealed a benign epidermal inclusion cyst. Random biopsies from the left and right mid labia minora and perineum were negative for LS and pagets.  Interval Hx:  Biopsy performed on 8/24/17by Dr Helane Rima  of left labia majora showed extramammary pagets. .  Last colonoscopy 11/16  Current Outpatient Prescriptions on File Prior to Visit  Medication Sig Dispense Refill  . Ascorbic Acid (VITAMIN C) 1000 MG tablet Take 1,000  mg by mouth daily.     . Cholecalciferol (VITAMIN D) 2000 UNITS tablet Take 2,000 Units by mouth daily.    Marland Kitchen estradiol (MINIVELLE) 0.05 MG/24HR patch twice a week. patch    . magnesium 30 MG tablet Take 30 mg by mouth daily.     . TURMERIC PO Take 1 capsule by mouth daily.     . Aspirin-Acetaminophen-Caffeine (EXCEDRIN PO) Take by mouth as needed. Reported on 05/28/2015    . lidocaine (LIDODERM) 5 % Reported on 05/28/2015     No current facility-administered medications on file prior to visit.    Allergies  Allergen Reactions  . Codeine Itching, Nausea And Vomiting and Rash    "comes out like a burn/rash"   Past Medical History:  Diagnosis Date  . Arthritis   . GERD (gastroesophageal reflux disease)   . History of cervical dysplasia   . History of colon polyps   . History of gastric ulcer    2013  . History of squamous cell carcinoma excision    left elbow  . Lichen simplex chronicus   . Paget's disease of vulva    first dx 2013  . PONV (postoperative nausea and vomiting)   . Psoriasis   . Scoliosis    Past Surgical History:  Procedure Laterality Date  . BLADDER SUSPENSION  2008   sling  . BUNIONECTOMY  2008  . CARDIOVASCULAR STRESS TEST  09-30-2013   dr Marlou Porch   normal nuclear study/  no ischemia/  normal LV function and wall motion,  ef 61%  . CATARACT  EXTRACTION W/ INTRAOCULAR LENS  IMPLANT, BILATERAL    . HAND SURGERY Right 11/02/12   hand reconstruction at [facility]  . TONSILLECTOMY  80  age 69  . VAGINAL HYSTERECTOMY  69  age 59  . VULVA Milagros Loll BIOPSY N/A 08/29/2014   Procedure: Georgena Spurling;  Surgeon: Everitt Amber, MD;  Location: [facility];  Service: Gynecology;  Laterality: N/A;  . VULVECTOMY N/A 08/29/2014   Procedure: WIDE LOCAL EXCISION OF VULVA;  Surgeon: Everitt Amber, MD;  Location: [place];  Service: Gynecology;  Laterality: N/A;  . VULVECTOMY  2013  . VULVECTOMY N/A 05/04/2015   Procedure: WIDE LOCAL EXCISION LEFT   VULVAR WITH BIOPSY OF RIGHT VULVA;  Surgeon: Everitt Amber, MD;  Location: [place];  Service: Gynecology;  Laterality: N/A;   Family History  Problem Relation Age of Onset  . Aortic aneurysm Mother   . AAA (abdominal aortic aneurysm) Mother   . Diabetes Father   . Other Father     enlarged heart  . Arrhythmia Brother   . Heart attack Maternal Grandfather   . Arrhythmia Brother   . Thyroid cancer Daughter 23   Social History   Social History  . Marital status: Divorced    Spouse name: N/A  . Number of children: N/A  . Years of education: N/A   Occupational History  . Not on file.   Social History Main Topics  . Smoking status: Former Smoker    Packs/day: 1.00    Years: 5.00    Types: Cigarettes    Quit date: 10/05/1978  . Smokeless tobacco: Never Used  . Alcohol use 0.6 - 1.2 oz/week    1 - 2 Glasses of wine per week     Comment: occas  . Drug use: No  . Sexual activity: Not on file   Other Topics Concern  . Not on file   Social History Narrative   ** Merged History Encounter **        Review of systems: Constitutional:  She has no weight gain or weight loss. She has no fever or chills. Eyes: No blurred vision Ears, Nose, Mouth, Throat: No dizziness, headaches or changes in hearing. No mouth sores. Cardiovascular: No chest pain, palpitations or edema. Respiratory:  No shortness of breath, wheezing or cough Gastrointestinal: She has normal bowel movements without diarrhea or constipation. She denies any nausea or vomiting. She denies blood in her stool or heart burn. Genitourinary:  She denies pelvic pain, pelvic pressure or changes in her urinary function. She has no hematuria, dysuria, or incontinence. She has no irregular vaginal bleeding or vaginal discharge Musculoskeletal: Denies muscle weakness or joint pains.  Skin:  She has no skin changes, rashes or itching Neurological:  Denies dizziness or headaches. No neuropathy, no numbness or  tingling. Psychiatric:  She denies depression or anxiety. Hematologic/Lymphatic:   No easy bruising or bleeding   Physical Exam: Blood pressure (!) (P) 134/97, pulse (P) 81, temperature (P) 98.1 F (36.7 C), temperature source (P) Oral, resp. rate (P) 18, height (P) 5\' 3"  (1.6 m), weight (P) 120 lb 1.6 oz (54.5 kg), SpO2 (P) 99 %. General: Well dressed, well nourished in no apparent distress.   HEENT:  Normocephalic and atraumatic, no lesions.  Extraocular muscles intact. Sclerae anicteric. Pupils equal, round, reactive. No mouth sores or ulcers. Thyroid is normal size, not nodular, midline. Skin:  No lesions or rashes. Breasts:  deferred Lungs:  deferred Cardiovascular:  deferred Abdomen:  deferred Genitourinary:Pagetoid lesion on length of left labia majora  Incision healing well with small amount of purulent drainage with expression but no cellulitis. Extremities: No cyanosis, clubbing or edema.  No calf tenderness or erythema. No palpable cords. Psychiatric: Mood and affect are appropriate. Neurological: Awake, alert and oriented x 3. Sensation is intact, no neuropathy.  Musculoskeletal: No pain, normal strength and range of motion.   [name], MD

## 2016-01-22 NOTE — Discharge Instructions (Signed)
Vulvectomy, Care After °The vulva is the external female genitalia, outside and around the vagina and pubic bone. It consists of: °· The skin on, and in front of, the pubic bone. °· The clitoris. °· The labia majora (large lips) on the outside of the vagina. °· The labia minora (small lips) around the opening of the vagina. °· The opening and the skin in and around the vagina. °A vulvectomy is the removal of the tissue of the vulva, which sometimes includes removal of the lymph nodes and tissue in the groin areas. °These discharge instructions provide you with general information on caring for yourself after you leave the hospital. It is also important that you know the warning signs of complications, so that you can seek treatment. Please read the instructions outlined below and refer to this sheet in the next few weeks. Your caregiver may also give you specific information and medicines. If you have any questions or complications after discharge, please call your caregiver. °ACTIVITY °· Rest as much as possible the first two weeks after discharge. °· Arrange to have help from family or others with your daily activities when you go home. °· Avoid heavy lifting (more than 5 pounds), pushing, or pulling. °· If you feel tired, balance your activity with rest periods. °· Follow your caregiver's instruction about climbing stairs and driving a car. °· Increase activity gradually. °· Do not exercise until you have permission from your caregiver. °LEG AND FOOT CARE °If your doctor has removed lymph nodes from your groin area, there may be an increase in swelling of your legs and feet. You can help prevent swelling by doing the following: °· Elevate your legs while sitting or lying down. °· If your caregiver has ordered special stockings, wear them according to instructions. °· Avoid standing in one place for long periods of time. °· Call the physical therapy department if you have any questions about swelling or treatment  for swelling. °· Avoid salt in your diet. It can cause fluid retention and swelling. °· Do not cross your legs, especially when sitting. °NUTRITION °· You may resume your normal diet. °· Drink 6 to 8 glasses of fluids a day. °· Eat a healthy, balanced diet including portions of food from the meat (protein), milk, fruit, vegetable, and bread groups. °· Your caregiver may recommend you take a multivitamin with iron. °ELIMINATION °· You may notice that your stream of urine is at a different angle, and may tend to spray. Using a plastic funnel may help to decrease urine spray. °· If constipation occurs, drink more liquids, and add more fruits, vegetables, and bran to your diet. You may take a mild laxative, such as Milk of Magnesia, Metamucil, or a stool softener such as Colace, with permission from your caregiver. °HYGIENE °· You may shower and wash your hair. °· Check with your caregiver about tub baths. °· Do not add any bath oils or chemicals to your bath water, after you have permission to take baths. °· While passing urine, pour water from a bottle or spray over your vulva to dilute the urine as it passes the incision (this will decrease burning and discomfort). °· Clean yourself well after moving your bowels. °· After urinating, do not wipe. Dap or pat dry with toilet paper or a dry cleath soft cloth. °· A sitz bath will help keep your perineal area clean, reduce swelling, and provide comfort. °· Avoid wearing underpants for the first 2 weeks and wear loose skirts to   allow circulation of air around the incision °· You do not need to apply dressings, salves or lotions to the wound. °· The stitches are self-dissolving and will absorb and disappear over a couple of months (it is normal to notice the knot from the stitches on toilet paper after voiding). °HOME CARE INSTRUCTIONS  °· Apply a soft ice pack (or frozen bag of peas) to your perineum (vulva) every hour in the first 48 hours after surgery. This will reduce  swelling. °· Avoid activities that involve a lot of friction between your legs. °· Avoid wearing pants or underpants in the 1st 2 weeks (skirts are preferable). °· Take your temperature twice a day and record it, especially if you feel feverish or have chills. °· Follow your caregiver's instructions about medicines, activity, and follow-up appointments after surgery. °· Do not drink alcohol while taking pain medicine. °· Change your dressing as advised by your caregiver. °· You may take over-the-counter medicine for pain, recommended by your caregiver. °· If your pain is not relieved with medicine, call your caregiver. °· Do not take aspirin because it can cause bleeding. °· Do not douche or use tampons (use a nonperfumed sanitary pad). °· Do not have sexual intercourse until your caregiver gives you permission (typically 6 weeks postoperatively). Hugging, kissing, and playful sexual activity is fine with your caregiver's permission. °· Warm sitz baths, with your caregiver's permission, are helpful to control swelling and discomfort. °· Take showers instead of baths, until your caregiver gives you permission to take baths. °· You may take a mild medicine for constipation, recommended by your caregiver. Bran foods and drinking a lot of fluids will help with constipation. °· Make sure your family understands everything about your operation and recovery. °SEEK MEDICAL CARE IF:  °· You notice swelling and redness around the wound area. °· You notice a foul smell coming from the wound or on the surgical dressing. °· You notice the wound is separating. °· You have painful or bloody urination. °· You develop nausea and vomiting. °· You develop diarrhea. °· You develop a rash. °· You have a reaction or allergy from the medicine. °· You feel dizzy or light-headed. °· You need stronger pain medicine. °SEEK IMMEDIATE MEDICAL CARE IF:  °· You develop a temperature of 102° F (38.9° C) or higher. °· You pass out. °· You develop  leg or chest pain. °· You develop abdominal pain. °· You develop shortness of breath. °· You develop bleeding from the wound area. °· You see pus in the wound area. °MAKE SURE YOU:  °· Understand these instructions. °· Will watch your condition. °· Will get help right away if you are not doing well or get worse. °Document Released: 12/04/2003 Document Revised: 09/05/2013 Document Reviewed: 03/23/2009 °[employer]® Patient Information ©2015 [employer]. This information is not intended to replace advice given to you by your health care provider. Make sure you discuss any questions you have with your health care provider. °Post Anesthesia Home Care Instructions ° °Activity: °Get plenty of rest for the remainder of the day. A responsible adult should stay with you for 24 hours following the procedure.  °For the next 24 hours, DO NOT: °-Drive a car °-Operate machinery °-Drink alcoholic beverages °-Take any medication unless instructed by your physician °-Make any legal decisions or sign important papers. ° °Meals: °Start with liquid foods such as gelatin or soup. Progress to regular foods as tolerated. Avoid greasy, spicy, heavy foods. If nausea and/or vomiting occur, drink   only clear liquids until the nausea and/or vomiting subsides. Call your physician if vomiting continues. ° °Special Instructions/Symptoms: °Your throat may feel dry or sore from the anesthesia or the breathing tube placed in your throat during surgery. If this causes discomfort, gargle with warm salt water. The discomfort should disappear within 24 hours. ° °If you had a scopolamine patch placed behind your ear for the management of post- operative nausea and/or vomiting: ° °1. The medication in the patch is effective for 72 hours, after which it should be removed.  Wrap patch in a tissue and discard in the trash. Wash hands thoroughly with soap and water. °2. You may remove the patch earlier than 72 hours if you experience unpleasant side effects  which may include dry mouth, dizziness or visual disturbances. °3. Avoid touching the patch. Wash your hands with soap and water after contact with the patch. °  ° °

## 2016-01-22 NOTE — Transfer of Care (Signed)
  Last Vitals:  Vitals:   01/22/16 0841  BP: (!) 149/92  Pulse: 68  Resp: 14  Temp: 36.6 C    Last Pain:  Vitals:   01/22/16 0841  TempSrc: Oral      Patients Stated Pain Goal: 5 (01/22/16 0914)  Immediate Anesthesia Transfer of Care Note  Patient: Stephanie Sanchez  Procedure(s) Performed: Procedure(s) (LRB): PARTIAL SIMPLE VULVECTOMY (Left)  Patient Location: PACU  Anesthesia Type: General  Level of Consciousness: awake, alert  and oriented  Airway & Oxygen Therapy: Patient Spontanous Breathing and Patient connected to face mask oxygen  Post-op Assessment: Report given to PACU RN and Post -op Vital signs reviewed and stable  Post vital signs: Reviewed and stable  Complications: No apparent anesthesia complications

## 2016-01-22 NOTE — Anesthesia Procedure Notes (Signed)
Procedure Name: MAC Date/Time: 01/22/2016 10:46 AM Performed by: Oleta Mouse Pre-anesthesia Checklist: Patient identified, Emergency Drugs available, Suction available, Patient being monitored and Timeout performed Patient Re-evaluated:Patient Re-evaluated prior to inductionOxygen Delivery Method: Simple face mask Placement Confirmation: positive ETCO2,  CO2 detector and breath sounds checked- equal and bilateral

## 2016-01-22 NOTE — Interval H&P Note (Signed)
History and Physical Interval Note:  01/22/2016 10:41 AM  Stephanie Sanchez  has presented today for surgery, with the diagnosis of Pagets of the left vulvar  The various methods of treatment have been discussed with the patient and family. After consideration of risks, benefits and other options for treatment, the patient has consented to  Procedure(s): WIDE LOCAL EXCISION OF THE VULVAR (N/A) as a surgical intervention .  The patient's history has been reviewed, patient examined, no change in status, stable for surgery.  I have reviewed the patient's chart and labs.  Questions were answered to the patient's satisfaction.     [name]

## 2016-01-22 NOTE — Anesthesia Preprocedure Evaluation (Signed)
Anesthesia Evaluation  Patient identified by MRN, date of birth, ID band Patient awake    Reviewed: Allergy & Precautions, NPO status , Patient's Chart, lab work & pertinent test results  History of Anesthesia Complications (+) PONV and history of anesthetic complications  Airway Mallampati: II  TM Distance: >3 FB Neck ROM: Full    Dental  (+) Teeth Intact   Pulmonary neg shortness of breath, neg COPD, neg recent URI, former smoker,    breath sounds clear to auscultation       Cardiovascular negative cardio ROS   Rhythm:Regular     Neuro/Psych negative neurological ROS  negative psych ROS   GI/Hepatic Neg liver ROS, GERD  ,  Endo/Other  negative endocrine ROS  Renal/GU negative Renal ROS     Musculoskeletal  (+) Arthritis ,   Abdominal   Peds  Hematology negative hematology ROS (+)   Anesthesia Other Findings   Reproductive/Obstetrics                             Anesthesia Physical Anesthesia Plan  ASA: II  Anesthesia Plan: MAC   Post-op Pain Management:    Induction: Intravenous  Airway Management Planned: Nasal Cannula, Natural Airway and Simple Face Mask  Additional Equipment: None  Intra-op Plan:   Post-operative Plan:   Informed Consent: I have reviewed the patients History and Physical, chart, labs and discussed the procedure including the risks, benefits and alternatives for the proposed anesthesia with the patient or authorized representative who has indicated his/her understanding and acceptance.   Dental advisory given  Plan Discussed with: CRNA and Surgeon  Anesthesia Plan Comments:         Anesthesia Quick Evaluation

## 2016-01-22 NOTE — Op Note (Signed)
PATIENT: Stephanie Sanchez DATE: 01/22/16   Preop Diagnosis: extramammary pagets of the vulva  Postoperative Diagnosis: same  Surgery: Partial simple left vulvectomy  Surgeons:  [name], MD Assistant: none  Anesthesia: General   Estimated blood loss: 20 ml  IVF:  212ml   Urine output: 20 ml   Complications: None   Pathology: left labia majora with marking stitch at 12 o'clock  Operative findings: erythematous epithelium overlying labia majora surrounding prior incision  Procedure: The patient was identified in the preoperative holding area. Informed consent was signed on the chart. Patient was seen history was reviewed and exam was performed.   The patient was then taken to the operating room and placed in the supine position with SCD hose on. General anesthesia was then induced without difficulty. She was then placed in the dorsolithotomy position. The perineum was prepped with Betadine. The vagina was prepped with Betadine. The patient was then draped after the prep was dried. A Foley catheter was inserted into the bladder under sterile conditions.  Timeout was performed the patient, procedure, antibiotic, allergy, and length of procedure. 5% acetic acid solution was applied to the perineum. The vulvar tissues were inspected for areas of acetowhite changes or leukoplakia. The lesion was identified and the marking pen was used to circumscribe the area with appropriate surgical margins. The subcuticular tissues were infiltrated with 0.5% marcaine. The 15 blade scalpel was used to make an incision through the skin circumferentially as marked. The skin elipse was grasped and was separated from the underlying deep dermal tissues with the bovie device. After the specimen had been completely resected, it was oriented and marked at 12 o'clock with a 0-vicryl suture. The bovie was used to obtain hemostasis at the surgical bed. The subcutaneous tissues were irrigated and  made hemostatic.   The deep dermal layer was approximated with 3-0vicryl mattress sutures to bring the skin edges into approximation and off tension. The wound was closed following langher's lines. The cutaneous layer was closed with interrupted 4-0 vicryl stitches and mattress sutures to ensure a tension free and hemostatic closure. The perineum was again irrigated. The foley was removed.  All instrument, suture, laparotomy, Ray-Tec, and needle counts were correct x2. The patient tolerated the procedure well and was taken recovery room in stable condition. This is Everitt Amber dictating an operative note on Maryclare Schwartzman.  [name], MD

## 2016-01-22 NOTE — Anesthesia Postprocedure Evaluation (Signed)
Anesthesia Post Note  Patient: Stephanie Sanchez  Procedure(s) Performed: Procedure(s) (LRB): PARTIAL SIMPLE VULVECTOMY (Left)  Patient location during evaluation: PACU Anesthesia Type: MAC Level of consciousness: awake Pain management: pain level controlled Vital Signs Assessment: post-procedure vital signs reviewed and stable Respiratory status: spontaneous breathing Cardiovascular status: stable Postop Assessment: no signs of nausea or vomiting Anesthetic complications: no    Last Vitals:  Vitals:   01/22/16 1215 01/22/16 1220  BP: 135/78   Pulse: 84 66  Resp:  14  Temp: (!) 36 C     Last Pain:  Vitals:   01/22/16 1145  TempSrc:   PainSc: 0-No pain                 Campbell Kray

## 2016-01-23 ENCOUNTER — Encounter (HOSPITAL_BASED_OUTPATIENT_CLINIC_OR_DEPARTMENT_OTHER): Payer: Self-pay | Admitting: Gynecologic Oncology

## 2016-01-24 ENCOUNTER — Telehealth: Payer: Self-pay | Admitting: Gynecologic Oncology

## 2016-01-24 NOTE — Telephone Encounter (Signed)
Patient contacted and informed of final path.  Stating she is doing well and reports soreness.  Asking if re-excision is necessary with the 9 o clock margin being positive for Pagets.  Advised patient that she would be contact with Dr. Serita Grit recommendations.  No concerns voiced.

## 2016-01-25 ENCOUNTER — Telehealth: Payer: Self-pay | Admitting: Gynecologic Oncology

## 2016-01-25 NOTE — Telephone Encounter (Signed)
Spoke with the patient.  After discussing path yesterday, pt wanted to proceed with another excision for pagets.  Discussed with Dr. Denman George who is agreeable with re-excision the week of Oct 10.  Patient will be scheduled for October 10 around 8:30-8:45am.  She has a follow up with Dr. Denman George before that time.  Advised to call for any needs or concerns.

## 2016-01-28 ENCOUNTER — Telehealth: Payer: Self-pay

## 2016-01-28 ENCOUNTER — Other Ambulatory Visit: Payer: Self-pay | Admitting: Gynecologic Oncology

## 2016-01-28 DIAGNOSIS — Z9889 Other specified postprocedural states: Principal | ICD-10-CM

## 2016-01-28 DIAGNOSIS — R112 Nausea with vomiting, unspecified: Secondary | ICD-10-CM

## 2016-01-28 MED ORDER — SCOPOLAMINE 1 MG/3DAYS TD PT72: 1.0000 | MEDICATED_PATCH | TRANSDERMAL | 1 refills | Status: DC

## 2016-01-28 NOTE — Telephone Encounter (Signed)
Orders received to contact the patient to update with surgery date being posted for October 12 , 2017 with Dr Everitt Amber at our [facility] at 7:30 per [facility] [place]. Patient was contacted and updated , patient states understanding and agrees to date and time.

## 2016-02-01 ENCOUNTER — Ambulatory Visit: Payer: Self-pay | Admitting: Gynecology

## 2016-02-04 ENCOUNTER — Ambulatory Visit: Payer: BC Managed Care – PPO | Attending: Gynecologic Oncology | Admitting: Gynecologic Oncology

## 2016-02-04 ENCOUNTER — Encounter: Payer: Self-pay | Admitting: Gynecologic Oncology

## 2016-02-04 DIAGNOSIS — L280 Lichen simplex chronicus: Secondary | ICD-10-CM | POA: Insufficient documentation

## 2016-02-04 DIAGNOSIS — Z86010 Personal history of colonic polyps: Secondary | ICD-10-CM | POA: Diagnosis not present

## 2016-02-04 DIAGNOSIS — M1990 Unspecified osteoarthritis, unspecified site: Secondary | ICD-10-CM | POA: Insufficient documentation

## 2016-02-04 DIAGNOSIS — L409 Psoriasis, unspecified: Secondary | ICD-10-CM | POA: Diagnosis not present

## 2016-02-04 DIAGNOSIS — Z888 Allergy status to other drugs, medicaments and biological substances status: Secondary | ICD-10-CM | POA: Diagnosis not present

## 2016-02-04 DIAGNOSIS — Z79899 Other long term (current) drug therapy: Secondary | ICD-10-CM | POA: Diagnosis not present

## 2016-02-04 DIAGNOSIS — L720 Epidermal cyst: Secondary | ICD-10-CM | POA: Insufficient documentation

## 2016-02-04 DIAGNOSIS — Z87891 Personal history of nicotine dependence: Secondary | ICD-10-CM | POA: Diagnosis not present

## 2016-02-04 DIAGNOSIS — Z808 Family history of malignant neoplasm of other organs or systems: Secondary | ICD-10-CM | POA: Diagnosis not present

## 2016-02-04 DIAGNOSIS — K219 Gastro-esophageal reflux disease without esophagitis: Secondary | ICD-10-CM | POA: Diagnosis not present

## 2016-02-04 DIAGNOSIS — Z9889 Other specified postprocedural states: Secondary | ICD-10-CM | POA: Diagnosis not present

## 2016-02-04 DIAGNOSIS — C519 Malignant neoplasm of vulva, unspecified: Secondary | ICD-10-CM

## 2016-02-04 DIAGNOSIS — C4499 Other specified malignant neoplasm of skin, unspecified: Secondary | ICD-10-CM

## 2016-02-04 DIAGNOSIS — M419 Scoliosis, unspecified: Secondary | ICD-10-CM | POA: Diagnosis not present

## 2016-02-04 DIAGNOSIS — Z8719 Personal history of other diseases of the digestive system: Secondary | ICD-10-CM | POA: Diagnosis not present

## 2016-02-04 NOTE — Patient Instructions (Signed)
Your surgery is scheduled on October 12 at 8 am at the [facility].  You will receive a phone call from the pre-surgical RN several days before your procedure to discuss instructions.  Please call for any questions or concerns.

## 2016-02-04 NOTE — Progress Notes (Signed)
GYN ONC FOLLOW UP  Assessment:    60 y.o. 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   S/p simple partial left vulvectomy on 08/29/14.  S/p wide local excision of left labia on 01/22/16 for pagets with positive margins  Plan: Will return to OR for additional resection of left labia minora (medial margin). Discussed risks of procedure including infection, wound separation, pain. Discussed high rate of recurrence with pagets.    HPI:  Stephanie Sanchez is a 27 y.o. year old initially seen in consultation on 07/02/12 for extramammary pagets.  She then underwent a simple partial left vulvectomy on [id number] without complications.  Her postoperative course was uncomplicated.  Her final pathology revealed extramammary pagets of the left lateral labia majora with inked bilateral margins and 12 o'clock margin. The biopsies from the pruritic vulva on the posterior vagina and perinal regions revealed benign hyperplasia consistent with lichen simplex chronicus.  When I saw the patient in December, 2016 she had noticed the development of a "cyst" in the left posterior labia majora in the past month. It has decreased in size from a "kidney bean" size to now a "pea size". Tender. Increases in size when she wears pants.   On 05/04/15 she went to the OR for an excision of the posterior left labia majora which revealed a benign epidermal inclusion cyst. Random biopsies from the left and right mid labia minora and perineum were negative for LS and pagets.  Interval Hx: Biopsy performed on 8/24/17by Dr Helane Rima  of left labia majora showed extramammary pagets.  Last colonoscopy 11/16  Surgery was performed on 01/22/16 (left vulvectomy) and pagets was positive on the 9 o'clock margin.  Current Outpatient Prescriptions on File Prior to Visit  Medication Sig Dispense Refill  . Ascorbic Acid (VITAMIN C) 1000 MG tablet Take 1,000 mg by mouth daily.     .  Cholecalciferol (VITAMIN D) 2000 UNITS tablet Take 2,000 Units by mouth daily.    Marland Kitchen estradiol (MINIVELLE) 0.05 MG/24HR patch twice a week. patch    . magnesium 30 MG tablet Take 30 mg by mouth daily.     Marland Kitchen senna (SENOKOT) 8.6 MG TABS tablet Take 1 tablet (8.6 mg total) by mouth daily. 120 each 0  . Aspirin-Acetaminophen-Caffeine (EXCEDRIN PO) Take by mouth as needed. Reported on 05/28/2015    . HYDROmorphone (DILAUDID) 2 MG tablet Take 1 tablet (2 mg total) by mouth every 4 (four) hours as needed for severe pain. (Patient not taking: Reported on 02/04/2016) 30 tablet 0  . lidocaine (LIDODERM) 5 % Reported on 05/28/2015    . scopolamine (TRANSDERM-SCOP) 1 MG/3DAYS Place 1 patch (1.5 mg total) onto the skin every 3 (three) days. (Patient not taking: Reported on 02/04/2016) 10 patch 1  . TURMERIC PO Take 1 capsule by mouth daily.      No current facility-administered medications on file prior to visit.    Allergies  Allergen Reactions  . Codeine Itching, Nausea And Vomiting and Rash    "comes out like a burn/rash"   Past Medical History:  Diagnosis Date  . Arthritis   . GERD (gastroesophageal reflux disease)   . History of cervical dysplasia   . History of colon polyps   . History of gastric ulcer    2013  . History of squamous cell carcinoma excision    left elbow  . Lichen simplex chronicus   . Paget's disease of vulva  first dx 2013  . PONV (postoperative nausea and vomiting)   . Psoriasis   . Scoliosis    Past Surgical History:  Procedure Laterality Date  . BLADDER SUSPENSION  2008   sling  . BUNIONECTOMY  2008  . CARDIOVASCULAR STRESS TEST  09-30-2013   dr Marlou Porch   normal nuclear study/  no ischemia/  normal LV function and wall motion,  ef 61%  . CATARACT EXTRACTION W/ INTRAOCULAR LENS  IMPLANT, BILATERAL    . HAND SURGERY Right 11/02/12   hand reconstruction at [facility]  . TONSILLECTOMY  20  age 11  . VAGINAL HYSTERECTOMY  87  age 86  . VULVA Milagros Loll BIOPSY N/A  08/29/2014   Procedure: Georgena Spurling;  Surgeon: Everitt Amber, MD;  Location: [facility];  Service: Gynecology;  Laterality: N/A;  . VULVECTOMY N/A 08/29/2014   Procedure: WIDE LOCAL EXCISION OF VULVA;  Surgeon: Everitt Amber, MD;  Location: [place];  Service: Gynecology;  Laterality: N/A;  . VULVECTOMY  2013  . VULVECTOMY N/A 05/04/2015   Procedure: WIDE LOCAL EXCISION LEFT  VULVAR WITH BIOPSY OF RIGHT VULVA;  Surgeon: Everitt Amber, MD;  Location: [place];  Service: Gynecology;  Laterality: N/A;  . VULVECTOMY Left 01/22/2016   Procedure: PARTIAL SIMPLE VULVECTOMY;  Surgeon: Everitt Amber, MD;  [facility];  Service: Gynecology;  Laterality: Left;   Family History  Problem Relation Age of Onset  . Aortic aneurysm Mother   . AAA (abdominal aortic aneurysm) Mother   . Diabetes Father   . Other Father     enlarged heart  . Arrhythmia Brother   . Heart attack Maternal Grandfather   . Arrhythmia Brother   . Thyroid cancer Daughter 27   Social History   Social History  . Marital status: Divorced    Spouse name: N/A  . Number of children: N/A  . Years of education: N/A   Occupational History  . Not on file.   Social History Main Topics  . Smoking status: Former Smoker    Packs/day: 1.00    Years: 5.00    Types: Cigarettes    Quit date: 10/05/1978  . Smokeless tobacco: Never Used  . Alcohol use 0.6 - 1.2 oz/week    1 - 2 Glasses of wine per week     Comment: occas  . Drug use: No  . Sexual activity: Not on file   Other Topics Concern  . Not on file   Social History Narrative   ** Merged History Encounter **        Review of systems: Constitutional:  She has no weight gain or weight loss. She has no fever or chills. Eyes: No blurred vision Ears, Nose, Mouth, Throat: No dizziness, headaches or changes in hearing. No mouth sores. Cardiovascular: No chest pain, palpitations or edema. Respiratory:  No  shortness of breath, wheezing or cough Gastrointestinal: She has normal bowel movements without diarrhea or constipation. She denies any nausea or vomiting. She denies blood in her stool or heart burn. Genitourinary:  She denies pelvic pain, pelvic pressure or changes in her urinary function. She has no hematuria, dysuria, or incontinence. She has no irregular vaginal bleeding or vaginal discharge Musculoskeletal: Denies muscle weakness or joint pains.  Skin:  She has no skin changes, rashes or itching Neurological:  Denies dizziness or headaches. No neuropathy, no numbness or tingling. Psychiatric:  She denies depression or anxiety. Hematologic/Lymphatic:   No easy bruising  or bleeding   Physical Exam: There were no vitals taken for this visit. General: Well dressed, well nourished in no apparent distress.   HEENT:  Normocephalic and atraumatic, no lesions.  Extraocular muscles intact. Sclerae anicteric. Pupils equal, round, reactive. No mouth sores or ulcers. Thyroid is normal size, not nodular, midline. Skin:  No lesions or rashes. Breasts:  deferred Lungs:  deferred Cardiovascular:  deferred Abdomen:  deferred Genitourinary: Incision healing well with no drainage. No visible pagets lesions Extremities: No cyanosis, clubbing or edema.  No calf tenderness or erythema. No palpable cords. Psychiatric: Mood and affect are appropriate. Neurological: Awake, alert and oriented x 3. Sensation is intact, no neuropathy.  Musculoskeletal: No pain, normal strength and range of motion.   [name], MD

## 2016-02-11 ENCOUNTER — Encounter (HOSPITAL_BASED_OUTPATIENT_CLINIC_OR_DEPARTMENT_OTHER): Payer: Self-pay | Admitting: *Deleted

## 2016-02-11 NOTE — Progress Notes (Signed)
NPO AFTER MN.  ARRIVE AT 0700.  CURRENTLY HG IN CHART AND EPIC.

## 2016-02-14 ENCOUNTER — Ambulatory Visit (HOSPITAL_BASED_OUTPATIENT_CLINIC_OR_DEPARTMENT_OTHER)
Admission: RE | Admit: 2016-02-14 | Discharge: 2016-02-14 | Disposition: A | Discharge status: Home / Self Care | Payer: BC Managed Care – PPO | Source: Ambulatory Visit | Attending: Gynecologic Oncology | Admitting: Gynecologic Oncology

## 2016-02-14 ENCOUNTER — Encounter: Payer: Self-pay | Admitting: Gynecologic Oncology

## 2016-02-14 ENCOUNTER — Telehealth: Payer: Self-pay | Admitting: Gynecologic Oncology

## 2016-02-14 ENCOUNTER — Encounter (HOSPITAL_BASED_OUTPATIENT_CLINIC_OR_DEPARTMENT_OTHER)
Admission: RE | Disposition: A | Discharge status: Home / Self Care | Payer: Self-pay | Source: Ambulatory Visit | Attending: Gynecologic Oncology

## 2016-02-14 ENCOUNTER — Ambulatory Visit (HOSPITAL_BASED_OUTPATIENT_CLINIC_OR_DEPARTMENT_OTHER): Payer: BC Managed Care – PPO | Admitting: Anesthesiology

## 2016-02-14 ENCOUNTER — Encounter (HOSPITAL_BASED_OUTPATIENT_CLINIC_OR_DEPARTMENT_OTHER): Payer: Self-pay | Admitting: Gynecologic Oncology

## 2016-02-14 DIAGNOSIS — Z7982 Long term (current) use of aspirin: Secondary | ICD-10-CM | POA: Diagnosis not present

## 2016-02-14 DIAGNOSIS — Z90710 Acquired absence of both cervix and uterus: Secondary | ICD-10-CM | POA: Insufficient documentation

## 2016-02-14 DIAGNOSIS — M419 Scoliosis, unspecified: Secondary | ICD-10-CM | POA: Insufficient documentation

## 2016-02-14 DIAGNOSIS — G8918 Other acute postprocedural pain: Secondary | ICD-10-CM

## 2016-02-14 DIAGNOSIS — K219 Gastro-esophageal reflux disease without esophagitis: Secondary | ICD-10-CM | POA: Diagnosis not present

## 2016-02-14 DIAGNOSIS — C519 Malignant neoplasm of vulva, unspecified: Secondary | ICD-10-CM

## 2016-02-14 DIAGNOSIS — Q5279 Other congenital malformations of vulva: Secondary | ICD-10-CM | POA: Diagnosis not present

## 2016-02-14 DIAGNOSIS — Z87891 Personal history of nicotine dependence: Secondary | ICD-10-CM | POA: Diagnosis not present

## 2016-02-14 DIAGNOSIS — N949 Unspecified condition associated with female genital organs and menstrual cycle: Secondary | ICD-10-CM | POA: Diagnosis present

## 2016-02-14 DIAGNOSIS — C4499 Other specified malignant neoplasm of skin, unspecified: Secondary | ICD-10-CM

## 2016-02-14 HISTORY — PX: VULVECTOMY: SHX1086

## 2016-02-14 SURGERY — WIDE EXCISION VULVECTOMY
Anesthesia: Monitor Anesthesia Care

## 2016-02-14 MED ORDER — ACETAMINOPHEN 10 MG/ML IV SOLN
INTRAVENOUS | Status: AC
  Filled 2016-02-14: qty 100

## 2016-02-14 MED ORDER — LACTATED RINGERS IV SOLN
INTRAVENOUS | Status: DC
  Administered 2016-02-14 (×2): via INTRAVENOUS
  Filled 2016-02-14: qty 1000

## 2016-02-14 MED ORDER — PROPOFOL 500 MG/50ML IV EMUL
INTRAVENOUS | Status: AC
  Filled 2016-02-14: qty 50

## 2016-02-14 MED ORDER — KETAMINE HCL 10 MG/ML IJ SOLN
INTRAMUSCULAR | Status: DC | PRN
  Administered 2016-02-14: 30 mg via INTRAVENOUS

## 2016-02-14 MED ORDER — ACETAMINOPHEN 10 MG/ML IV SOLN
INTRAVENOUS | Status: DC | PRN
  Administered 2016-02-14: 1000 mg via INTRAVENOUS

## 2016-02-14 MED ORDER — BUPIVACAINE HCL (PF) 0.5 % IJ SOLN
INTRAMUSCULAR | Status: DC | PRN
  Administered 2016-02-14: 30 mL

## 2016-02-14 MED ORDER — ONDANSETRON HCL 4 MG/2ML IJ SOLN
INTRAMUSCULAR | Status: DC | PRN
  Administered 2016-02-14: 4 mg via INTRAVENOUS

## 2016-02-14 MED ORDER — PROPOFOL 500 MG/50ML IV EMUL
INTRAVENOUS | Status: DC | PRN
  Administered 2016-02-14: 120 ug/kg/min via INTRAVENOUS

## 2016-02-14 MED ORDER — MIDAZOLAM HCL 5 MG/5ML IJ SOLN
INTRAMUSCULAR | Status: DC | PRN
  Administered 2016-02-14: 2 mg via INTRAVENOUS

## 2016-02-14 MED ORDER — LIDOCAINE 2% (20 MG/ML) 5 ML SYRINGE
INTRAMUSCULAR | Status: AC
  Filled 2016-02-14: qty 5

## 2016-02-14 MED ORDER — SCOPOLAMINE 1 MG/3DAYS TD PT72
MEDICATED_PATCH | TRANSDERMAL | Status: AC
  Filled 2016-02-14: qty 1

## 2016-02-14 MED ORDER — HYDROMORPHONE HCL 4 MG PO TABS: 4.0000 mg | ORAL_TABLET | Freq: Four times a day (QID) | ORAL | 0 refills | Status: DC | PRN

## 2016-02-14 MED ORDER — KETAMINE HCL 10 MG/ML IJ SOLN
INTRAMUSCULAR | Status: AC
  Filled 2016-02-14: qty 1

## 2016-02-14 MED ORDER — EPHEDRINE 5 MG/ML INJ
INTRAVENOUS | Status: AC
  Filled 2016-02-14: qty 10

## 2016-02-14 MED ORDER — ONDANSETRON HCL 4 MG/2ML IJ SOLN
INTRAMUSCULAR | Status: AC
  Filled 2016-02-14: qty 2

## 2016-02-14 MED ORDER — DEXAMETHASONE SODIUM PHOSPHATE 4 MG/ML IJ SOLN
INTRAMUSCULAR | Status: DC | PRN
  Administered 2016-02-14: 10 mg via INTRAVENOUS

## 2016-02-14 MED ORDER — ACETIC ACID 5 % SOLN
Status: DC | PRN
  Administered 2016-02-14: 1 via TOPICAL

## 2016-02-14 MED ORDER — FENTANYL CITRATE (PF) 100 MCG/2ML IJ SOLN
INTRAMUSCULAR | Status: DC | PRN
  Administered 2016-02-14: 50 ug via INTRAVENOUS

## 2016-02-14 MED ORDER — FENTANYL CITRATE (PF) 100 MCG/2ML IJ SOLN
INTRAMUSCULAR | Status: AC
  Filled 2016-02-14: qty 2

## 2016-02-14 MED ORDER — LIDOCAINE 2% (20 MG/ML) 5 ML SYRINGE
INTRAMUSCULAR | Status: DC | PRN
  Administered 2016-02-14: 60 mg via INTRAVENOUS

## 2016-02-14 MED ORDER — KETOROLAC TROMETHAMINE 30 MG/ML IJ SOLN
INTRAMUSCULAR | Status: DC | PRN
  Administered 2016-02-14: 30 mg via INTRAVENOUS

## 2016-02-14 MED ORDER — SCOPOLAMINE 1 MG/3DAYS TD PT72
MEDICATED_PATCH | TRANSDERMAL | Status: DC | PRN
  Administered 2016-02-14: 1 via TRANSDERMAL

## 2016-02-14 MED ORDER — KETOROLAC TROMETHAMINE 30 MG/ML IJ SOLN
INTRAMUSCULAR | Status: AC
  Filled 2016-02-14: qty 1

## 2016-02-14 MED ORDER — DEXAMETHASONE SODIUM PHOSPHATE 10 MG/ML IJ SOLN
INTRAMUSCULAR | Status: AC
  Filled 2016-02-14: qty 1

## 2016-02-14 MED ORDER — EPHEDRINE SULFATE-NACL 50-0.9 MG/10ML-% IV SOSY
PREFILLED_SYRINGE | INTRAVENOUS | Status: DC | PRN
  Administered 2016-02-14: 10 mg via INTRAVENOUS

## 2016-02-14 MED ORDER — MIDAZOLAM HCL 2 MG/2ML IJ SOLN
INTRAMUSCULAR | Status: AC
  Filled 2016-02-14: qty 2

## 2016-02-14 MED ORDER — MORPHINE SULFATE 15 MG PO TABS: 15.0000 mg | ORAL_TABLET | Freq: Four times a day (QID) | ORAL | 0 refills | Status: DC | PRN

## 2016-02-14 SURGICAL SUPPLY — 45 items
APPLICATOR COTTON TIP 6IN STRL (MISCELLANEOUS) IMPLANT
BLADE CLIPPER SURG (BLADE) IMPLANT
BLADE SURG 15 STRL LF DISP TIS (BLADE) ×1 IMPLANT
BLADE SURG 15 STRL SS (BLADE) ×2
BRIEF STRETCH FOR OB PAD LRG (UNDERPADS AND DIAPERS) ×2 IMPLANT
CANISTER SUCTION 2500CC (MISCELLANEOUS) ×2 IMPLANT
CATH FOLEY 2WAY SLVR  5CC 14FR (CATHETERS)
CATH FOLEY 2WAY SLVR 5CC 14FR (CATHETERS) IMPLANT
CATH ROBINSON RED A/P 14FR (CATHETERS) ×2 IMPLANT
COVER BACK TABLE 60X90IN (DRAPES) ×2 IMPLANT
DRAPE LG THREE QUARTER DISP (DRAPES) ×2 IMPLANT
DRAPE UNDERBUTTOCKS STRL (DRAPE) ×2 IMPLANT
ELECT REM PT RETURN 9FT ADLT (ELECTROSURGICAL) ×2
ELECTRODE REM PT RTRN 9FT ADLT (ELECTROSURGICAL) ×1 IMPLANT
GAUZE SPONGE 4X4 16PLY XRAY LF (GAUZE/BANDAGES/DRESSINGS) IMPLANT
GLOVE BIO SURGEON STRL SZ 6 (GLOVE) ×4 IMPLANT
KIT ROOM TURNOVER WOR (KITS) ×2 IMPLANT
LEGGING LITHOTOMY PAIR STRL (DRAPES) ×2 IMPLANT
NEEDLE HYPO 25X1 1.5 SAFETY (NEEDLE) ×2 IMPLANT
NS IRRIG 500ML POUR BTL (IV SOLUTION) ×2 IMPLANT
PACK BASIN DAY SURGERY FS (CUSTOM PROCEDURE TRAY) ×2 IMPLANT
PAD OB MATERNITY 4.3X12.25 (PERSONAL CARE ITEMS) ×2 IMPLANT
PAD PREP 24X48 CUFFED NSTRL (MISCELLANEOUS) ×2 IMPLANT
PENCIL BUTTON HOLSTER BLD 10FT (ELECTRODE) ×2 IMPLANT
SCOPETTES 8  STERILE (MISCELLANEOUS)
SCOPETTES 8 STERILE (MISCELLANEOUS) IMPLANT
SUT VIC AB 0 SH 27 (SUTURE) ×2 IMPLANT
SUT VIC AB 2-0 CT2 27 (SUTURE) IMPLANT
SUT VIC AB 2-0 SH 27 (SUTURE)
SUT VIC AB 2-0 SH 27XBRD (SUTURE) IMPLANT
SUT VIC AB 3-0 PS2 18 (SUTURE)
SUT VIC AB 3-0 PS2 18XBRD (SUTURE) IMPLANT
SUT VIC AB 3-0 SH 27 (SUTURE) ×8
SUT VIC AB 3-0 SH 27X BRD (SUTURE) ×4 IMPLANT
SUT VICRYL 2 0 18  UND BR (SUTURE)
SUT VICRYL 2 0 18 UND BR (SUTURE) IMPLANT
SUT VICRYL 4-0 PS2 18IN ABS (SUTURE) ×12 IMPLANT
SYR BULB IRRIGATION 50ML (SYRINGE) ×2 IMPLANT
SYR CONTROL 10ML LL (SYRINGE) ×2 IMPLANT
TOWEL OR 17X24 6PK STRL BLUE (TOWEL DISPOSABLE) ×2 IMPLANT
TRAY DSU PREP LF (CUSTOM PROCEDURE TRAY) ×2 IMPLANT
TUBE CONNECTING 12X1/4 (SUCTIONS) ×2 IMPLANT
VACUUM HOSE/TUBING 7/8INX6FT (MISCELLANEOUS) IMPLANT
WATER STERILE IRR 500ML POUR (IV SOLUTION) ×2 IMPLANT
YANKAUER SUCT BULB TIP NO VENT (SUCTIONS) ×2 IMPLANT

## 2016-02-14 NOTE — Interval H&P Note (Signed)
History and Physical Interval Note:  02/14/2016 7:07 AM  Stephanie Sanchez  has presented today for surgery, with the diagnosis of PAGETS OD VULVA DISEASE  The various methods of treatment have been discussed with the patient and family. After consideration of risks, benefits and other options for treatment, the patient has consented to  Procedure(s): WIDE EXCISION VULVECTOMY (N/A) as a surgical intervention .  The patient's history has been reviewed, patient examined, no change in status, stable for surgery.  I have reviewed the patient's chart and labs.  Questions were answered to the patient's satisfaction.     [name]

## 2016-02-14 NOTE — Anesthesia Postprocedure Evaluation (Signed)
Anesthesia Post Note  Patient: Stephanie Sanchez  Procedure(s) Performed: Procedure(s) (LRB): WIDE EXCISION VULVECTOMY (N/A)  Patient location during evaluation: PACU Anesthesia Type: MAC Level of consciousness: awake and alert Pain management: pain level controlled Vital Signs Assessment: post-procedure vital signs reviewed and stable Respiratory status: spontaneous breathing, nonlabored ventilation, respiratory function stable and patient connected to nasal cannula oxygen Cardiovascular status: stable and blood pressure returned to baseline Anesthetic complications: no    Last Vitals:  Vitals:   02/14/16 1008 02/14/16 1030  BP: 111/69 117/63  Pulse: 87 79  Resp: 16 14  Temp: 36.4 C     Last Pain:  Vitals:   02/14/16 0708  TempSrc: Oral                 Costantino Kohlbeck J

## 2016-02-14 NOTE — Transfer of Care (Signed)
  Last Vitals:  Vitals:   02/14/16 0708 02/14/16 1008  Pulse: 86 87  Resp: 18 16  Temp: 36.8 C 36.4 C    Last Pain:  Vitals:   02/14/16 0708  TempSrc: Oral      Patients Stated Pain Goal: 5 (02/14/16 0725) Immediate Anesthesia Transfer of Care Note  Patient: Stephanie Sanchez  Procedure(s) Performed: Procedure(s) (LRB): WIDE EXCISION VULVECTOMY (N/A)  Patient Location: PACU  Anesthesia Type: MAC  Level of Consciousness: awake, alert  and oriented  Airway & Oxygen Therapy: Patient Spontanous Breathing and Patient connected to face mask oxygen  Post-op Assessment: Report given to PACU RN and Post -op Vital signs reviewed and stable  Post vital signs: Reviewed and stable  Complications: No apparent anesthesia complications

## 2016-02-14 NOTE — Op Note (Signed)
PATIENT: Stephanie Sanchez DATE: 02/14/16   Preop Diagnosis: extramammary pagets vulva  Postoperative Diagnosis: same  Surgery: Partial simple simple complete left vulvectomy  Surgeons:  [name], MD Assistant: none  Anesthesia: General   Estimated blood loss: <20 ml  IVF:  100 ml   Urine output: [id number] ml   Complications: None   Pathology: left vulva with marking stitch at 12 o'clock anterior  Operative findings: no visible pagets disease on vulva. Well healed prior incision.  Procedure: The patient was identified in the preoperative holding area. Informed consent was signed on the chart. Patient was seen history was reviewed and exam was performed.   The patient was then taken to the operating room and placed in the supine position with SCD hose on. General anesthesia was then induced without difficulty. She was then placed in the dorsolithotomy position. The perineum was prepped with Betadine. The vagina was prepped with Betadine. The patient was then draped after the prep was dried. A Foley catheter was inserted into the bladder under sterile conditions.  Timeout was performed the patient, procedure, antibiotic, allergy, and length of procedure. 5% acetic acid solution was applied to the perineum. No visible lesion was idnetified but the known previously positive medial margin of the prior incision was identified and the marking pen was used to circumscribe the area with appropriate surgical margins. The subcuticular tissues were infiltrated with 0.5% marcaine. The 15 blade scalpel was used to make an incision through the skin circumferentially as marked. The skin elipse was grasped and was separated from the underlying deep dermal tissues with the bovie device. After the specimen had been completely resected, it was oriented and marked at 12 o'clock anterior with a 0-vicryl suture. The bovie was used to obtain hemostasis at the surgical bed. The subcutaneous tissues  were irrigated and made hemostatic.   The deep dermal layer was approximated with 3-0vicryl mattress sutures to bring the skin edges into approximation and off tension. The wound was closed following langher's lines. The cutaneous layer was closed with interrupted 4-0 vicryl stitches and mattress sutures to ensure a tension free and hemostatic closure. The perineum was again irrigated. The foley was removed.  All instrument, suture, laparotomy, Ray-Tec, and needle counts were correct x2. The patient tolerated the procedure well and was taken recovery room in stable condition. This is Everitt Amber dictating an operative note on Josilin Sether.

## 2016-02-14 NOTE — Anesthesia Preprocedure Evaluation (Addendum)
Anesthesia Evaluation  Patient identified by MRN, date of birth, ID band Patient awake    Reviewed: Allergy & Precautions, NPO status , Patient's Chart, lab work & pertinent test results  History of Anesthesia Complications (+) PONV and history of anesthetic complications  Airway Mallampati: II  TM Distance: >3 FB Neck ROM: Full    Dental no notable dental hx.    Pulmonary shortness of breath, former smoker,    Pulmonary exam normal breath sounds clear to auscultation       Cardiovascular negative cardio ROS Normal cardiovascular exam Rhythm:Regular Rate:Normal     Neuro/Psych negative neurological ROS  negative psych ROS   GI/Hepatic Neg liver ROS, GERD  ,  Endo/Other  negative endocrine ROS  Renal/GU negative Renal ROS  negative genitourinary   Musculoskeletal  (+) Arthritis ,   Abdominal   Peds negative pediatric ROS (+)  Hematology negative hematology ROS (+)   Anesthesia Other Findings   Reproductive/Obstetrics negative OB ROS                             Anesthesia Physical Anesthesia Plan  ASA: II  Anesthesia Plan: MAC   Post-op Pain Management:    Induction: Intravenous  Airway Management Planned:   Additional Equipment:   Intra-op Plan:   Post-operative Plan:   Informed Consent: I have reviewed the patients History and Physical, chart, labs and discussed the procedure including the risks, benefits and alternatives for the proposed anesthesia with the patient or authorized representative who has indicated his/her understanding and acceptance.     Plan Discussed with: CRNA  Anesthesia Plan Comments: (General versus MAC. Last two similar procedures were MAC.)       Anesthesia Quick Evaluation

## 2016-02-14 NOTE — Discharge Instructions (Signed)
Vulvectomy, Care After °The vulva is the external female genitalia, outside and around the vagina and pubic bone. It consists of: °· The skin on, and in front of, the pubic bone. °· The clitoris. °· The labia majora (large lips) on the outside of the vagina. °· The labia minora (small lips) around the opening of the vagina. °· The opening and the skin in and around the vagina. °A vulvectomy is the removal of the tissue of the vulva, which sometimes includes removal of the lymph nodes and tissue in the groin areas. °These discharge instructions provide you with general information on caring for yourself after you leave the hospital. It is also important that you know the warning signs of complications, so that you can seek treatment. Please read the instructions outlined below and refer to this sheet in the next few weeks. Your caregiver may also give you specific information and medicines. If you have any questions or complications after discharge, please call your caregiver. °ACTIVITY °· Rest as much as possible the first two weeks after discharge. °· Arrange to have help from family or others with your daily activities when you go home. °· Avoid heavy lifting (more than 5 pounds), pushing, or pulling. °· If you feel tired, balance your activity with rest periods. °· Follow your caregiver's instruction about climbing stairs and driving a car. °· Increase activity gradually. °· Do not exercise until you have permission from your caregiver. °LEG AND FOOT CARE °If your doctor has removed lymph nodes from your groin area, there may be an increase in swelling of your legs and feet. You can help prevent swelling by doing the following: °· Elevate your legs while sitting or lying down. °· If your caregiver has ordered special stockings, wear them according to instructions. °· Avoid standing in one place for long periods of time. °· Call the physical therapy department if you have any questions about swelling or treatment  for swelling. °· Avoid salt in your diet. It can cause fluid retention and swelling. °· Do not cross your legs, especially when sitting. °NUTRITION °· You may resume your normal diet. °· Drink 6 to 8 glasses of fluids a day. °· Eat a healthy, balanced diet including portions of food from the meat (protein), milk, fruit, vegetable, and bread groups. °· Your caregiver may recommend you take a multivitamin with iron. °ELIMINATION °· You may notice that your stream of urine is at a different angle, and may tend to spray. Using a plastic funnel may help to decrease urine spray. °· If constipation occurs, drink more liquids, and add more fruits, vegetables, and bran to your diet. You may take a mild laxative, such as Milk of Magnesia, Metamucil, or a stool softener such as Colace, with permission from your caregiver. °HYGIENE °· You may shower and wash your hair. °· Check with your caregiver about tub baths. °· Do not add any bath oils or chemicals to your bath water, after you have permission to take baths. °· While passing urine, pour water from a bottle or spray over your vulva to dilute the urine as it passes the incision (this will decrease burning and discomfort). °· Clean yourself well after moving your bowels. °· After urinating, do not wipe. Dap or pat dry with toilet paper or a dry cleath soft cloth. °· A sitz bath will help keep your perineal area clean, reduce swelling, and provide comfort. °· Avoid wearing underpants for the first 2 weeks and wear loose skirts to   allow circulation of air around the incision °· You do not need to apply dressings, salves or lotions to the wound. °· The stitches are self-dissolving and will absorb and disappear over a couple of months (it is normal to notice the knot from the stitches on toilet paper after voiding). °HOME CARE INSTRUCTIONS  °· Apply a soft ice pack (or frozen bag of peas) to your perineum (vulva) every hour in the first 48 hours after surgery. This will reduce  swelling. °· Avoid activities that involve a lot of friction between your legs. °· Avoid wearing pants or underpants in the 1st 2 weeks (skirts are preferable). °· Take your temperature twice a day and record it, especially if you feel feverish or have chills. °· Follow your caregiver's instructions about medicines, activity, and follow-up appointments after surgery. °· Do not drink alcohol while taking pain medicine. °· Change your dressing as advised by your caregiver. °· You may take over-the-counter medicine for pain, recommended by your caregiver. °· If your pain is not relieved with medicine, call your caregiver. °· Do not take aspirin because it can cause bleeding. °· Do not douche or use tampons (use a nonperfumed sanitary pad). °· Do not have sexual intercourse until your caregiver gives you permission (typically 6 weeks postoperatively). Hugging, kissing, and playful sexual activity is fine with your caregiver's permission. °· Warm sitz baths, with your caregiver's permission, are helpful to control swelling and discomfort. °· Take showers instead of baths, until your caregiver gives you permission to take baths. °· You may take a mild medicine for constipation, recommended by your caregiver. Bran foods and drinking a lot of fluids will help with constipation. °· Make sure your family understands everything about your operation and recovery. °SEEK MEDICAL CARE IF:  °· You notice swelling and redness around the wound area. °· You notice a foul smell coming from the wound or on the surgical dressing. °· You notice the wound is separating. °· You have painful or bloody urination. °· You develop nausea and vomiting. °· You develop diarrhea. °· You develop a rash. °· You have a reaction or allergy from the medicine. °· You feel dizzy or light-headed. °· You need stronger pain medicine. °SEEK IMMEDIATE MEDICAL CARE IF:  °· You develop a temperature of 102° F (38.9° C) or higher. °· You pass out. °· You develop  leg or chest pain. °· You develop abdominal pain. °· You develop shortness of breath. °· You develop bleeding from the wound area. °· You see pus in the wound area. °MAKE SURE YOU:  °· Understand these instructions. °· Will watch your condition. °· Will get help right away if you are not doing well or get worse. °Document Released: 12/04/2003 Document Revised: 09/05/2013 Document Reviewed: 03/23/2009 °[employer]® Patient Information ©2015 [employer]. This information is not intended to replace advice given to you by your health care provider. Make sure you discuss any questions you have with your health care provider. °Post Anesthesia Home Care Instructions ° °Activity: °Get plenty of rest for the remainder of the day. A responsible adult should stay with you for 24 hours following the procedure.  °For the next 24 hours, DO NOT: °-Drive a car °-Operate machinery °-Drink alcoholic beverages °-Take any medication unless instructed by your physician °-Make any legal decisions or sign important papers. ° °Meals: °Start with liquid foods such as gelatin or soup. Progress to regular foods as tolerated. Avoid greasy, spicy, heavy foods. If nausea and/or vomiting occur, drink   only clear liquids until the nausea and/or vomiting subsides. Call your physician if vomiting continues. ° °Special Instructions/Symptoms: °Your throat may feel dry or sore from the anesthesia or the breathing tube placed in your throat during surgery. If this causes discomfort, gargle with warm salt water. The discomfort should disappear within 24 hours. ° °If you had a scopolamine patch placed behind your ear for the management of post- operative nausea and/or vomiting: ° °1. The medication in the patch is effective for 72 hours, after which it should be removed.  Wrap patch in a tissue and discard in the trash. Wash hands thoroughly with soap and water. °2. You may remove the patch earlier than 72 hours if you experience unpleasant side effects  which may include dry mouth, dizziness or visual disturbances. °3. Avoid touching the patch. Wash your hands with soap and water after contact with the patch. °  ° °

## 2016-02-14 NOTE — Anesthesia Procedure Notes (Signed)
Procedure Name: MAC Date/Time: 02/14/2016 9:05 AM Performed by: Franne Grip Pre-anesthesia Checklist: Patient identified, Emergency Drugs available, Suction available, Patient being monitored and Timeout performed Patient Re-evaluated:Patient Re-evaluated prior to inductionOxygen Delivery Method: Simple face mask Intubation Type: IV induction Placement Confirmation: positive ETCO2 and breath sounds checked- equal and bilateral

## 2016-02-14 NOTE — H&P (View-Only) (Signed)
GYN ONC FOLLOW UP  Assessment:    60 y.o. 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   S/p simple partial left vulvectomy on 08/29/14.  S/p wide local excision of left labia on 01/22/16 for pagets with positive margins  Plan: Will return to OR for additional resection of left labia minora (medial margin). Discussed risks of procedure including infection, wound separation, pain. Discussed high rate of recurrence with pagets.    HPI:  Stephanie Sanchez is a 89 y.o. year old initially seen in consultation on 07/02/12 for extramammary pagets.  She then underwent a simple partial left vulvectomy on 99991111 without complications.  Her postoperative course was uncomplicated.  Her final pathology revealed extramammary pagets of the left lateral labia majora with inked bilateral margins and 12 o'clock margin. The biopsies from the pruritic vulva on the posterior vagina and perinal regions revealed benign hyperplasia consistent with lichen simplex chronicus.  When I saw the patient in December, 2016 she had noticed the development of a "cyst" in the left posterior labia majora in the past month. It has decreased in size from a "kidney bean" size to now a "pea size". Tender. Increases in size when she wears pants.   On 05/04/15 she went to the OR for an excision of the posterior left labia majora which revealed a benign epidermal inclusion cyst. Random biopsies from the left and right mid labia minora and perineum were negative for LS and pagets.  Interval Hx: Biopsy performed on 8/24/17by Dr Helane Rima  of left labia majora showed extramammary pagets.  Last colonoscopy 11/16  Surgery was performed on 01/22/16 (left vulvectomy) and pagets was positive on the 9 o'clock margin.  Current Outpatient Prescriptions on File Prior to Visit  Medication Sig Dispense Refill  . Ascorbic Acid (VITAMIN C) 1000 MG tablet Take 1,000 mg by mouth daily.     .  Cholecalciferol (VITAMIN D) 2000 UNITS tablet Take 2,000 Units by mouth daily.    Marland Kitchen estradiol (MINIVELLE) 0.05 MG/24HR patch twice a week. patch    . magnesium 30 MG tablet Take 30 mg by mouth daily.     Marland Kitchen senna (SENOKOT) 8.6 MG TABS tablet Take 1 tablet (8.6 mg total) by mouth daily. 120 each 0  . Aspirin-Acetaminophen-Caffeine (EXCEDRIN PO) Take by mouth as needed. Reported on 05/28/2015    . HYDROmorphone (DILAUDID) 2 MG tablet Take 1 tablet (2 mg total) by mouth every 4 (four) hours as needed for severe pain. (Patient not taking: Reported on 02/04/2016) 30 tablet 0  . lidocaine (LIDODERM) 5 % Reported on 05/28/2015    . scopolamine (TRANSDERM-SCOP) 1 MG/3DAYS Place 1 patch (1.5 mg total) onto the skin every 3 (three) days. (Patient not taking: Reported on 02/04/2016) 10 patch 1  . TURMERIC PO Take 1 capsule by mouth daily.      No current facility-administered medications on file prior to visit.    Allergies  Allergen Reactions  . Codeine Itching, Nausea And Vomiting and Rash    "comes out like a burn/rash"   Past Medical History:  Diagnosis Date  . Arthritis   . GERD (gastroesophageal reflux disease)   . History of cervical dysplasia   . History of colon polyps   . History of gastric ulcer    2013  . History of squamous cell carcinoma excision    left elbow  . Lichen simplex chronicus   . Paget's disease of vulva  first dx 2013  . PONV (postoperative nausea and vomiting)   . Psoriasis   . Scoliosis    Past Surgical History:  Procedure Laterality Date  . BLADDER SUSPENSION  2008   sling  . BUNIONECTOMY  2008  . CARDIOVASCULAR STRESS TEST  09-30-2013   dr Marlou Porch   normal nuclear study/  no ischemia/  normal LV function and wall motion,  ef 61%  . CATARACT EXTRACTION W/ INTRAOCULAR LENS  IMPLANT, BILATERAL    . HAND SURGERY Right 11/02/12   hand reconstruction at [facility]  . TONSILLECTOMY  10  age 86  . VAGINAL HYSTERECTOMY  40  age 28  . VULVA Milagros Loll BIOPSY N/A  08/29/2014   Procedure: Georgena Spurling;  Surgeon: Everitt Amber, MD;  Location: [facility];  Service: Gynecology;  Laterality: N/A;  . VULVECTOMY N/A 08/29/2014   Procedure: WIDE LOCAL EXCISION OF VULVA;  Surgeon: Everitt Amber, MD;  Location: [place];  Service: Gynecology;  Laterality: N/A;  . VULVECTOMY  2013  . VULVECTOMY N/A 05/04/2015   Procedure: WIDE LOCAL EXCISION LEFT  VULVAR WITH BIOPSY OF RIGHT VULVA;  Surgeon: Everitt Amber, MD;  Location: [place];  Service: Gynecology;  Laterality: N/A;  . VULVECTOMY Left 01/22/2016   Procedure: PARTIAL SIMPLE VULVECTOMY;  Surgeon: Everitt Amber, MD;  Location: [facility];  Service: Gynecology;  Laterality: Left;   Family History  Problem Relation Age of Onset  . Aortic aneurysm Mother   . AAA (abdominal aortic aneurysm) Mother   . Diabetes Father   . Other Father     enlarged heart  . Arrhythmia Brother   . Heart attack Maternal Grandfather   . Arrhythmia Brother   . Thyroid cancer Daughter 49   Social History   Social History  . Marital status: Divorced    Spouse name: N/A  . Number of children: N/A  . Years of education: N/A   Occupational History  . Not on file.   Social History Main Topics  . Smoking status: Former Smoker    Packs/day: 1.00    Years: 5.00    Types: Cigarettes    Quit date: 10/05/1978  . Smokeless tobacco: Never Used  . Alcohol use 0.6 - 1.2 oz/week    1 - 2 Glasses of wine per week     Comment: occas  . Drug use: No  . Sexual activity: Not on file   Other Topics Concern  . Not on file   Social History Narrative   ** Merged History Encounter **        Review of systems: Constitutional:  She has no weight gain or weight loss. She has no fever or chills. Eyes: No blurred vision Ears, Nose, Mouth, Throat: No dizziness, headaches or changes in hearing. No mouth sores. Cardiovascular: No chest pain, palpitations or edema. Respiratory:  No  shortness of breath, wheezing or cough Gastrointestinal: She has normal bowel movements without diarrhea or constipation. She denies any nausea or vomiting. She denies blood in her stool or heart burn. Genitourinary:  She denies pelvic pain, pelvic pressure or changes in her urinary function. She has no hematuria, dysuria, or incontinence. She has no irregular vaginal bleeding or vaginal discharge Musculoskeletal: Denies muscle weakness or joint pains.  Skin:  She has no skin changes, rashes or itching Neurological:  Denies dizziness or headaches. No neuropathy, no numbness or tingling. Psychiatric:  She denies depression or anxiety. Hematologic/Lymphatic:   No easy bruising  or bleeding   Physical Exam: There were no vitals taken for this visit. General: Well dressed, well nourished in no apparent distress.   HEENT:  Normocephalic and atraumatic, no lesions.  Extraocular muscles intact. Sclerae anicteric. Pupils equal, round, reactive. No mouth sores or ulcers. Thyroid is normal size, not nodular, midline. Skin:  No lesions or rashes. Breasts:  deferred Lungs:  deferred Cardiovascular:  deferred Abdomen:  deferred Genitourinary: Incision healing well with no drainage. No visible pagets lesions Extremities: No cyanosis, clubbing or edema.  No calf tenderness or erythema. No palpable cords. Psychiatric: Mood and affect are appropriate. Neurological: Awake, alert and oriented x 3. Sensation is intact, no neuropathy.  Musculoskeletal: No pain, normal strength and range of motion.   [name], MD

## 2016-02-15 ENCOUNTER — Encounter (HOSPITAL_BASED_OUTPATIENT_CLINIC_OR_DEPARTMENT_OTHER): Payer: Self-pay | Admitting: Gynecologic Oncology

## 2016-02-15 ENCOUNTER — Encounter: Payer: Self-pay | Admitting: Gynecologic Oncology

## 2016-02-15 NOTE — Telephone Encounter (Signed)
Patient's daughter had called the office earlier stating she is having moderate pain.  Asking about something different for pain.  Pt is allergic to codeine and states the dilaudid 4 mg every six hours as needed helps some but wants to see about increasing the frequency the every four hours as needed.  Returned call to patient after discussing with Denman George.  Patient wanting to have a different medication for over the weekend in case the dilaudid does not help with her pain.  Per Dr. Denman George, the patient can try morphine IR.  Patient advised to not take and drive and to not take with any other pain medication.  Reportable signs and symptoms reviewed.  She is advised to call the office with an update on her pain.

## 2016-02-21 ENCOUNTER — Telehealth: Payer: Self-pay | Admitting: Gynecologic Oncology

## 2016-02-21 NOTE — Telephone Encounter (Signed)
Informed patient of results of postive margin (focally and microscopically) at 3 o'clock.  I do not recommend continued resection at this time. She could, in the future be considered for Moh's procedure. At present there is no macroscopic or symptomatic lesion.  Alternative treatment might be Aldara - though high rate of relapse.  [name], MD

## 2016-02-21 NOTE — Telephone Encounter (Signed)
Returned call to patient.  Patient concerned about path results.  Discussed results and informed patient she should be hearing from Dr. Denman George about her recommendations.  Advised to call for any needs.

## 2016-02-22 ENCOUNTER — Telehealth: Payer: Self-pay | Admitting: Gynecology

## 2016-02-22 ENCOUNTER — Telehealth: Payer: Self-pay | Admitting: Gynecologic Oncology

## 2016-02-22 DIAGNOSIS — T814XXA Infection following a procedure, initial encounter: Principal | ICD-10-CM

## 2016-02-22 DIAGNOSIS — IMO0001 Reserved for inherently not codable concepts without codable children: Secondary | ICD-10-CM

## 2016-02-22 MED ORDER — CEPHALEXIN 500 MG PO CAPS: 500.0000 mg | ORAL_CAPSULE | Freq: Four times a day (QID) | ORAL | 0 refills | Status: DC

## 2016-02-22 NOTE — Telephone Encounter (Signed)
Spoke with patient earlier about her concerns about a potential infection of the vulvar incision.  Patient reporting increased redness and warmth along with yellowish thick discharge.  Situation also discussed with Dr. Fermin Schwab who would like the patient to begin taking Keflex 4 times a day for 7 days.  She is advised to call our office with an update.  Reportable signs and symptoms reviewed.  All questions answered.

## 2016-02-22 NOTE — Telephone Encounter (Signed)
Patient called requesting further discussion regarding the positive margin at her most recent surgery. I explained to the patient that Paget's cells and exist in multiple sites on the vulva and are not necessarily contiguous. Therefore, my management approach is to excise obvious symptomatic areas but that it is unreasonable to expect negative margins in most cases. I did explain that in order to completely excise all Paget's cells, the vulvar skin would have to be removed in its entirety and then skin grafted. In my opinion, this would be excessive.  The patient seems to be reassured regarding this discussion and will return for follow-up with the expectation that we would only reoperate should she develop new symptomatic lesions.

## 2016-02-25 ENCOUNTER — Telehealth: Payer: Self-pay | Admitting: Gynecologic Oncology

## 2016-02-25 DIAGNOSIS — IMO0001 Reserved for inherently not codable concepts without codable children: Secondary | ICD-10-CM

## 2016-02-25 DIAGNOSIS — T814XXA Infection following a procedure, initial encounter: Principal | ICD-10-CM

## 2016-02-25 MED ORDER — CLINDAMYCIN HCL 300 MG PO CAPS: 300.0000 mg | ORAL_CAPSULE | Freq: Three times a day (TID) | ORAL | 0 refills | Status: DC

## 2016-02-25 NOTE — Telephone Encounter (Signed)
Returned call to patient.  She states when she woke up on Saturday she had diarrhea, low grade fever, and large amount of drainage from her vulva.  She reports doing somewhat better with no diarrhea and improvement in the amount of drainage but states there is an odor.  Situation discussed with Dr. Denman George and plan to add clindamycin to the keflex she is taking.  Reportable signs and symptoms reviewed.  She is to call our office with an update.

## 2016-02-27 ENCOUNTER — Ambulatory Visit: Payer: BC Managed Care – PPO | Attending: Gynecologic Oncology | Admitting: Gynecologic Oncology

## 2016-02-27 ENCOUNTER — Encounter: Payer: Self-pay | Admitting: Gynecologic Oncology

## 2016-02-27 VITALS — BP 145/57 | HR 97 | Temp 98.5°F | Resp 18 | Wt 117.8 lb

## 2016-02-27 DIAGNOSIS — Z7982 Long term (current) use of aspirin: Secondary | ICD-10-CM | POA: Insufficient documentation

## 2016-02-27 DIAGNOSIS — L280 Lichen simplex chronicus: Secondary | ICD-10-CM | POA: Insufficient documentation

## 2016-02-27 DIAGNOSIS — C519 Malignant neoplasm of vulva, unspecified: Secondary | ICD-10-CM

## 2016-02-27 DIAGNOSIS — N762 Acute vulvitis: Secondary | ICD-10-CM | POA: Diagnosis not present

## 2016-02-27 DIAGNOSIS — Z87891 Personal history of nicotine dependence: Secondary | ICD-10-CM | POA: Diagnosis not present

## 2016-02-27 DIAGNOSIS — C4499 Other specified malignant neoplasm of skin, unspecified: Secondary | ICD-10-CM

## 2016-02-27 DIAGNOSIS — Z79899 Other long term (current) drug therapy: Secondary | ICD-10-CM | POA: Insufficient documentation

## 2016-02-27 NOTE — Patient Instructions (Signed)
You can purchase a sitz bath at the drug store and perform twice daily.  Apply neosporin to the incision as well.  Continue with antibiotics and plan to follow up as scheduled.

## 2016-02-27 NOTE — Progress Notes (Signed)
GYN ONC FOLLOW UP  Assessment:    60 y.o. 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   S/p simple partial left vulvectomy on 08/29/14 and 01/22/16.  S/p wide local excision of left labia on 02/14/16 for positive margins pagets with positive margin at 3 o'clock. Wound separation and cellulitis.  Plan: Continue oral keflex and clinda. Sitz baths Neosporin ointment after toileting.  Will reinspect next week. Counseled regarding symptoms of progression of cellulitis (fevers, spreading erythema).    HPI:  Stephanie Sanchez is a 51 y.o. year old initially seen in consultation on 07/02/12 for extramammary pagets.  She then underwent a simple partial left vulvectomy on 99991111 without complications.  Her postoperative course was uncomplicated.  Her final pathology revealed extramammary pagets of the left lateral labia majora with inked bilateral margins and 12 o'clock margin. The biopsies from the pruritic vulva on the posterior vagina and perinal regions revealed benign hyperplasia consistent with lichen simplex chronicus.  When I saw the patient in December, 2016 she had noticed the development of a "cyst" in the left posterior labia majora in the past month. It has decreased in size from a "kidney bean" size to now a "pea size". Tender. Increases in size when she wears pants.   On 05/04/15 she went to the OR for an excision of the posterior left labia majora which revealed a benign epidermal inclusion cyst. Random biopsies from the left and right mid labia minora and perineum were negative for LS and pagets.  Biopsy performed on 8/24/17by Dr Helane Rima  of left labia majora showed extramammary pagets.  Last colonoscopy 11/16  Surgery was performed on 01/22/16 (left vulvectomy) and pagets was positive on the 9 o'clock margin.  Interval Hx: The patient requested re-exicision and was taken back on 02/14/16, Pathology showed a focal positive  margin at 3 o'clock. In the past 5 days she has experienced increased pain, drainage and separation of incision. Temp 99.  Current Outpatient Prescriptions on File Prior to Visit  Medication Sig Dispense Refill  . cephALEXin (KEFLEX) 500 MG capsule Take 1 capsule (500 mg total) by mouth 4 (four) times daily. 28 capsule 0  . clindamycin (CLEOCIN) 300 MG capsule Take 1 capsule (300 mg total) by mouth 3 (three) times daily. 21 capsule 0  . estradiol (MINIVELLE) 0.05 MG/24HR patch twice a week. patch    . Ascorbic Acid (VITAMIN C) 1000 MG tablet Take 1,000 mg by mouth daily.     . Aspirin-Acetaminophen-Caffeine (EXCEDRIN PO) Take by mouth as needed. Reported on 05/28/2015    . Cholecalciferol (VITAMIN D) 2000 UNITS tablet Take 2,000 Units by mouth daily.    Marland Kitchen HYDROmorphone (DILAUDID) 2 MG tablet Take 1 tablet (2 mg total) by mouth every 4 (four) hours as needed for severe pain. (Patient not taking: Reported on 02/27/2016) 30 tablet 0  . HYDROmorphone (DILAUDID) 4 MG tablet Take 1 tablet (4 mg total) by mouth every 6 (six) hours as needed for severe pain. (Patient not taking: Reported on 02/27/2016) 30 tablet 0  . lidocaine (LIDODERM) 5 % Reported on 05/28/2015    . magnesium 30 MG tablet Take 30 mg by mouth daily.     Marland Kitchen morphine (MSIR) 15 MG tablet Take 1 tablet (15 mg total) by mouth every 6 (six) hours as needed for severe pain. (Patient not taking: Reported on 02/27/2016) 30 tablet 0  . scopolamine (TRANSDERM-SCOP) 1 MG/3DAYS Place 1 patch (1.5 mg  total) onto the skin every 3 (three) days. (Patient not taking: Reported on 02/27/2016) 10 patch 1  . senna (SENOKOT) 8.6 MG TABS tablet Take 1 tablet (8.6 mg total) by mouth daily. (Patient not taking: Reported on 02/27/2016) 120 each 0  . TURMERIC PO Take 1 capsule by mouth daily.      No current facility-administered medications on file prior to visit.    Allergies  Allergen Reactions  . Codeine Itching, Nausea And Vomiting and Rash    "comes out  like a burn/rash"   Past Medical History:  Diagnosis Date  . Arthritis   . GERD (gastroesophageal reflux disease)   . History of cervical dysplasia   . History of colon polyps   . History of gastric ulcer    2013  . History of squamous cell carcinoma excision    left elbow  . Lichen simplex chronicus   . Paget's disease of vulva    first dx 2013  . PONV (postoperative nausea and vomiting)   . Psoriasis   . Scoliosis    Past Surgical History:  Procedure Laterality Date  . BLADDER SUSPENSION  2008   sling  . BUNIONECTOMY  2008  . CARDIOVASCULAR STRESS TEST  09-30-2013   dr Marlou Porch   normal nuclear study/  no ischemia/  normal LV function and wall motion,  ef 61%  . CATARACT EXTRACTION W/ INTRAOCULAR LENS  IMPLANT, BILATERAL    . HAND SURGERY Right 11/02/12   hand reconstruction at [facility]  . TONSILLECTOMY  60  age 31  . VAGINAL HYSTERECTOMY  62  age 55  . VULVA Milagros Loll BIOPSY N/A 08/29/2014   Procedure: Georgena Spurling;  Surgeon: Everitt Amber, MD;  Location: [facility];  Service: Gynecology;  Laterality: N/A;  . VULVECTOMY N/A 08/29/2014   Procedure: WIDE LOCAL EXCISION OF VULVA;  Surgeon: Everitt Amber, MD;  Location: [place];  Service: Gynecology;  Laterality: N/A;  . VULVECTOMY  2013  . VULVECTOMY N/A 05/04/2015   Procedure: WIDE LOCAL EXCISION LEFT  VULVAR WITH BIOPSY OF RIGHT VULVA;  Surgeon: Everitt Amber, MD;  Location: [place];  Service: Gynecology;  Laterality: N/A;  . VULVECTOMY Left 01/22/2016   Procedure: PARTIAL SIMPLE VULVECTOMY;  Surgeon: Everitt Amber, MD;  Location: [facility];  Service: Gynecology;  Laterality: Left;  Marland Kitchen VULVECTOMY N/A 02/14/2016   Procedure: WIDE EXCISION VULVECTOMY;  Surgeon: Everitt Amber, MD;  Location: [facility];  Service: Gynecology;  Laterality: N/A;   Family History  Problem Relation Age of Onset  . Aortic aneurysm Mother   . AAA (abdominal aortic aneurysm)  Mother   . Diabetes Father   . Other Father     enlarged heart  . Arrhythmia Brother   . Heart attack Maternal Grandfather   . Arrhythmia Brother   . Thyroid cancer Daughter 6   Social History   Social History  . Marital status: Divorced    Spouse name: N/A  . Number of children: N/A  . Years of education: N/A   Occupational History  . Not on file.   Social History Main Topics  . Smoking status: Former Smoker    Packs/day: 1.00    Years: 5.00    Types: Cigarettes    Quit date: 10/05/1978  . Smokeless tobacco: Never Used  . Alcohol use No     Comment: occas  . Drug use: No  . Sexual activity: Not on file   Other Topics  Concern  . Not on file   Social History Narrative   ** Merged History Encounter **        Review of systems: Constitutional:  She has no weight gain or weight loss. She has no fever or chills. Eyes: No blurred vision Ears, Nose, Mouth, Throat: No dizziness, headaches or changes in hearing. No mouth sores. Cardiovascular: No chest pain, palpitations or edema. Respiratory:  No shortness of breath, wheezing or cough Gastrointestinal: She has normal bowel movements without diarrhea or constipation. She denies any nausea or vomiting. She denies blood in her stool or heart burn. Genitourinary:  She denies pelvic pain, pelvic pressure or changes in her urinary function. She has no hematuria, dysuria, or incontinence. She has no irregular vaginal bleeding or vaginal discharge Musculoskeletal: Denies muscle weakness or joint pains.  Skin:  She has no skin changes, rashes or itching Neurological:  Denies dizziness or headaches. No neuropathy, no numbness or tingling. Psychiatric:  She denies depression or anxiety. Hematologic/Lymphatic:   No easy bruising or bleeding   Physical Exam: Blood pressure (!) 145/57, pulse 97, temperature 98.5 F (36.9 C), temperature source Oral, resp. rate 18, weight 117 lb 12.8 oz (53.4 kg), SpO2 98 %. General: Well dressed,  well nourished in no apparent distress.   HEENT:  Normocephalic and atraumatic, no lesions.  Extraocular muscles intact. Sclerae anicteric. Pupils equal, round, reactive. No mouth sores or ulcers. Thyroid is normal size, not nodular, midline. Skin:  No lesions or rashes. Breasts:  deferred Lungs:  deferred Cardiovascular:  deferred Abdomen:  deferred Genitourinary: Left vulvar erythematous but no spreading on surrounding skin. No fluctuance or undrained fluid. Separation of wound centrally. Extremities: No cyanosis, clubbing or edema.  No calf tenderness or erythema. No palpable cords. Psychiatric: Mood and affect are appropriate. Neurological: Awake, alert and oriented x 3. Sensation is intact, no neuropathy.  Musculoskeletal: No pain, normal strength and range of motion.   [name], MD

## 2016-03-03 ENCOUNTER — Encounter: Payer: Self-pay | Admitting: Gynecologic Oncology

## 2016-03-03 ENCOUNTER — Ambulatory Visit: Payer: BC Managed Care – PPO | Attending: Gynecologic Oncology | Admitting: Gynecologic Oncology

## 2016-03-03 VITALS — BP 144/82 | HR 95 | Temp 98.2°F | Resp 18 | Wt 119.5 lb

## 2016-03-03 DIAGNOSIS — L280 Lichen simplex chronicus: Secondary | ICD-10-CM | POA: Insufficient documentation

## 2016-03-03 DIAGNOSIS — Z79899 Other long term (current) drug therapy: Secondary | ICD-10-CM | POA: Insufficient documentation

## 2016-03-03 DIAGNOSIS — IMO0001 Reserved for inherently not codable concepts without codable children: Secondary | ICD-10-CM

## 2016-03-03 DIAGNOSIS — C519 Malignant neoplasm of vulva, unspecified: Secondary | ICD-10-CM

## 2016-03-03 DIAGNOSIS — T814XXA Infection following a procedure, initial encounter: Secondary | ICD-10-CM

## 2016-03-03 DIAGNOSIS — Z7982 Long term (current) use of aspirin: Secondary | ICD-10-CM | POA: Diagnosis not present

## 2016-03-03 DIAGNOSIS — Z87891 Personal history of nicotine dependence: Secondary | ICD-10-CM | POA: Insufficient documentation

## 2016-03-03 DIAGNOSIS — N762 Acute vulvitis: Secondary | ICD-10-CM | POA: Diagnosis not present

## 2016-03-03 DIAGNOSIS — C4499 Other specified malignant neoplasm of skin, unspecified: Secondary | ICD-10-CM

## 2016-03-03 MED ORDER — CEPHALEXIN 500 MG PO CAPS: 500.0000 mg | ORAL_CAPSULE | Freq: Four times a day (QID) | ORAL | 0 refills | Status: DC

## 2016-03-03 MED ORDER — CLINDAMYCIN HCL 300 MG PO CAPS: 300.0000 mg | ORAL_CAPSULE | Freq: Three times a day (TID) | ORAL | 0 refills | Status: DC

## 2016-03-03 NOTE — Patient Instructions (Signed)
Plan to continue with another week of keflex and clindamycin.  Dr. Denman George recommended the [place] Diet today.  Plan to follow up on the 15th or sooner if needed.  Please call our office for any questions, concerns, or issues with healing.

## 2016-03-03 NOTE — Progress Notes (Signed)
GYN ONC FOLLOW UP  Assessment:    60 y.o. 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   S/p simple partial left vulvectomy on 08/29/14 and 01/22/16.  S/p wide local excision of left labia on 02/14/16 for positive margins pagets with positive margin at 3 o'clock. Wound separation and cellulitis - healing appropriately with oral antibiotics.  Plan: Continue oral keflex and clinda for 1 additional week. Then recommend vulvar inspection to confirm that vulva is continuing to heal normally off of antibiotic therapy. Sitz baths Neosporin ointment after toileting.    HPI:  Stephanie Sanchez is a 86 y.o. year old initially seen in consultation on 07/02/12 for extramammary pagets.  She then underwent a simple partial left vulvectomy on 99991111 without complications.  Her postoperative course was uncomplicated.  Her final pathology revealed extramammary pagets of the left lateral labia majora with inked bilateral margins and 12 o'clock margin. The biopsies from the pruritic vulva on the posterior vagina and perinal regions revealed benign hyperplasia consistent with lichen simplex chronicus.  When I saw the patient in December, 2016 she had noticed the development of a "cyst" in the left posterior labia majora in the past month. It has decreased in size from a "kidney bean" size to now a "pea size". Tender. Increases in size when she wears pants.   On 05/04/15 she went to the OR for an excision of the posterior left labia majora which revealed a benign epidermal inclusion cyst. Random biopsies from the left and right mid labia minora and perineum were negative for LS and pagets.  Biopsy performed on 8/24/17by Dr Helane Rima  of left labia majora showed extramammary pagets.  Last colonoscopy 11/16  Surgery was performed on 01/22/16 (left vulvectomy) and pagets was positive on the 9 o'clock margin.  Interval Hx: The patient requested re-exicision and  was taken back on 02/14/16, Pathology showed a focal positive margin at 3 o'clock. She developed postop cellulitis and separation of the wound on week 1 which was initially treated with Keflex. Clinda was added after day 4 of antibiotics when symptoms failed to improve. She completed Keflex on 02/29/16 but is continuing to take Cleocin. She feels much better in the past 24-48 hours. Less drainage. No fevers. Much less pain.  Current Outpatient Prescriptions on File Prior to Visit  Medication Sig Dispense Refill  . Ascorbic Acid (VITAMIN C) 1000 MG tablet Take 1,000 mg by mouth daily.     . Aspirin-Acetaminophen-Caffeine (EXCEDRIN PO) Take by mouth as needed. Reported on 05/28/2015    . Cholecalciferol (VITAMIN D) 2000 UNITS tablet Take 2,000 Units by mouth daily.    . clindamycin (CLEOCIN) 300 MG capsule Take 1 capsule (300 mg total) by mouth 3 (three) times daily. 21 capsule 0  . estradiol (MINIVELLE) 0.05 MG/24HR patch twice a week. patch    . HYDROmorphone (DILAUDID) 2 MG tablet Take 1 tablet (2 mg total) by mouth every 4 (four) hours as needed for severe pain. (Patient not taking: Reported on 02/27/2016) 30 tablet 0  . HYDROmorphone (DILAUDID) 4 MG tablet Take 1 tablet (4 mg total) by mouth every 6 (six) hours as needed for severe pain. (Patient not taking: Reported on 02/27/2016) 30 tablet 0  . lidocaine (LIDODERM) 5 % Reported on 05/28/2015    . magnesium 30 MG tablet Take 30 mg by mouth daily.     Marland Kitchen morphine (MSIR) 15 MG tablet Take 1 tablet (15 mg total)  by mouth every 6 (six) hours as needed for severe pain. (Patient not taking: Reported on 02/27/2016) 30 tablet 0  . scopolamine (TRANSDERM-SCOP) 1 MG/3DAYS Place 1 patch (1.5 mg total) onto the skin every 3 (three) days. (Patient not taking: Reported on 02/27/2016) 10 patch 1  . senna (SENOKOT) 8.6 MG TABS tablet Take 1 tablet (8.6 mg total) by mouth daily. (Patient not taking: Reported on 02/27/2016) 120 each 0  . TURMERIC PO Take 1 capsule  by mouth daily.      No current facility-administered medications on file prior to visit.    Allergies  Allergen Reactions  . Codeine Itching, Nausea And Vomiting and Rash    "comes out like a burn/rash"   Past Medical History:  Diagnosis Date  . Arthritis   . GERD (gastroesophageal reflux disease)   . History of cervical dysplasia   . History of colon polyps   . History of gastric ulcer    2013  . History of squamous cell carcinoma excision    left elbow  . Lichen simplex chronicus   . Paget's disease of vulva    first dx 2013  . PONV (postoperative nausea and vomiting)   . Psoriasis   . Scoliosis    Past Surgical History:  Procedure Laterality Date  . BLADDER SUSPENSION  2008   sling  . BUNIONECTOMY  2008  . CARDIOVASCULAR STRESS TEST  09-30-2013   dr Marlou Porch   normal nuclear study/  no ischemia/  normal LV function and wall motion,  ef 61%  . CATARACT EXTRACTION W/ INTRAOCULAR LENS  IMPLANT, BILATERAL    . HAND SURGERY Right 11/02/12   hand reconstruction at [facility]  . TONSILLECTOMY  44  age 52  . VAGINAL HYSTERECTOMY  89  age 75  . VULVA Milagros Loll BIOPSY N/A 08/29/2014   Procedure: Georgena Spurling;  Surgeon: Everitt Amber, MD;  Location: [facility];  Service: Gynecology;  Laterality: N/A;  . VULVECTOMY N/A 08/29/2014   Procedure: WIDE LOCAL EXCISION OF VULVA;  Surgeon: Everitt Amber, MD;  Location: [place];  Service: Gynecology;  Laterality: N/A;  . VULVECTOMY  2013  . VULVECTOMY N/A 05/04/2015   Procedure: WIDE LOCAL EXCISION LEFT  VULVAR WITH BIOPSY OF RIGHT VULVA;  Surgeon: Everitt Amber, MD;  Location: [place];  Service: Gynecology;  Laterality: N/A;  . VULVECTOMY Left 01/22/2016   Procedure: PARTIAL SIMPLE VULVECTOMY;  Surgeon: Everitt Amber, MD;  Location: [facility];  Service: Gynecology;  Laterality: Left;  Marland Kitchen VULVECTOMY N/A 02/14/2016   Procedure: WIDE EXCISION VULVECTOMY;  Surgeon: Everitt Amber, MD;   Location: [facility];  Service: Gynecology;  Laterality: N/A;   Family History  Problem Relation Age of Onset  . Aortic aneurysm Mother   . AAA (abdominal aortic aneurysm) Mother   . Diabetes Father   . Other Father     enlarged heart  . Arrhythmia Brother   . Heart attack Maternal Grandfather   . Arrhythmia Brother   . Thyroid cancer Daughter 73   Social History   Social History  . Marital status: Divorced    Spouse name: N/A  . Number of children: N/A  . Years of education: N/A   Occupational History  . Not on file.   Social History Main Topics  . Smoking status: Former Smoker    Packs/day: 1.00    Years: 5.00    Types: Cigarettes    Quit date: 10/05/1978  .  Smokeless tobacco: Never Used  . Alcohol use No     Comment: occas  . Drug use: No  . Sexual activity: Not on file   Other Topics Concern  . Not on file   Social History Narrative   ** Merged History Encounter **        Review of systems: Constitutional:  She has no weight gain or weight loss. She has no fever or chills. Eyes: No blurred vision Ears, Nose, Mouth, Throat: No dizziness, headaches or changes in hearing. No mouth sores. Cardiovascular: No chest pain, palpitations or edema. Respiratory:  No shortness of breath, wheezing or cough Gastrointestinal: She has normal bowel movements without diarrhea or constipation. She denies any nausea or vomiting. She denies blood in her stool or heart burn. Genitourinary:  She denies pelvic pain, pelvic pressure or changes in her urinary function. She has no hematuria, dysuria, or incontinence. She has no irregular vaginal bleeding or vaginal discharge Musculoskeletal: Denies muscle weakness or joint pains.  Skin:  She has no skin changes, rashes or itching Neurological:  Denies dizziness or headaches. No neuropathy, no numbness or tingling. Psychiatric:  She denies depression or anxiety. Hematologic/Lymphatic:   No easy bruising or  bleeding   Physical Exam: Blood pressure (!) 144/82, pulse 95, temperature 98.2 F (36.8 C), temperature source Oral, resp. rate 18, weight 119 lb 8 oz (54.2 kg), SpO2 100 %. General: Well dressed, well nourished in no apparent distress.   HEENT:  Normocephalic and atraumatic, no lesions.  Extraocular muscles intact. Sclerae anicteric. Pupils equal, round, reactive. No mouth sores or ulcers. Thyroid is normal size, not nodular, midline. Skin:  No lesions or rashes. Breasts:  deferred Lungs:  deferred Cardiovascular:  deferred Abdomen:  deferred Genitourinary: Left vulvar no longer erythematous with no spreading on surrounding skin. No fluctuance or undrained fluid. Separation of wound centrally. Discharge consistent with fibrinous exudate. Extremities: No cyanosis, clubbing or edema.  No calf tenderness or erythema. No palpable cords. Psychiatric: Mood and affect are appropriate. Neurological: Awake, alert and oriented x 3. Sensation is intact, no neuropathy.  Musculoskeletal: No pain, normal strength and range of motion.   [name], MD

## 2016-03-17 ENCOUNTER — Other Ambulatory Visit: Payer: Self-pay | Admitting: Gynecologic Oncology

## 2016-03-17 DIAGNOSIS — C4499 Other specified malignant neoplasm of skin, unspecified: Secondary | ICD-10-CM

## 2016-03-17 DIAGNOSIS — C519 Malignant neoplasm of vulva, unspecified: Secondary | ICD-10-CM

## 2016-03-19 ENCOUNTER — Encounter: Payer: Self-pay | Admitting: Gynecologic Oncology

## 2016-03-19 ENCOUNTER — Ambulatory Visit: Payer: BC Managed Care – PPO | Attending: Gynecologic Oncology | Admitting: Gynecologic Oncology

## 2016-03-19 ENCOUNTER — Other Ambulatory Visit (HOSPITAL_BASED_OUTPATIENT_CLINIC_OR_DEPARTMENT_OTHER): Payer: BC Managed Care – PPO

## 2016-03-19 VITALS — BP 142/82 | HR 82 | Temp 98.2°F | Resp 18 | Ht 63.0 in | Wt 118.3 lb

## 2016-03-19 DIAGNOSIS — L280 Lichen simplex chronicus: Secondary | ICD-10-CM | POA: Diagnosis not present

## 2016-03-19 DIAGNOSIS — C519 Malignant neoplasm of vulva, unspecified: Secondary | ICD-10-CM

## 2016-03-19 DIAGNOSIS — Z79899 Other long term (current) drug therapy: Secondary | ICD-10-CM | POA: Insufficient documentation

## 2016-03-19 DIAGNOSIS — C4499 Other specified malignant neoplasm of skin, unspecified: Secondary | ICD-10-CM

## 2016-03-19 DIAGNOSIS — Z87891 Personal history of nicotine dependence: Secondary | ICD-10-CM | POA: Insufficient documentation

## 2016-03-19 DIAGNOSIS — Z7982 Long term (current) use of aspirin: Secondary | ICD-10-CM | POA: Diagnosis not present

## 2016-03-19 LAB — BASIC METABOLIC PANEL
ANION GAP: 9 meq/L (ref 3–11)
BUN: 17.6 mg/dL (ref 7.0–26.0)
CALCIUM: 10.2 mg/dL (ref 8.4–10.4)
CHLORIDE: 106 meq/L (ref 98–109)
CO2: 26 mEq/L (ref 22–29)
CREATININE: 0.8 mg/dL (ref 0.6–1.1)
EGFR: 86 mL/min/{1.73_m2} — ABNORMAL LOW (ref >90)
Glucose: 89 mg/dl (ref 70–140)
Potassium: 4.7 mEq/L (ref 3.5–5.1)
SODIUM: 142 meq/L (ref 136–145)

## 2016-03-19 LAB — CBC WITH DIFFERENTIAL/PLATELET
BASO%: 0.7 % (ref 0.0–2.0)
Basophils Absolute: 0 10*3/uL (ref 0.0–0.1)
EOS ABS: 0.2 10*3/uL (ref 0.0–0.5)
EOS%: 3.8 % (ref 0.0–7.0)
HCT: 49.2 % — ABNORMAL HIGH (ref 34.8–46.6)
HGB: 16.1 g/dL — ABNORMAL HIGH (ref 11.6–15.9)
LYMPH%: 30.7 % (ref 14.0–49.7)
MCH: 31 pg (ref 25.1–34.0)
MCHC: 32.8 g/dL (ref 31.5–36.0)
MCV: 94.5 fL (ref 79.5–101.0)
MONO#: 0.4 10*3/uL (ref 0.1–0.9)
MONO%: 9.6 % (ref 0.0–14.0)
NEUT%: 55.2 % (ref 38.4–76.8)
NEUTROS ABS: 2.4 10*3/uL (ref 1.5–6.5)
PLATELETS: 242 10*3/uL (ref 145–400)
RBC: 5.21 10*6/uL (ref 3.70–5.45)
RDW: 12.2 % (ref 11.2–14.5)
WBC: 4.3 10*3/uL (ref 3.9–10.3)
lymph#: 1.3 10*3/uL (ref 0.9–3.3)

## 2016-03-19 NOTE — Patient Instructions (Addendum)
Plan to follow up on February 14 , 2018 as discussed with Dr Everitt Amber.  Call with any changes , questions or concerns.  Thank you

## 2016-03-19 NOTE — Progress Notes (Signed)
GYN ONC FOLLOW UP  Assessment:    60 y.o. 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   S/p simple partial left vulvectomy on 08/29/14 and 01/22/16.  S/p wide local excision of left labia on 02/14/16 for positive margins pagets with positive margin at 3 o'clock. Wound separation and cellulitis - healing appropriately with oral antibiotics.  Plan: Recheck vulva in 3 months for Pagets surveillance. Restart clobetasol prn in 2 weeks for lichen (based on symptoms).  No further antibiotics indicated. Recommend follow-up with her PCP regarding starting anti-hypertensive. Return to work on December 14th.    HPI:  Stephanie Sanchez is a 89 y.o. year old initially seen in consultation on 07/02/12 for extramammary pagets.  She then underwent a simple partial left vulvectomy on 99991111 without complications.  Her postoperative course was uncomplicated.  Her final pathology revealed extramammary pagets of the left lateral labia majora with inked bilateral margins and 12 o'clock margin. The biopsies from the pruritic vulva on the posterior vagina and perinal regions revealed benign hyperplasia consistent with lichen simplex chronicus.  When I saw the patient in December, 2016 she had noticed the development of a "cyst" in the left posterior labia majora in the past month. It has decreased in size from a "kidney bean" size to now a "pea size". Tender. Increases in size when she wears pants.   On 05/04/15 she went to the OR for an excision of the posterior left labia majora which revealed a benign epidermal inclusion cyst. Random biopsies from the left and right mid labia minora and perineum were negative for LS and pagets.  Biopsy performed on 8/24/17by Dr Helane Rima  of left labia majora showed extramammary pagets.  Last colonoscopy 11/16  Surgery was performed on 01/22/16 (left vulvectomy) and pagets was positive on the 9 o'clock margin.  Interval  Hx: The patient requested re-exicision and was taken back on 02/14/16, Pathology showed a focal positive margin at 3 o'clock. She developed postop cellulitis and separation of the wound on week 1 which was initially treated with Keflex. Clinda was added after day 4 of antibiotics when symptoms failed to improve. She completed 2 weeks of Keflex and Cleocin and feels much better.  Current Outpatient Prescriptions on File Prior to Visit  Medication Sig Dispense Refill  . estradiol (MINIVELLE) 0.05 MG/24HR patch twice a week. patch    . Ascorbic Acid (VITAMIN C) 1000 MG tablet Take 1,000 mg by mouth daily.     . Aspirin-Acetaminophen-Caffeine (EXCEDRIN PO) Take by mouth as needed. Reported on 05/28/2015    . Cholecalciferol (VITAMIN D) 2000 UNITS tablet Take 2,000 Units by mouth daily.    . magnesium 30 MG tablet Take 30 mg by mouth daily.     . TURMERIC PO Take 1 capsule by mouth daily.      No current facility-administered medications on file prior to visit.    Allergies  Allergen Reactions  . Codeine Itching, Nausea And Vomiting and Rash    "comes out like a burn/rash"   Past Medical History:  Diagnosis Date  . Arthritis   . GERD (gastroesophageal reflux disease)   . History of cervical dysplasia   . History of colon polyps   . History of gastric ulcer    2013  . History of squamous cell carcinoma excision    left elbow  . Lichen simplex chronicus   . Paget's disease of vulva    first dx  2013  . PONV (postoperative nausea and vomiting)   . Psoriasis   . Scoliosis    Past Surgical History:  Procedure Laterality Date  . BLADDER SUSPENSION  2008   sling  . BUNIONECTOMY  2008  . CARDIOVASCULAR STRESS TEST  09-30-2013   dr Marlou Porch   normal nuclear study/  no ischemia/  normal LV function and wall motion,  ef 61%  . CATARACT EXTRACTION W/ INTRAOCULAR LENS  IMPLANT, BILATERAL    . HAND SURGERY Right 11/02/12   hand reconstruction at [facility]  . TONSILLECTOMY  75  age 70  . VAGINAL  HYSTERECTOMY  40  age 71  . VULVA Milagros Loll BIOPSY N/A 08/29/2014   Procedure: Georgena Spurling;  Surgeon: Everitt Amber, MD;  Location: [facility];  Service: Gynecology;  Laterality: N/A;  . VULVECTOMY N/A 08/29/2014   Procedure: WIDE LOCAL EXCISION OF VULVA;  Surgeon: Everitt Amber, MD;  Location: [place];  Service: Gynecology;  Laterality: N/A;  . VULVECTOMY  2013  . VULVECTOMY N/A 05/04/2015   Procedure: WIDE LOCAL EXCISION LEFT  VULVAR WITH BIOPSY OF RIGHT VULVA;  Surgeon: Everitt Amber, MD;  Location: [place];  Service: Gynecology;  Laterality: N/A;  . VULVECTOMY Left 01/22/2016   Procedure: PARTIAL SIMPLE VULVECTOMY;  Surgeon: Everitt Amber, MD;  Location: [facility];  Service: Gynecology;  Laterality: Left;  Marland Kitchen VULVECTOMY N/A 02/14/2016   Procedure: WIDE EXCISION VULVECTOMY;  Surgeon: Everitt Amber, MD;  Location: [facility];  Service: Gynecology;  Laterality: N/A;   Family History  Problem Relation Age of Onset  . Aortic aneurysm Mother   . AAA (abdominal aortic aneurysm) Mother   . Diabetes Father   . Other Father     enlarged heart  . Arrhythmia Brother   . Heart attack Maternal Grandfather   . Arrhythmia Brother   . Thyroid cancer Daughter 55   Social History   Social History  . Marital status: Divorced    Spouse name: N/A  . Number of children: N/A  . Years of education: N/A   Occupational History  . Not on file.   Social History Main Topics  . Smoking status: Former Smoker    Packs/day: 1.00    Years: 5.00    Types: Cigarettes    Quit date: 10/05/1978  . Smokeless tobacco: Never Used  . Alcohol use No     Comment: occas  . Drug use: No  . Sexual activity: Not on file   Other Topics Concern  . Not on file   Social History Narrative   ** Merged History Encounter **        Review of systems: Constitutional:  She has no weight gain or weight loss. She has no fever or chills. Eyes: No  blurred vision Ears, Nose, Mouth, Throat: No dizziness, headaches or changes in hearing. No mouth sores. Cardiovascular: No chest pain, palpitations or edema. Respiratory:  No shortness of breath, wheezing or cough Gastrointestinal: She has normal bowel movements without diarrhea or constipation. She denies any nausea or vomiting. She denies blood in her stool or heart burn. Genitourinary:  She denies pelvic pain, pelvic pressure or changes in her urinary function. She has no hematuria, dysuria, or incontinence. She has no irregular vaginal bleeding or vaginal discharge Musculoskeletal: Denies muscle weakness or joint pains.  Skin:  She has no skin changes, rashes or itching Neurological:  Denies dizziness or headaches. No neuropathy, no numbness or tingling. Psychiatric:  She denies depression or anxiety. Hematologic/Lymphatic:   No easy bruising or bleeding   Physical Exam: Blood pressure (!) 142/82, pulse 82, temperature 98.2 F (36.8 C), temperature source Oral, resp. rate 18, height 5\' 3"  (1.6 m), weight 118 lb 4.8 oz (53.7 kg), SpO2 99 %. General: Well dressed, well nourished in no apparent distress.   HEENT:  Normocephalic and atraumatic, no lesions.  Extraocular muscles intact. Sclerae anicteric. Pupils equal, round, reactive. No mouth sores or ulcers. Thyroid is normal size, not nodular, midline. Skin:  No lesions or rashes. Breasts:  deferred Lungs:  deferred Cardiovascular:  deferred Abdomen:  deferred Genitourinary: Left vulvar no longer erythematous with no spreading on surrounding skin. No fluctuance or undrained fluid. No open wound or ulceration - completely healed. Extremities: No cyanosis, clubbing or edema.  No calf tenderness or erythema. No palpable cords. Psychiatric: Mood and affect are appropriate. Neurological: Awake, alert and oriented x 3. Sensation is intact, no neuropathy.  Musculoskeletal: No pain, normal strength and range of motion.   [name], MD

## 2016-06-10 ENCOUNTER — Other Ambulatory Visit: Payer: Self-pay | Admitting: Internal Medicine

## 2016-06-10 DIAGNOSIS — Z8249 Family history of ischemic heart disease and other diseases of the circulatory system: Secondary | ICD-10-CM

## 2016-06-18 ENCOUNTER — Encounter: Payer: Self-pay | Admitting: Gynecologic Oncology

## 2016-06-18 ENCOUNTER — Ambulatory Visit: Payer: BC Managed Care – PPO | Attending: Gynecologic Oncology | Admitting: Gynecologic Oncology

## 2016-06-18 VITALS — BP 131/90 | HR 77 | Temp 98.1°F | Resp 18 | Ht 63.0 in | Wt 120.0 lb

## 2016-06-18 DIAGNOSIS — Z87891 Personal history of nicotine dependence: Secondary | ICD-10-CM | POA: Diagnosis not present

## 2016-06-18 DIAGNOSIS — L280 Lichen simplex chronicus: Secondary | ICD-10-CM | POA: Insufficient documentation

## 2016-06-18 DIAGNOSIS — D071 Carcinoma in situ of vulva: Secondary | ICD-10-CM

## 2016-06-18 DIAGNOSIS — Z79899 Other long term (current) drug therapy: Secondary | ICD-10-CM | POA: Insufficient documentation

## 2016-06-18 DIAGNOSIS — C4499 Other specified malignant neoplasm of skin, unspecified: Secondary | ICD-10-CM

## 2016-06-18 DIAGNOSIS — C519 Malignant neoplasm of vulva, unspecified: Secondary | ICD-10-CM | POA: Diagnosis not present

## 2016-06-18 DIAGNOSIS — Z7982 Long term (current) use of aspirin: Secondary | ICD-10-CM | POA: Diagnosis not present

## 2016-06-18 MED ORDER — CLOBETASOL PROPIONATE 0.05 % EX OINT: TOPICAL_OINTMENT | Freq: Every day | CUTANEOUS | Status: AC

## 2016-06-18 NOTE — Progress Notes (Signed)
GYN ONC FOLLOW UP  Assessment:    61 y.o. 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   S/p simple partial left vulvectomy on 08/29/14 and 01/22/16.  S/p wide local excision of left labia on 02/14/16 for positive margins pagets with positive margin at 3 o'clock.  Plan: Follow-up today's biopsy Recheck vulva in 3 months for Pagets surveillance. Try course of clobetasol x2 weeks for lichen (based on symptoms). Will try ointment preparation.   HPI:  Stephanie Sanchez is a 34 y.o. year old initially seen in consultation on 07/02/12 for extramammary pagets.  She then underwent a simple partial left vulvectomy on 99991111 without complications.  Her postoperative course was uncomplicated.  Her final pathology revealed extramammary pagets of the left lateral labia majora with inked bilateral margins and 12 o'clock margin. The biopsies from the pruritic vulva on the posterior vagina and perinal regions revealed benign hyperplasia consistent with lichen simplex chronicus.  When I saw the patient in December, 2016 she had noticed the development of a "cyst" in the left posterior labia majora in the past month. It has decreased in size from a "kidney bean" size to now a "pea size". Tender. Increases in size when she wears pants.   On 05/04/15 she went to the OR for an excision of the posterior left labia majora which revealed a benign epidermal inclusion cyst. Random biopsies from the left and right mid labia minora and perineum were negative for LS and pagets.  Biopsy performed on 8/24/17by Dr Helane Rima  of left labia majora showed extramammary pagets.  Last colonoscopy 11/16  Surgery was performed on 01/22/16 (left vulvectomy) and pagets was positive on the 9 o'clock margin.  The patient requested re-exicision and was taken back on 02/14/16, Pathology showed a focal positive margin at 3 o'clock. She developed postop cellulitis and separation of the  wound on week which resolved with antibiotics and expectant management.  Interval Hx: She has noticed new itch at anterior vulva (near clitoris).  Current Outpatient Prescriptions on File Prior to Visit  Medication Sig Dispense Refill  . Ascorbic Acid (VITAMIN C) 1000 MG tablet Take 1,000 mg by mouth daily.     . Cholecalciferol (VITAMIN D) 2000 UNITS tablet Take 2,000 Units by mouth daily.    Marland Kitchen estradiol (MINIVELLE) 0.05 MG/24HR patch twice a week. patch    . magnesium 30 MG tablet Take 30 mg by mouth daily.     . TURMERIC PO Take 1 capsule by mouth daily.     . Aspirin-Acetaminophen-Caffeine (EXCEDRIN PO) Take by mouth as needed. Reported on 05/28/2015     No current facility-administered medications on file prior to visit.    Allergies  Allergen Reactions  . Codeine Itching, Nausea And Vomiting and Rash    "comes out like a burn/rash"   Past Medical History:  Diagnosis Date  . Arthritis   . GERD (gastroesophageal reflux disease)   . History of cervical dysplasia   . History of colon polyps   . History of gastric ulcer    2013  . History of squamous cell carcinoma excision    left elbow  . Lichen simplex chronicus   . Paget's disease of vulva    first dx 2013  . PONV (postoperative nausea and vomiting)   . Psoriasis   . Scoliosis    Past Surgical History:  Procedure Laterality Date  . BLADDER SUSPENSION  2008   sling  .  BUNIONECTOMY  2008  . CARDIOVASCULAR STRESS TEST  09-30-2013   dr Marlou Porch   normal nuclear study/  no ischemia/  normal LV function and wall motion,  ef 61%  . CATARACT EXTRACTION W/ INTRAOCULAR LENS  IMPLANT, BILATERAL    . HAND SURGERY Right 11/02/12   hand reconstruction at [facility]  . TONSILLECTOMY  78  age 32  . VAGINAL HYSTERECTOMY  39  age 37  . VULVA Milagros Loll BIOPSY N/A 08/29/2014   Procedure: Georgena Spurling;  Surgeon: Everitt Amber, MD;  Location: [facility];  Service: Gynecology;  Laterality: N/A;  . VULVECTOMY N/A 08/29/2014    Procedure: WIDE LOCAL EXCISION OF VULVA;  Surgeon: Everitt Amber, MD;  Location: [place];  Service: Gynecology;  Laterality: N/A;  . VULVECTOMY  2013  . VULVECTOMY N/A 05/04/2015   Procedure: WIDE LOCAL EXCISION LEFT  VULVAR WITH BIOPSY OF RIGHT VULVA;  Surgeon: Everitt Amber, MD;  Location: [place];  Service: Gynecology;  Laterality: N/A;  . VULVECTOMY Left 01/22/2016   Procedure: PARTIAL SIMPLE VULVECTOMY;  Surgeon: Everitt Amber, MD;  Location: [facility];  Service: Gynecology;  Laterality: Left;  Marland Kitchen VULVECTOMY N/A 02/14/2016   Procedure: WIDE EXCISION VULVECTOMY;  Surgeon: Everitt Amber, MD;  Location: [facility];  Service: Gynecology;  Laterality: N/A;   Family History  Problem Relation Age of Onset  . Aortic aneurysm Mother   . AAA (abdominal aortic aneurysm) Mother   . Diabetes Father   . Other Father     enlarged heart  . Arrhythmia Brother   . Heart attack Maternal Grandfather   . Arrhythmia Brother   . Thyroid cancer Daughter 48   Social History   Social History  . Marital status: Divorced    Spouse name: N/A  . Number of children: N/A  . Years of education: N/A   Occupational History  . Not on file.   Social History Main Topics  . Smoking status: Former Smoker    Packs/day: 1.00    Years: 5.00    Types: Cigarettes    Quit date: 10/05/1978  . Smokeless tobacco: Never Used  . Alcohol use No     Comment: occas  . Drug use: No  . Sexual activity: Not on file   Other Topics Concern  . Not on file   Social History Narrative   ** Merged History Encounter **        Review of systems: Constitutional:  She has no weight gain or weight loss. She has no fever or chills. Eyes: No blurred vision Ears, Nose, Mouth, Throat: No dizziness, headaches or changes in hearing. No mouth sores. Cardiovascular: No chest pain, palpitations or edema. Respiratory:  No shortness of breath, wheezing or  cough Gastrointestinal: She has normal bowel movements without diarrhea or constipation. She denies any nausea or vomiting. She denies blood in her stool or heart burn. Genitourinary:  She denies pelvic pain, pelvic pressure or changes in her urinary function. She has no hematuria, dysuria, or incontinence. She has no irregular vaginal bleeding or vaginal discharge Musculoskeletal: Denies muscle weakness or joint pains.  Skin:  She has no skin changes, rashes or itching Neurological:  Denies dizziness or headaches. No neuropathy, no numbness or tingling. Psychiatric:  She denies depression or anxiety. Hematologic/Lymphatic:   No easy bruising or bleeding   Physical Exam: Blood pressure 131/90, pulse 77, temperature 98.1 F (36.7 C), temperature source Oral, resp. rate 18, height 5\' 3"  (  1.6 m), weight 120 lb (54.4 kg), SpO2 100 %. General: Well dressed, well nourished in no apparent distress.   HEENT:  Normocephalic and atraumatic, no lesions.  Extraocular muscles intact. Sclerae anicteric. Pupils equal, round, reactive. No mouth sores or ulcers. Thyroid is normal size, not nodular, midline. Skin:  No lesions or rashes. Breasts:  deferred Lungs:  deferred Cardiovascular:  deferred Abdomen:  deferred Genitourinary: Left vulvar no longer erythematous with no spreading on surrounding skin. No fluctuance or undrained fluid. No open wound or ulceration - completely healed. Very atrophic vulva. With application of acetic acid there is a 2cm patch of acetowhite thin skin on the posterior left introitus. Biopsy taken. Extremities: No cyanosis, clubbing or edema.  No calf tenderness or erythema. No palpable cords. Psychiatric: Mood and affect are appropriate. Neurological: Awake, alert and oriented x 3. Sensation is intact, no neuropathy.  Musculoskeletal: No pain, normal strength and range of motion.  PROCEDURE NOTE:  Vulvar biopsy  Patient provided verbal consent. Time out performed. Betadine  applied. 1cc of 2% lidocaine infiltrated into posterior introitus. 76mm punch use to sample tissue at center of lesion. Hemostasis achieved with silver nitrate. Specimen: left vulva to pathology. [id number] Complications: none.   [name], MD

## 2016-06-18 NOTE — Patient Instructions (Signed)
Clobetasol ointment: nightly x 2 weeks. Focus on skin at the front of the vulva.

## 2016-06-24 ENCOUNTER — Telehealth: Payer: Self-pay

## 2016-06-24 NOTE — Telephone Encounter (Signed)
  [name] Preferred Name:  Stephanie Sanchez Female, 61 y.o., 1956/03/19 Last Weight:  120 lb (54.4 kg) Weight:  120 lb (54.4 kg) Phone:  *[phone]; [phone]; [id number] PCP:  Crist Infante, MD Language:  English Need Interpreter:  None Allergies:  Codeine Health Maintenance Due?:  Health Maintenance Active FYIs:  None Primary Ins.:  BLUE Porfirio Mylar MRN:  [id number] MyChart:  Active Next Appt:  10/01/2016     Surgical pathology  Order: [id number]  Status:  Final result Visible to patient:  Yes (MyChart) Dx:  Paget's disease of vulva  Notes Recorded by Everitt Amber, MD on 06/23/2016 at 2:30 PM EST Dear Jovita Gamma, Can you let Kaarina know that no pagets was seen - just lichen sclerosis. When her biopsy site feels healed (about 2 weeks) she can go back to using the steroid ointment.  Emma    Specimen Collected: 06/18/16 00:00 Last Resulted: 06/23/16 00:00            Linked Documents   View Image        [facility] IM  (MR# [id number])

## 2016-06-24 NOTE — Telephone Encounter (Signed)
L/M for patient to call back to Dr. Denman George office for results at [phone].

## 2016-06-24 NOTE — Telephone Encounter (Signed)
Told Stephanie Sanchez the results of the pathology as noted below by Dr. Denman George. Pt verbalized understanding.

## 2016-07-07 ENCOUNTER — Other Ambulatory Visit: Payer: Self-pay

## 2016-07-28 ENCOUNTER — Telehealth: Payer: Self-pay

## 2016-07-28 NOTE — Telephone Encounter (Signed)
Ms Dace states that she his experiencing increased itching in the vulva area.  She was told to call for an appointment with Dr. Denman George if she experiences increased symptoms  To have Dr. Denman George evaluate the area with possible bx. She is scheduled to see Dr. Denman George on 08-06-16 at 1000. Arriving at 0945 to register. Pt will hold the steroid ointment until visit as she has beedn instructed in the passed by Dr. Denman George.

## 2016-08-05 NOTE — Progress Notes (Signed)
GYN ONC FOLLOW UP  Assessment:    61 y.o. 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   S/p simple partial left vulvectomy on 08/29/14 and 01/22/16.  S/p wide local excision of left labia on 02/14/16 for positive margins pagets with positive margin at 3 o'clock.  Stress induced lichen sclerosis. No evidence of recurrent pagets at this time.  Plan: Out of work until September, 30th 2018 to minimize stress induced lichen symptoms.  Recheck vulva in 2 months for Pagets surveillance. Try course of clobetasol x4 weeks for lichen (based on symptoms). Will try ointment preparation.   HPI:  Stephanie Sanchez is a 55 y.o. year old initially seen in consultation on 07/02/12 for extramammary pagets.  She then underwent a simple partial left vulvectomy on 9/60/45 without complications.  Her postoperative course was uncomplicated.  Her final pathology revealed extramammary pagets of the left lateral labia majora with inked bilateral margins and 12 o'clock margin. The biopsies from the pruritic vulva on the posterior vagina and perinal regions revealed benign hyperplasia consistent with lichen simplex chronicus.  When I saw the patient in December, 2016 she had noticed the development of a "cyst" in the left posterior labia majora in the past month. It has decreased in size from a "kidney bean" size to now a "pea size". Tender. Increases in size when she wears pants.   On 05/04/15 she went to the OR for an excision of the posterior left labia majora which revealed a benign epidermal inclusion cyst. Random biopsies from the left and right mid labia minora and perineum were negative for LS and pagets.  Biopsy performed on 8/24/17by Dr Helane Rima  of left labia majora showed extramammary pagets.  Last colonoscopy 11/16  Surgery was performed on 01/22/16 (left vulvectomy) and pagets was positive on the 9 o'clock margin.  The patient requested re-exicision  and was taken back on 02/14/16, Pathology showed a focal positive margin at 3 o'clock. She developed postop cellulitis and separation of the wound on week which resolved with antibiotics and expectant management.  Interval Hx: When seen in February, 2018 she reported a new itch at anterior vulva (near clitoris). There was an area on the posterior left vaginal introitus that was acetowhite and was biopsied showing no pagets or dysplasia, only lichen sclerosis.  She has not been using clobetasol regularly - only intermittently.  Has felt increased itch symptoms around clitoris and at left posterior introitus (site of previous biopsy).  Current Outpatient Prescriptions on File Prior to Visit  Medication Sig Dispense Refill  . Ascorbic Acid (VITAMIN C) 1000 MG tablet Take 1,000 mg by mouth daily.     . Aspirin-Acetaminophen-Caffeine (EXCEDRIN PO) Take by mouth as needed. Reported on 05/28/2015    . Cholecalciferol (VITAMIN D) 2000 UNITS tablet Take 2,000 Units by mouth daily.    . DOCOSAHEXAENOIC ACID PO Take 1 g by mouth.    . estradiol (MINIVELLE) 0.05 MG/24HR patch twice a week. patch    . magnesium 30 MG tablet Take 30 mg by mouth daily.     . TURMERIC PO Take 1 capsule by mouth daily.      No current facility-administered medications on file prior to visit.    Allergies  Allergen Reactions  . Codeine Itching, Nausea And Vomiting and Rash    "comes out like a burn/rash"   Past Medical History:  Diagnosis Date  . Arthritis   . GERD (gastroesophageal reflux  disease)   . History of cervical dysplasia   . History of colon polyps   . History of gastric ulcer    2013  . History of squamous cell carcinoma excision    left elbow  . Lichen simplex chronicus   . Paget's disease of vulva    first dx 2013  . PONV (postoperative nausea and vomiting)   . Psoriasis   . Scoliosis    Past Surgical History:  Procedure Laterality Date  . BLADDER SUSPENSION  2008   sling  . BUNIONECTOMY   2008  . CARDIOVASCULAR STRESS TEST  09-30-2013   dr Marlou Porch   normal nuclear study/  no ischemia/  normal LV function and wall motion,  ef 61%  . CATARACT EXTRACTION W/ INTRAOCULAR LENS  IMPLANT, BILATERAL    . HAND SURGERY Right 11/02/12   hand reconstruction at [facility]  . TONSILLECTOMY  41  age 13  . VAGINAL HYSTERECTOMY  12  age 37  . VULVA Milagros Loll BIOPSY N/A 08/29/2014   Procedure: Georgena Spurling;  Surgeon: Everitt Amber, MD;  Location: [facility];  Service: Gynecology;  Laterality: N/A;  . VULVECTOMY N/A 08/29/2014   Procedure: WIDE LOCAL EXCISION OF VULVA;  Surgeon: Everitt Amber, MD;  Location: [place];  Service: Gynecology;  Laterality: N/A;  . VULVECTOMY  2013  . VULVECTOMY N/A 05/04/2015   Procedure: WIDE LOCAL EXCISION LEFT  VULVAR WITH BIOPSY OF RIGHT VULVA;  Surgeon: Everitt Amber, MD;  Location: [place];  Service: Gynecology;  Laterality: N/A;  . VULVECTOMY Left 01/22/2016   Procedure: PARTIAL SIMPLE VULVECTOMY;  Surgeon: Everitt Amber, MD;  Location: [facility];  Service: Gynecology;  Laterality: Left;  Marland Kitchen VULVECTOMY N/A 02/14/2016   Procedure: WIDE EXCISION VULVECTOMY;  Surgeon: Everitt Amber, MD;  Location: [facility];  Service: Gynecology;  Laterality: N/A;   Family History  Problem Relation Age of Onset  . Aortic aneurysm Mother   . AAA (abdominal aortic aneurysm) Mother   . Diabetes Father   . Other Father     enlarged heart  . Arrhythmia Brother   . Heart attack Maternal Grandfather   . Arrhythmia Brother   . Thyroid cancer Daughter 41   Social History   Social History  . Marital status: Divorced    Spouse name: N/A  . Number of children: N/A  . Years of education: N/A   Occupational History  . Not on file.   Social History Main Topics  . Smoking status: Former Smoker    Packs/day: 1.00    Years: 5.00    Types: Cigarettes    Quit date: 10/05/1978  . Smokeless tobacco: Never Used   . Alcohol use No     Comment: occas  . Drug use: No  . Sexual activity: Not on file   Other Topics Concern  . Not on file   Social History Narrative   ** Merged History Encounter **        Review of systems: Constitutional:  She has no weight gain or weight loss. She has no fever or chills. Eyes: No blurred vision Ears, Nose, Mouth, Throat: No dizziness, headaches or changes in hearing. No mouth sores. Cardiovascular: No chest pain, palpitations or edema. Respiratory:  No shortness of breath, wheezing or cough Gastrointestinal: She has normal bowel movements without diarrhea or constipation. She denies any nausea or vomiting. She denies blood in her stool or heart burn. Genitourinary:  She denies pelvic  pain, pelvic pressure or changes in her urinary function. She has no hematuria, dysuria, or incontinence. She has no irregular vaginal bleeding or vaginal discharge Musculoskeletal: Denies muscle weakness or joint pains.  Skin:  She has no skin changes, rashes or itching Neurological:  Denies dizziness or headaches. No neuropathy, no numbness or tingling. Psychiatric:  She denies depression or anxiety. Hematologic/Lymphatic:   No easy bruising or bleeding   Physical Exam: Blood pressure (!) 151/78, pulse 87, temperature 98.2 F (36.8 C), resp. rate 18, weight 118 lb 9.6 oz (53.8 kg). General: Well dressed, well nourished in no apparent distress.   HEENT:  Normocephalic and atraumatic, no lesions.  Extraocular muscles intact. Sclerae anicteric. Pupils equal, round, reactive. No mouth sores or ulcers. Thyroid is normal size, not nodular, midline. Skin:  No lesions or rashes. Breasts:  deferred Lungs:  deferred Cardiovascular:  deferred Abdomen:  deferred Genitourinary: Left vulvar no longer erythematous with no spreading on surrounding skin. No fluctuance or undrained fluid. No open wound or ulceration - completely healed. Very atrophic vulva. With application of acetic acid  there is a 2cm patch of acetowhite thin skin on the posterior left introitus.  Extremities: No cyanosis, clubbing or edema.  No calf tenderness or erythema. No palpable cords. Psychiatric: Mood and affect are appropriate. Neurological: Awake, alert and oriented x 3. Sensation is intact, no neuropathy.  Musculoskeletal: No pain, normal strength and range of motion.    [name], MD

## 2016-08-06 ENCOUNTER — Encounter: Payer: Self-pay | Admitting: Gynecologic Oncology

## 2016-08-06 ENCOUNTER — Ambulatory Visit: Payer: BC Managed Care – PPO | Attending: Gynecologic Oncology | Admitting: Gynecologic Oncology

## 2016-08-06 VITALS — BP 151/78 | HR 87 | Temp 98.2°F | Resp 18 | Wt 118.6 lb

## 2016-08-06 DIAGNOSIS — L280 Lichen simplex chronicus: Secondary | ICD-10-CM | POA: Insufficient documentation

## 2016-08-06 DIAGNOSIS — Z79899 Other long term (current) drug therapy: Secondary | ICD-10-CM | POA: Insufficient documentation

## 2016-08-06 DIAGNOSIS — Z7982 Long term (current) use of aspirin: Secondary | ICD-10-CM | POA: Insufficient documentation

## 2016-08-06 DIAGNOSIS — Z87891 Personal history of nicotine dependence: Secondary | ICD-10-CM | POA: Diagnosis not present

## 2016-08-06 MED ORDER — CLOBETASOL PROPIONATE 0.05 % EX OINT: TOPICAL_OINTMENT | Freq: Two times a day (BID) | CUTANEOUS | Status: DC

## 2016-08-06 NOTE — Patient Instructions (Signed)
Dr. Denman George sent in prescription for the clobetasol cream to use twice a day for 4 weeks. Keep appointment as scheduled on 10-01-16 at 1:15pm. Call before this appointment if any questions or concerns. A note for woork was provide to you today.

## 2016-08-15 ENCOUNTER — Other Ambulatory Visit: Payer: Self-pay

## 2016-08-15 DIAGNOSIS — L280 Lichen simplex chronicus: Secondary | ICD-10-CM

## 2016-08-15 MED ORDER — CLOBETASOL PROPIONATE 0.05 % EX OINT
1.0000 | TOPICAL_OINTMENT | Freq: Two times a day (BID) | CUTANEOUS | 1 refills | Status: DC
Start: 2016-08-15 — End: 2017-09-11

## 2016-08-15 NOTE — Progress Notes (Unsigned)
Notified Stephanie Sanchez that the clobetasol ointment was sent in to her pharmacy.

## 2016-08-15 NOTE — Addendum Note (Signed)
Addended by: Baruch Merl on: 08/15/2016 10:17 AM   Modules accepted: Orders

## 2016-08-21 ENCOUNTER — Telehealth: Payer: Self-pay | Admitting: *Deleted

## 2016-08-21 NOTE — Telephone Encounter (Signed)
Per patient voicemail from yesterday I have called her back regarding her paperwork. I have told the patient that her paperwork is at the front desk for her and ready.

## 2016-08-26 ENCOUNTER — Telehealth: Payer: Self-pay | Admitting: Gynecologic Oncology

## 2016-08-26 NOTE — Telephone Encounter (Signed)
Faxed completed FMLA paperwork to [employer] fax#[phone] on 08/19/16 and scanned copy in Epic

## 2016-10-01 ENCOUNTER — Ambulatory Visit: Payer: BC Managed Care – PPO | Attending: Gynecologic Oncology | Admitting: Gynecologic Oncology

## 2016-10-01 ENCOUNTER — Encounter: Payer: Self-pay | Admitting: Gynecologic Oncology

## 2016-10-01 VITALS — BP 140/84 | HR 86 | Temp 98.3°F | Resp 18 | Wt 120.2 lb

## 2016-10-01 DIAGNOSIS — Z86010 Personal history of colonic polyps: Secondary | ICD-10-CM | POA: Diagnosis not present

## 2016-10-01 DIAGNOSIS — Z87891 Personal history of nicotine dependence: Secondary | ICD-10-CM | POA: Diagnosis not present

## 2016-10-01 DIAGNOSIS — Z7982 Long term (current) use of aspirin: Secondary | ICD-10-CM | POA: Diagnosis not present

## 2016-10-01 DIAGNOSIS — K219 Gastro-esophageal reflux disease without esophagitis: Secondary | ICD-10-CM | POA: Insufficient documentation

## 2016-10-01 DIAGNOSIS — N904 Leukoplakia of vulva: Secondary | ICD-10-CM

## 2016-10-01 DIAGNOSIS — Z808 Family history of malignant neoplasm of other organs or systems: Secondary | ICD-10-CM | POA: Insufficient documentation

## 2016-10-01 DIAGNOSIS — Z833 Family history of diabetes mellitus: Secondary | ICD-10-CM | POA: Diagnosis not present

## 2016-10-01 DIAGNOSIS — Z79899 Other long term (current) drug therapy: Secondary | ICD-10-CM | POA: Diagnosis not present

## 2016-10-01 DIAGNOSIS — Z8544 Personal history of malignant neoplasm of other female genital organs: Secondary | ICD-10-CM | POA: Diagnosis not present

## 2016-10-01 DIAGNOSIS — Z9889 Other specified postprocedural states: Secondary | ICD-10-CM | POA: Diagnosis not present

## 2016-10-01 DIAGNOSIS — C4499 Other specified malignant neoplasm of skin, unspecified: Secondary | ICD-10-CM

## 2016-10-01 DIAGNOSIS — C519 Malignant neoplasm of vulva, unspecified: Secondary | ICD-10-CM

## 2016-10-01 DIAGNOSIS — Z8719 Personal history of other diseases of the digestive system: Secondary | ICD-10-CM | POA: Diagnosis not present

## 2016-10-01 DIAGNOSIS — L280 Lichen simplex chronicus: Secondary | ICD-10-CM | POA: Insufficient documentation

## 2016-10-01 NOTE — Patient Instructions (Signed)
Please notify Dr Denman George if you develop new itch symptoms that are unrelieved by a week of clobetasol therapy.  If you remain symptom free, please return to see Dr Denman George in November, 2018 as scheduled.

## 2016-10-01 NOTE — Progress Notes (Signed)
GYN ONC FOLLOW UP  Assessment:    61 y.o. 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   S/p simple partial left vulvectomy on 08/29/14 and 01/22/16.  S/p wide local excision of left labia on 02/14/16 for positive margins pagets with positive margin at 3 o'clock.  Stress induced lichen sclerosis. No evidence of recurrent pagets at this time.  Plan: Out of work until September, 30th 2018 to minimize stress induced lichen symptoms.  Recheck vulva in 6 months for Pagets surveillance.   HPI:  Stephanie Sanchez is a 80 y.o. year old initially seen in consultation on 07/02/12 for extramammary pagets.  She then underwent a simple partial left vulvectomy on 8/78/67 without complications.  Her postoperative course was uncomplicated.  Her final pathology revealed extramammary pagets of the left lateral labia majora with inked bilateral margins and 12 o'clock margin. The biopsies from the pruritic vulva on the posterior vagina and perinal regions revealed benign hyperplasia consistent with lichen simplex chronicus.  When I saw the patient in December, 2016 she had noticed the development of a "cyst" in the left posterior labia majora in the past month. It has decreased in size from a "kidney bean" size to now a "pea size". Tender. Increases in size when she wears pants.   On 05/04/15 she went to the OR for an excision of the posterior left labia majora which revealed a benign epidermal inclusion cyst. Random biopsies from the left and right mid labia minora and perineum were negative for LS and pagets.  Biopsy performed on 8/24/17by Dr Helane Rima  of left labia majora showed extramammary pagets.  Last colonoscopy 11/16  Surgery was performed on 01/22/16 (left vulvectomy) and pagets was positive on the 9 o'clock margin.  The patient requested re-exicision and was taken back on 02/14/16, Pathology showed a focal positive margin at 3 o'clock. She  developed postop cellulitis and separation of the wound on week which resolved with antibiotics and expectant management.  When seen in February, 2018 she reported a new itch at anterior vulva (near clitoris). There was an area on the posterior left vaginal introitus that was acetowhite and was biopsied showing no pagets or dysplasia, only lichen sclerosis.  Interval Hx: She used clobetasol for four weeks in early 2018 and noted resolution of symptoms.  Current Outpatient Prescriptions on File Prior to Visit  Medication Sig Dispense Refill  . Ascorbic Acid (VITAMIN C) 1000 MG tablet Take 1,000 mg by mouth daily.     . Aspirin-Acetaminophen-Caffeine (EXCEDRIN PO) Take by mouth as needed. Reported on 05/28/2015    . Cholecalciferol (VITAMIN D) 2000 UNITS tablet Take 2,000 Units by mouth daily.    . DOCOSAHEXAENOIC ACID PO Take 1 g by mouth.    . estradiol (MINIVELLE) 0.05 MG/24HR patch twice a week. patch    . magnesium 30 MG tablet Take 30 mg by mouth daily.     Marland Kitchen OVER THE COUNTER MEDICATION Wheat grass daily    . TURMERIC PO Take 1 capsule by mouth daily.     . clobetasol ointment (TEMOVATE) 6.72 % Apply 1 application topically 2 (two) times daily. (Patient not taking: Reported on 10/01/2016) 30 g 1   No current facility-administered medications on file prior to visit.    Allergies  Allergen Reactions  . Codeine Itching, Nausea And Vomiting and Rash    "comes out like a burn/rash"   Past Medical History:  Diagnosis Date  .  Arthritis   . GERD (gastroesophageal reflux disease)   . History of cervical dysplasia   . History of colon polyps   . History of gastric ulcer    2013  . History of squamous cell carcinoma excision    left elbow  . Lichen simplex chronicus   . Paget's disease of vulva    first dx 2013  . PONV (postoperative nausea and vomiting)   . Psoriasis   . Scoliosis    Past Surgical History:  Procedure Laterality Date  . BLADDER SUSPENSION  2008   sling  .  BUNIONECTOMY  2008  . CARDIOVASCULAR STRESS TEST  09-30-2013   dr Marlou Porch   normal nuclear study/  no ischemia/  normal LV function and wall motion,  ef 61%  . CATARACT EXTRACTION W/ INTRAOCULAR LENS  IMPLANT, BILATERAL    . HAND SURGERY Right 11/02/12   hand reconstruction at [facility]  . TONSILLECTOMY  47  age 49  . VAGINAL HYSTERECTOMY  46  age 11  . VULVA Milagros Loll BIOPSY N/A 08/29/2014   Procedure: Georgena Spurling;  Surgeon: Everitt Amber, MD;  Location: [facility];  Service: Gynecology;  Laterality: N/A;  . VULVECTOMY N/A 08/29/2014   Procedure: WIDE LOCAL EXCISION OF VULVA;  Surgeon: Everitt Amber, MD;  Location: [place];  Service: Gynecology;  Laterality: N/A;  . VULVECTOMY  2013  . VULVECTOMY N/A 05/04/2015   Procedure: WIDE LOCAL EXCISION LEFT  VULVAR WITH BIOPSY OF RIGHT VULVA;  Surgeon: Everitt Amber, MD;  Location: [place];  Service: Gynecology;  Laterality: N/A;  . VULVECTOMY Left 01/22/2016   Procedure: PARTIAL SIMPLE VULVECTOMY;  Surgeon: Everitt Amber, MD;  Location: [facility];  Service: Gynecology;  Laterality: Left;  Marland Kitchen VULVECTOMY N/A 02/14/2016   Procedure: WIDE EXCISION VULVECTOMY;  Surgeon: Everitt Amber, MD;  Location: [facility];  Service: Gynecology;  Laterality: N/A;   Family History  Problem Relation Age of Onset  . Aortic aneurysm Mother   . AAA (abdominal aortic aneurysm) Mother   . Diabetes Father   . Other Father        enlarged heart  . Arrhythmia Brother   . Heart attack Maternal Grandfather   . Arrhythmia Brother   . Thyroid cancer Daughter 53   Social History   Social History  . Marital status: Divorced    Spouse name: N/A  . Number of children: N/A  . Years of education: N/A   Occupational History  . Not on file.   Social History Main Topics  . Smoking status: Former Smoker    Packs/day: 1.00    Years: 5.00    Types: Cigarettes    Quit date: 10/05/1978  . Smokeless  tobacco: Never Used  . Alcohol use No     Comment: occas  . Drug use: No  . Sexual activity: Not on file   Other Topics Concern  . Not on file   Social History Narrative   ** Merged History Encounter **        Review of systems: Constitutional:  She has no weight gain or weight loss. She has no fever or chills. Eyes: No blurred vision Ears, Nose, Mouth, Throat: No dizziness, headaches or changes in hearing. No mouth sores. Cardiovascular: No chest pain, palpitations or edema. Respiratory:  No shortness of breath, wheezing or cough Gastrointestinal: She has normal bowel movements without diarrhea or constipation. She denies any nausea or vomiting. She denies blood in  her stool or heart burn. Genitourinary:  She denies pelvic pain, pelvic pressure or changes in her urinary function. She has no hematuria, dysuria, or incontinence. She has no irregular vaginal bleeding or vaginal discharge Musculoskeletal: Denies muscle weakness or joint pains.  Skin:  She has no skin changes, rashes or itching Neurological:  Denies dizziness or headaches. No neuropathy, no numbness or tingling. Psychiatric:  She denies depression or anxiety. Hematologic/Lymphatic:   No easy bruising or bleeding   Physical Exam: Blood pressure 140/84, pulse 86, temperature 98.3 F (36.8 C), temperature source Oral, resp. rate 18, weight 120 lb 3.2 oz (54.5 kg). General: Well dressed, well nourished in no apparent distress.   HEENT:  Normocephalic and atraumatic, no lesions.  Extraocular muscles intact. Sclerae anicteric. Pupils equal, round, reactive. No mouth sores or ulcers. Thyroid is normal size, not nodular, midline. Skin:  No lesions or rashes. Breasts:  deferred Lungs:  deferred Cardiovascular:  deferred Abdomen:  deferred Genitourinary: Left vulvar no longer erythematous with no spreading on surrounding skin. No fluctuance or undrained fluid. No open wound or ulceration - completely healed. Very atrophic  vulva. With application of acetic acid there no erythematous or acetowhite skin changes.  Extremities: No cyanosis, clubbing or edema.  No calf tenderness or erythema. No palpable cords. Psychiatric: Mood and affect are appropriate. Neurological: Awake, alert and oriented x 3. Sensation is intact, no neuropathy.  Musculoskeletal: No pain, normal strength and range of motion.  [name], MD

## 2017-01-07 ENCOUNTER — Telehealth: Payer: Self-pay | Admitting: *Deleted

## 2017-01-07 NOTE — Telephone Encounter (Signed)
Attempted to contact the patient to schedule an appt. Left message to call the office back

## 2017-01-08 ENCOUNTER — Telehealth: Payer: Self-pay | Admitting: *Deleted

## 2017-01-08 NOTE — Telephone Encounter (Signed)
Returned the patients call and scheduled a follow up appt. Appt made for October 9th at 1:30pm.

## 2017-02-03 ENCOUNTER — Telehealth: Payer: Self-pay | Admitting: *Deleted

## 2017-02-03 NOTE — Telephone Encounter (Signed)
Returned patient's call and canceled October 9th appt. Patient will keep the November 26th appt

## 2017-02-10 ENCOUNTER — Ambulatory Visit: Payer: Self-pay | Admitting: Gynecologic Oncology

## 2017-02-23 ENCOUNTER — Telehealth: Payer: Self-pay | Admitting: *Deleted

## 2017-02-23 NOTE — Telephone Encounter (Signed)
Patient called and let a voicemail "needing to know when her appt was with Dr. Denman George in November." Attempted to contact the patient regarding her appt in November, [place] that her appt is November 11/26 at 2pm.

## 2017-03-30 ENCOUNTER — Encounter: Payer: Self-pay | Admitting: Gynecologic Oncology

## 2017-03-30 ENCOUNTER — Ambulatory Visit: Payer: BC Managed Care – PPO | Attending: Gynecologic Oncology | Admitting: Gynecologic Oncology

## 2017-03-30 VITALS — BP 137/69 | HR 82 | Temp 98.0°F | Resp 18 | Ht 63.0 in | Wt 119.7 lb

## 2017-03-30 DIAGNOSIS — M419 Scoliosis, unspecified: Secondary | ICD-10-CM | POA: Insufficient documentation

## 2017-03-30 DIAGNOSIS — Z7982 Long term (current) use of aspirin: Secondary | ICD-10-CM | POA: Insufficient documentation

## 2017-03-30 DIAGNOSIS — Z8719 Personal history of other diseases of the digestive system: Secondary | ICD-10-CM | POA: Insufficient documentation

## 2017-03-30 DIAGNOSIS — Z833 Family history of diabetes mellitus: Secondary | ICD-10-CM | POA: Diagnosis not present

## 2017-03-30 DIAGNOSIS — Z9889 Other specified postprocedural states: Secondary | ICD-10-CM | POA: Diagnosis not present

## 2017-03-30 DIAGNOSIS — Z79899 Other long term (current) drug therapy: Secondary | ICD-10-CM | POA: Insufficient documentation

## 2017-03-30 DIAGNOSIS — K219 Gastro-esophageal reflux disease without esophagitis: Secondary | ICD-10-CM | POA: Insufficient documentation

## 2017-03-30 DIAGNOSIS — L280 Lichen simplex chronicus: Secondary | ICD-10-CM | POA: Diagnosis not present

## 2017-03-30 DIAGNOSIS — L409 Psoriasis, unspecified: Secondary | ICD-10-CM | POA: Insufficient documentation

## 2017-03-30 DIAGNOSIS — C4499 Other specified malignant neoplasm of skin, unspecified: Secondary | ICD-10-CM

## 2017-03-30 DIAGNOSIS — Z87891 Personal history of nicotine dependence: Secondary | ICD-10-CM | POA: Insufficient documentation

## 2017-03-30 DIAGNOSIS — Z8544 Personal history of malignant neoplasm of other female genital organs: Secondary | ICD-10-CM | POA: Diagnosis not present

## 2017-03-30 DIAGNOSIS — Z9841 Cataract extraction status, right eye: Secondary | ICD-10-CM | POA: Insufficient documentation

## 2017-03-30 DIAGNOSIS — Z885 Allergy status to narcotic agent status: Secondary | ICD-10-CM | POA: Insufficient documentation

## 2017-03-30 DIAGNOSIS — C519 Malignant neoplasm of vulva, unspecified: Secondary | ICD-10-CM

## 2017-03-30 DIAGNOSIS — Z86010 Personal history of colonic polyps: Secondary | ICD-10-CM | POA: Insufficient documentation

## 2017-03-30 DIAGNOSIS — Z808 Family history of malignant neoplasm of other organs or systems: Secondary | ICD-10-CM | POA: Insufficient documentation

## 2017-03-30 DIAGNOSIS — Z8249 Family history of ischemic heart disease and other diseases of the circulatory system: Secondary | ICD-10-CM | POA: Diagnosis not present

## 2017-03-30 NOTE — Patient Instructions (Signed)
Plan on following up in May 2019 or sooner if needed.  Please call for any new symptoms, concerns, or questions.

## 2017-03-30 NOTE — Progress Notes (Signed)
GYN ONC FOLLOW UP  Assessment:    61 y.o. 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   S/p simple partial left vulvectomy on 08/29/14 and 01/22/16.  S/p wide local excision of left labia on 02/14/16 for positive margins pagets with positive margin at 3 o'clock.  No evidence of recurrent pagets at this time.  Plan: Out of work until September, 30th 2018 to minimize stress induced lichen symptoms.  Recheck vulva in 6 months for Pagets surveillance.   HPI:  Stephanie Sanchez is a 70 y.o. year old initially seen in consultation on 07/02/12 for extramammary pagets.  She then underwent a simple partial left vulvectomy on 4/65/68 without complications.  Her postoperative course was uncomplicated.  Her final pathology revealed extramammary pagets of the left lateral labia majora with inked bilateral margins and 12 o'clock margin. The biopsies from the pruritic vulva on the posterior vagina and perinal regions revealed benign hyperplasia consistent with lichen simplex chronicus.  When I saw the patient in December, 2016 she had noticed the development of a "cyst" in the left posterior labia majora in the past month. It has decreased in size from a "kidney bean" size to now a "pea size". Tender. Increases in size when she wears pants.   On 05/04/15 she went to the OR for an excision of the posterior left labia majora which revealed a benign epidermal inclusion cyst. Random biopsies from the left and right mid labia minora and perineum were negative for LS and pagets.  Biopsy performed on 8/24/17by Dr Helane Rima  of left labia majora showed extramammary pagets.  Last colonoscopy 11/16  Surgery was performed on 01/22/16 (left vulvectomy) and pagets was positive on the 9 o'clock margin.  The patient requested re-exicision and was taken back on 02/14/16, Pathology showed a focal positive margin at 3 o'clock. She developed postop cellulitis and  separation of the wound on week which resolved with antibiotics and expectant management.  When seen in February, 2018 she reported a new itch at anterior vulva (near clitoris). There was an area on the posterior left vaginal introitus that was acetowhite and was biopsied showing no pagets or dysplasia, only lichen sclerosis.  Interval Hx: She used clobetasol for four weeks in early 2018 and noted resolution of symptoms. She has had no complaints since that time.  Current Outpatient Medications on File Prior to Visit  Medication Sig Dispense Refill  . Ascorbic Acid (VITAMIN C) 1000 MG tablet Take 1,000 mg by mouth daily.     . Aspirin-Acetaminophen-Caffeine (EXCEDRIN PO) Take by mouth as needed. Reported on 05/28/2015    . Cholecalciferol (VITAMIN D) 2000 UNITS tablet Take 2,000 Units by mouth daily.    . clobetasol ointment (TEMOVATE) 1.27 % Apply 1 application topically 2 (two) times daily. 30 g 1  . DOCOSAHEXAENOIC ACID PO Take 1 g by mouth.    . estradiol (MINIVELLE) 0.05 MG/24HR patch twice a week. patch    . magnesium 30 MG tablet Take 30 mg by mouth daily.     Marland Kitchen OVER THE COUNTER MEDICATION Wheat grass daily    . TURMERIC PO Take 1 capsule by mouth daily.      No current facility-administered medications on file prior to visit.    Allergies  Allergen Reactions  . Codeine Itching, Nausea And Vomiting and Rash    "comes out like a burn/rash"   Past Medical History:  Diagnosis Date  . Arthritis   .  GERD (gastroesophageal reflux disease)   . History of cervical dysplasia   . History of colon polyps   . History of gastric ulcer    2013  . History of squamous cell carcinoma excision    left elbow  . Lichen simplex chronicus   . Paget's disease of vulva    first dx 2013  . PONV (postoperative nausea and vomiting)   . Psoriasis   . Scoliosis    Past Surgical History:  Procedure Laterality Date  . BLADDER SUSPENSION  2008   sling  . BUNIONECTOMY  2008  . CARDIOVASCULAR  STRESS TEST  09-30-2013   dr Marlou Porch   normal nuclear study/  no ischemia/  normal LV function and wall motion,  ef 61%  . CATARACT EXTRACTION W/ INTRAOCULAR LENS  IMPLANT, BILATERAL    . HAND SURGERY Right 11/02/12   hand reconstruction at [facility]  . TONSILLECTOMY  26  age 8  . VAGINAL HYSTERECTOMY  14  age 76  . VULVA Milagros Loll BIOPSY N/A 08/29/2014   Procedure: Georgena Spurling;  Surgeon: Everitt Amber, MD;  Location: [facility];  Service: Gynecology;  Laterality: N/A;  . VULVECTOMY N/A 08/29/2014   Procedure: WIDE LOCAL EXCISION OF VULVA;  Surgeon: Everitt Amber, MD;  Location: [place];  Service: Gynecology;  Laterality: N/A;  . VULVECTOMY  2013  . VULVECTOMY N/A 05/04/2015   Procedure: WIDE LOCAL EXCISION LEFT  VULVAR WITH BIOPSY OF RIGHT VULVA;  Surgeon: Everitt Amber, MD;  Location: [place];  Service: Gynecology;  Laterality: N/A;  . VULVECTOMY Left 01/22/2016   Procedure: PARTIAL SIMPLE VULVECTOMY;  Surgeon: Everitt Amber, MD;  Location: [facility];  Service: Gynecology;  Laterality: Left;  Marland Kitchen VULVECTOMY N/A 02/14/2016   Procedure: WIDE EXCISION VULVECTOMY;  Surgeon: Everitt Amber, MD;  Location: [facility];  Service: Gynecology;  Laterality: N/A;   Family History  Problem Relation Age of Onset  . Aortic aneurysm Mother   . AAA (abdominal aortic aneurysm) Mother   . Diabetes Father   . Other Father        enlarged heart  . Arrhythmia Brother   . Heart attack Maternal Grandfather   . Arrhythmia Brother   . Thyroid cancer Daughter 60   Social History   Socioeconomic History  . Marital status: Divorced    Spouse name: Not on file  . Number of children: Not on file  . Years of education: Not on file  . Highest education level: Not on file  Social Needs  . Financial resource strain: Not on file  . Food insecurity - worry: Not on file  . Food insecurity - inability: Not on file  . Transportation needs -  medical: Not on file  . Transportation needs - non-medical: Not on file  Occupational History  . Not on file  Tobacco Use  . Smoking status: Former Smoker    Packs/day: 1.00    Years: 5.00    Pack years: 5.00    Types: Cigarettes    Last attempt to quit: 10/05/1978    Years since quitting: 38.5  . Smokeless tobacco: Never Used  Substance and Sexual Activity  . Alcohol use: No    Alcohol/week: 0.6 - 1.2 oz    Types: 1 - 2 Glasses of wine per week    Comment: occas  . Drug use: No  . Sexual activity: Not on file  Other Topics Concern  . Not on file  Social History Narrative   ** Merged History Encounter **        Review of systems: Constitutional:  She has no weight gain or weight loss. She has no fever or chills. Eyes: No blurred vision Ears, Nose, Mouth, Throat: No dizziness, headaches or changes in hearing. No mouth sores. Cardiovascular: No chest pain, palpitations or edema. Respiratory:  No shortness of breath, wheezing or cough Gastrointestinal: She has normal bowel movements without diarrhea or constipation. She denies any nausea or vomiting. She denies blood in her stool or heart burn. Genitourinary:  She denies pelvic pain, pelvic pressure or changes in her urinary function. She has no hematuria, dysuria, or incontinence. She has no irregular vaginal bleeding or vaginal discharge Musculoskeletal: Denies muscle weakness or joint pains.  Skin:  She has no skin changes, rashes or itching Neurological:  Denies dizziness or headaches. No neuropathy, no numbness or tingling. Psychiatric:  She denies depression or anxiety. Hematologic/Lymphatic:   No easy bruising or bleeding   Physical Exam: Blood pressure 137/69, pulse 82, temperature 98 F (36.7 C), temperature source Oral, resp. rate 18, height 5\' 3"  (1.6 m), weight 119 lb 11.2 oz (54.3 kg), SpO2 97 %. General: Well dressed, well nourished in no apparent distress.   HEENT:  Normocephalic and atraumatic, no lesions.   Extraocular muscles intact. Sclerae anicteric. Pupils equal, round, reactive. No mouth sores or ulcers. Thyroid is normal size, not nodular, midline. Skin:  No lesions or rashes. Breasts:  deferred Lungs:  deferred Cardiovascular:  deferred Abdomen:  deferred Genitourinary: Left vulvar no longer erythematous with no spreading on surrounding skin. No fluctuance or undrained fluid. No open wound or ulceration - completely healed. Very atrophic vulva. With application of acetic acid there no erythematous or acetowhite skin changes.  Extremities: No cyanosis, clubbing or edema.  No calf tenderness or erythema. No palpable cords. Psychiatric: Mood and affect are appropriate. Neurological: Awake, alert and oriented x 3. Sensation is intact, no neuropathy.  Musculoskeletal: No pain, normal strength and range of motion.  [name], MD

## 2017-09-02 ENCOUNTER — Telehealth: Payer: Self-pay | Admitting: *Deleted

## 2017-09-02 NOTE — Telephone Encounter (Signed)
Returned the patient's call and gave her the appt for May 15th at 2:30pm

## 2017-09-07 ENCOUNTER — Telehealth: Payer: Self-pay | Admitting: *Deleted

## 2017-09-07 NOTE — Telephone Encounter (Signed)
Patient called back and we moved her appt from May 15th to May 10th

## 2017-09-07 NOTE — Telephone Encounter (Signed)
Called and left the patient a message to call the office back. Need to move her appt from May 15th to this week.

## 2017-09-11 ENCOUNTER — Inpatient Hospital Stay: Payer: BC Managed Care – PPO | Attending: Gynecologic Oncology | Admitting: Gynecologic Oncology

## 2017-09-11 ENCOUNTER — Encounter: Payer: Self-pay | Admitting: Gynecologic Oncology

## 2017-09-11 VITALS — BP 124/53 | HR 86 | Temp 98.5°F | Resp 20 | Ht 63.0 in | Wt 122.4 lb

## 2017-09-11 DIAGNOSIS — Z8544 Personal history of malignant neoplasm of other female genital organs: Secondary | ICD-10-CM

## 2017-09-11 DIAGNOSIS — N949 Unspecified condition associated with female genital organs and menstrual cycle: Secondary | ICD-10-CM

## 2017-09-11 DIAGNOSIS — C4499 Other specified malignant neoplasm of skin, unspecified: Secondary | ICD-10-CM

## 2017-09-11 DIAGNOSIS — C519 Malignant neoplasm of vulva, unspecified: Secondary | ICD-10-CM

## 2017-09-11 DIAGNOSIS — N952 Postmenopausal atrophic vaginitis: Secondary | ICD-10-CM | POA: Insufficient documentation

## 2017-09-11 MED ORDER — ESTROGENS, CONJUGATED 0.625 MG/GM VA CREA: 1.0000 | TOPICAL_CREAM | VAGINAL | 12 refills | Status: DC

## 2017-09-11 NOTE — Patient Instructions (Signed)
Use premarin cream at night every 6 months. Apply to skin on outside of vagina and an applicator full internally.  Please return to see Dr Denman George in 6 months for vulvar inspection or sooner if symptoms worsen.

## 2017-09-11 NOTE — Progress Notes (Signed)
GYN ONC FOLLOW UP  Assessment:    62 y.o. 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   S/p simple partial left vulvectomy on 08/29/14 and 01/22/16.  S/p wide local excision of left labia on 02/14/16 for positive margins pagets with positive margin at 3 o'clock.  No evidence of recurrent pagets at this time.  Plan: Premarin three times a week to vagina to treat vulvovaginal atrophy. Recheck vulva in 6 months for Pagets surveillance.   HPI:  Stephanie Sanchez is a 41 y.o. year old initially seen in consultation on 07/02/12 for extramammary pagets.  She then underwent a simple partial left vulvectomy on 9/47/65 without complications.  Her postoperative course was uncomplicated.  Her final pathology revealed extramammary pagets of the left lateral labia majora with inked bilateral margins and 12 o'clock margin. The biopsies from the pruritic vulva on the posterior vagina and perinal regions revealed benign hyperplasia consistent with lichen simplex chronicus.  When I saw the patient in December, 2016 she had noticed the development of a "cyst" in the left posterior labia majora in the past month. It has decreased in size from a "kidney bean" size to now a "pea size". Tender. Increases in size when she wears pants.   On 05/04/15 she went to the OR for an excision of the posterior left labia majora which revealed a benign epidermal inclusion cyst. Random biopsies from the left and right mid labia minora and perineum were negative for LS and pagets.  Biopsy performed on 8/24/17by Dr Helane Rima  of left labia majora showed extramammary pagets.  Last colonoscopy 11/16  Surgery was performed on 01/22/16 (left vulvectomy) and pagets was positive on the 9 o'clock margin.  The patient requested re-exicision and was taken back on 02/14/16, Pathology showed a focal positive margin at 3 o'clock. She developed postop cellulitis and separation of the wound  on week which resolved with antibiotics and expectant management.  When seen in February, 2018 she reported a new itch at anterior vulva (near clitoris). There was an area on the posterior left vaginal introitus that was acetowhite and was biopsied showing no pagets or dysplasia, only lichen sclerosis.  Interval Hx: She used clobetasol for four weeks in early 2018 and noted resolution of symptoms. She has had no complaints since that time. Does feel some persistent but stable itch symptoms Left > right.   Current Outpatient Medications on File Prior to Visit  Medication Sig Dispense Refill  . Ascorbic Acid (VITAMIN C) 1000 MG tablet Take 1,000 mg by mouth daily.     . Cholecalciferol (VITAMIN D) 2000 UNITS tablet Take 2,000 Units by mouth daily.    Marland Kitchen estradiol (MINIVELLE) 0.05 MG/24HR patch twice a week. patch    . magnesium 30 MG tablet Take 30 mg by mouth daily.     Marland Kitchen OVER THE COUNTER MEDICATION Wheat grass daily    . TURMERIC PO Take 1 capsule by mouth daily.     . Aspirin-Acetaminophen-Caffeine (EXCEDRIN PO) Take by mouth as needed. Reported on 05/28/2015     No current facility-administered medications on file prior to visit.    Allergies  Allergen Reactions  . Codeine Itching, Nausea And Vomiting and Rash    "comes out like a burn/rash"   Past Medical History:  Diagnosis Date  . Arthritis   . GERD (gastroesophageal reflux disease)   . History of cervical dysplasia   . History of colon polyps   .  History of gastric ulcer    2013  . History of squamous cell carcinoma excision    left elbow  . Lichen simplex chronicus   . Paget's disease of vulva    first dx 2013  . PONV (postoperative nausea and vomiting)   . Psoriasis   . Scoliosis    Past Surgical History:  Procedure Laterality Date  . BLADDER SUSPENSION  2008   sling  . BUNIONECTOMY  2008  . CARDIOVASCULAR STRESS TEST  09-30-2013   dr Marlou Porch   normal nuclear study/  no ischemia/  normal LV function and wall  motion,  ef 61%  . CATARACT EXTRACTION W/ INTRAOCULAR LENS  IMPLANT, BILATERAL    . HAND SURGERY Right 11/02/12   hand reconstruction at [facility]  . TONSILLECTOMY  108  age 35  . VAGINAL HYSTERECTOMY  39  age 40  . VULVA Milagros Loll BIOPSY N/A 08/29/2014   Procedure: Georgena Spurling;  Surgeon: Everitt Amber, MD;  Location: [facility];  Service: Gynecology;  Laterality: N/A;  . VULVECTOMY N/A 08/29/2014   Procedure: WIDE LOCAL EXCISION OF VULVA;  Surgeon: Everitt Amber, MD;  Location: [place];  Service: Gynecology;  Laterality: N/A;  . VULVECTOMY  2013  . VULVECTOMY N/A 05/04/2015   Procedure: WIDE LOCAL EXCISION LEFT  VULVAR WITH BIOPSY OF RIGHT VULVA;  Surgeon: Everitt Amber, MD;  Location: [place];  Service: Gynecology;  Laterality: N/A;  . VULVECTOMY Left 01/22/2016   Procedure: PARTIAL SIMPLE VULVECTOMY;  Surgeon: Everitt Amber, MD;  Location: [facility];  Service: Gynecology;  Laterality: Left;  Marland Kitchen VULVECTOMY N/A 02/14/2016   Procedure: WIDE EXCISION VULVECTOMY;  Surgeon: Everitt Amber, MD;  Location: [facility];  Service: Gynecology;  Laterality: N/A;   Family History  Problem Relation Age of Onset  . Aortic aneurysm Mother   . AAA (abdominal aortic aneurysm) Mother   . Diabetes Father   . Other Father        enlarged heart  . Arrhythmia Brother   . Heart attack Maternal Grandfather   . Arrhythmia Brother   . Thyroid cancer Daughter 56   Social History   Socioeconomic History  . Marital status: Divorced    Spouse name: Not on file  . Number of children: Not on file  . Years of education: Not on file  . Highest education level: Not on file  Occupational History  . Not on file  Social Needs  . Financial resource strain: Not on file  . Food insecurity:    Worry: Not on file    Inability: Not on file  . Transportation needs:    Medical: Not on file    Non-medical: Not on file  Tobacco Use  . Smoking  status: Former Smoker    Packs/day: 1.00    Years: 5.00    Pack years: 5.00    Types: Cigarettes    Last attempt to quit: 10/05/1978    Years since quitting: 38.9  . Smokeless tobacco: Never Used  Substance and Sexual Activity  . Alcohol use: No    Alcohol/week: 0.6 - 1.2 oz    Types: 1 - 2 Glasses of wine per week    Comment: occas  . Drug use: No  . Sexual activity: Not on file  Lifestyle  . Physical activity:    Days per week: Not on file    Minutes per session: Not on file  . Stress: Not on file  Relationships  . Social connections:    Talks on phone: Not on file    Gets together: Not on file    Attends religious service: Not on file    Active member of club or organization: Not on file    Attends meetings of clubs or organizations: Not on file    Relationship status: Not on file  . Intimate partner violence:    Fear of current or ex partner: Not on file    Emotionally abused: Not on file    Physically abused: Not on file    Forced sexual activity: Not on file  Other Topics Concern  . Not on file  Social History Narrative   ** Merged History Encounter **        Review of systems: Constitutional:  She has no weight gain or weight loss. She has no fever or chills. Eyes: No blurred vision Ears, Nose, Mouth, Throat: No dizziness, headaches or changes in hearing. No mouth sores. Cardiovascular: No chest pain, palpitations or edema. Respiratory:  No shortness of breath, wheezing or cough Gastrointestinal: She has normal bowel movements without diarrhea or constipation. She denies any nausea or vomiting. She denies blood in her stool or heart burn. Genitourinary:  She denies pelvic pain, pelvic pressure or changes in her urinary function. She has no hematuria, dysuria, or incontinence. She has no irregular vaginal bleeding or vaginal discharge Musculoskeletal: Denies muscle weakness or joint pains.  Skin:  She has no skin changes, rashes or itching Neurological:  Denies  dizziness or headaches. No neuropathy, no numbness or tingling. Psychiatric:  She denies depression or anxiety. Hematologic/Lymphatic:   No easy bruising or bleeding   Physical Exam: Blood pressure (!) 124/53, pulse 86, temperature 98.5 F (36.9 C), temperature source Oral, resp. rate 20, height 5\' 3"  (1.6 m), weight 122 lb 6.4 oz (55.5 kg). General: Well dressed, well nourished in no apparent distress.   HEENT:  Normocephalic and atraumatic, no lesions.  Extraocular muscles intact. Sclerae anicteric. Pupils equal, round, reactive. No mouth sores or ulcers. Thyroid is normal size, not nodular, midline. Skin:  No lesions or rashes. Breasts:  deferred Lungs:  deferred Cardiovascular:  deferred Abdomen:  deferred Genitourinary: Left vulvar no longer erythematous with no spreading on surrounding skin. No fluctuance or undrained fluid. No open wound or ulceration - completely healed. Very atrophic vulva. With application of acetic acid there no erythematous or acetowhite skin changes.  Extremities: No cyanosis, clubbing or edema.  No calf tenderness or erythema. No palpable cords. Psychiatric: Mood and affect are appropriate. Neurological: Awake, alert and oriented x 3. Sensation is intact, no neuropathy.  Musculoskeletal: No pain, normal strength and range of motion.  Thereasa Solo, MD

## 2017-09-16 ENCOUNTER — Ambulatory Visit: Payer: Self-pay | Admitting: Gynecologic Oncology

## 2017-12-04 ENCOUNTER — Ambulatory Visit: Payer: Self-pay | Admitting: Gynecologic Oncology

## 2018-03-08 ENCOUNTER — Telehealth: Payer: Self-pay | Admitting: *Deleted

## 2018-03-08 NOTE — Telephone Encounter (Signed)
Patient called and stated "I just saw Dr.Grewal my gyn about two weeks ago. That appt went well, I believe Dr. Denman George would want to move my appt Wednesday out farther." Per [name] APP ok to move appt three months out. Appt moved,.

## 2018-03-10 ENCOUNTER — Inpatient Hospital Stay: Payer: BC Managed Care – PPO | Admitting: Gynecologic Oncology

## 2018-06-03 ENCOUNTER — Encounter: Payer: Self-pay | Admitting: Gynecologic Oncology

## 2018-06-03 ENCOUNTER — Inpatient Hospital Stay: Payer: BC Managed Care – PPO | Attending: Gynecologic Oncology | Admitting: Gynecologic Oncology

## 2018-06-03 VITALS — BP 110/54 | HR 90 | Temp 97.8°F | Resp 16 | Ht 62.5 in | Wt 121.0 lb

## 2018-06-03 DIAGNOSIS — C4499 Other specified malignant neoplasm of skin, unspecified: Secondary | ICD-10-CM

## 2018-06-03 DIAGNOSIS — C519 Malignant neoplasm of vulva, unspecified: Secondary | ICD-10-CM | POA: Diagnosis present

## 2018-06-03 DIAGNOSIS — Z90710 Acquired absence of both cervix and uterus: Secondary | ICD-10-CM

## 2018-06-03 DIAGNOSIS — L280 Lichen simplex chronicus: Secondary | ICD-10-CM | POA: Diagnosis not present

## 2018-06-03 DIAGNOSIS — Z87891 Personal history of nicotine dependence: Secondary | ICD-10-CM

## 2018-06-03 NOTE — Progress Notes (Signed)
GYN ONC FOLLOW UP  Assessment:    63 y.o. 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   S/p simple partial left vulvectomy on 08/29/14 and 01/22/16.  S/p wide local excision of left labia on 02/14/16 for positive margins pagets with positive margin at 3 o'clock.  No evidence of recurrent pagets at this time.  Plan: Continue premarin three times a week to vagina to treat vulvovaginal atrophy. Recheck vulva in 12 months for Pagets surveillance.   HPI:  Stephanie Sanchez is a 89 y.o. year old initially seen in consultation on 07/02/12 for extramammary pagets.  She then underwent a simple partial left vulvectomy on 1/60/10 without complications.  Her postoperative course was uncomplicated.  Her final pathology revealed extramammary pagets of the left lateral labia majora with inked bilateral margins and 12 o'clock margin. The biopsies from the pruritic vulva on the posterior vagina and perinal regions revealed benign hyperplasia consistent with lichen simplex chronicus.  When I saw the patient in December, 2016 she had noticed the development of a "cyst" in the left posterior labia majora in the past month. It has decreased in size from a "kidney bean" size to now a "pea size". Tender. Increases in size when she wears pants.   On 05/04/15 she went to the OR for an excision of the posterior left labia majora which revealed a benign epidermal inclusion cyst. Random biopsies from the left and right mid labia minora and perineum were negative for LS and pagets.  Biopsy performed on 8/24/17by Dr Helane Rima  of left labia majora showed extramammary pagets.  Last colonoscopy 11/16  Surgery was performed on 01/22/16 (left vulvectomy) and pagets was positive on the 9 o'clock margin.  The patient requested re-exicision and was taken back on 02/14/16, Pathology showed a focal positive margin at 3 o'clock. She developed postop cellulitis and separation of  the wound on week which resolved with antibiotics and expectant management.  When seen in February, 2018 she reported a new itch at anterior vulva (near clitoris). There was an area on the posterior left vaginal introitus that was acetowhite and was biopsied showing no pagets or dysplasia, only lichen sclerosis.  Interval Hx: She used clobetasol for four weeks in early 2018 and noted resolution of symptoms. She has had no complaints since that time. Does feel some intermittent itch symptoms but nothing progressive.  She noticed breast and nipple changes in December, 2019 with change in her estrogen patch. Mammogram in January, 2020 was normal.   Current Outpatient Medications on File Prior to Visit  Medication Sig Dispense Refill  . Ascorbic Acid (VITAMIN C) 1000 MG tablet Take 1,000 mg by mouth daily.     . Cholecalciferol (VITAMIN D) 2000 UNITS tablet Take 2,000 Units by mouth daily.    Marland Kitchen estradiol (MINIVELLE) 0.05 MG/24HR patch twice a week. patch    . magnesium 30 MG tablet Take 30 mg by mouth daily.     Marland Kitchen OVER THE COUNTER MEDICATION Wheat grass daily    . TURMERIC PO Take 1 capsule by mouth daily.     . Aspirin-Acetaminophen-Caffeine (EXCEDRIN PO) Take by mouth as needed. Reported on 05/28/2015     No current facility-administered medications on file prior to visit.    Allergies  Allergen Reactions  . Codeine Itching, Nausea And Vomiting and Rash    "comes out like a burn/rash"   Past Medical History:  Diagnosis Date  . Arthritis   .  GERD (gastroesophageal reflux disease)   . History of cervical dysplasia   . History of colon polyps   . History of gastric ulcer    2013  . History of squamous cell carcinoma excision    left elbow  . Lichen simplex chronicus   . Paget's disease of vulva    first dx 2013  . PONV (postoperative nausea and vomiting)   . Psoriasis   . Scoliosis    Past Surgical History:  Procedure Laterality Date  . BLADDER SUSPENSION  2008   sling  .  BUNIONECTOMY  2008  . CARDIOVASCULAR STRESS TEST  09-30-2013   dr Marlou Porch   normal nuclear study/  no ischemia/  normal LV function and wall motion,  ef 61%  . CATARACT EXTRACTION W/ INTRAOCULAR LENS  IMPLANT, BILATERAL    . HAND SURGERY Right 11/02/12   hand reconstruction at [facility]  . TONSILLECTOMY  44  age 27  . VAGINAL HYSTERECTOMY  50  age 36  . VULVA Milagros Loll BIOPSY N/A 08/29/2014   Procedure: Georgena Spurling;  Surgeon: Everitt Amber, MD;  Location: [facility];  Service: Gynecology;  Laterality: N/A;  . VULVECTOMY N/A 08/29/2014   Procedure: WIDE LOCAL EXCISION OF VULVA;  Surgeon: Everitt Amber, MD;  Location: [place];  Service: Gynecology;  Laterality: N/A;  . VULVECTOMY  2013  . VULVECTOMY N/A 05/04/2015   Procedure: WIDE LOCAL EXCISION LEFT  VULVAR WITH BIOPSY OF RIGHT VULVA;  Surgeon: Everitt Amber, MD;  Location: [place];  Service: Gynecology;  Laterality: N/A;  . VULVECTOMY Left 01/22/2016   Procedure: PARTIAL SIMPLE VULVECTOMY;  Surgeon: Everitt Amber, MD;  Location: [facility];  Service: Gynecology;  Laterality: Left;  Marland Kitchen VULVECTOMY N/A 02/14/2016   Procedure: WIDE EXCISION VULVECTOMY;  Surgeon: Everitt Amber, MD;  Location: [facility];  Service: Gynecology;  Laterality: N/A;   Family History  Problem Relation Age of Onset  . Aortic aneurysm Mother   . AAA (abdominal aortic aneurysm) Mother   . Diabetes Father   . Other Father        enlarged heart  . Arrhythmia Brother   . Heart attack Maternal Grandfather   . Arrhythmia Brother   . Thyroid cancer Daughter 42   Social History   Socioeconomic History  . Marital status: Divorced    Spouse name: Not on file  . Number of children: Not on file  . Years of education: Not on file  . Highest education level: Not on file  Occupational History  . Not on file  Social Needs  . Financial resource strain: Not on file  . Food insecurity:    Worry: Not on  file    Inability: Not on file  . Transportation needs:    Medical: Not on file    Non-medical: Not on file  Tobacco Use  . Smoking status: Former Smoker    Packs/day: 1.00    Years: 5.00    Pack years: 5.00    Types: Cigarettes    Last attempt to quit: 10/05/1978    Years since quitting: 39.6  . Smokeless tobacco: Never Used  Substance and Sexual Activity  . Alcohol use: No    Alcohol/week: 1.0 - 2.0 standard drinks    Types: 1 - 2 Glasses of wine per week    Comment: occas  . Drug use: No  . Sexual activity: Not on file  Lifestyle  . Physical activity:  Days per week: Not on file    Minutes per session: Not on file  . Stress: Not on file  Relationships  . Social connections:    Talks on phone: Not on file    Gets together: Not on file    Attends religious service: Not on file    Active member of club or organization: Not on file    Attends meetings of clubs or organizations: Not on file    Relationship status: Not on file  . Intimate partner violence:    Fear of current or ex partner: Not on file    Emotionally abused: Not on file    Physically abused: Not on file    Forced sexual activity: Not on file  Other Topics Concern  . Not on file  Social History Narrative   ** Merged History Encounter **        Review of systems: Constitutional:  She has no weight gain or weight loss. She has no fever or chills. Eyes: No blurred vision Ears, Nose, Mouth, Throat: No dizziness, headaches or changes in hearing. No mouth sores. Cardiovascular: No chest pain, palpitations or edema. Respiratory:  No shortness of breath, wheezing or cough Gastrointestinal: She has normal bowel movements without diarrhea or constipation. She denies any nausea or vomiting. She denies blood in her stool or heart burn. Genitourinary:  She denies pelvic pain, pelvic pressure or changes in her urinary function. She has no hematuria, dysuria, or incontinence. She has no irregular vaginal bleeding or  vaginal discharge Musculoskeletal: Denies muscle weakness or joint pains.  Skin:  She has no skin changes, rashes or itching Neurological:  Denies dizziness or headaches. No neuropathy, no numbness or tingling. Psychiatric:  She denies depression or anxiety. Hematologic/Lymphatic:   No easy bruising or bleeding   Physical Exam: Blood pressure (!) 110/54, pulse 90, temperature 97.8 F (36.6 C), temperature source Oral, resp. rate 16, height 5' 2.5" (1.588 m), weight 121 lb (54.9 kg), SpO2 100 %. General: Well dressed, well nourished in no apparent distress.   HEENT:  Normocephalic and atraumatic, no lesions.  Extraocular muscles intact. Sclerae anicteric. Pupils equal, round, reactive. No mouth sores or ulcers. Thyroid is normal size, not nodular, midline. Skin:  No lesions or rashes. Breasts:  deferred Lungs:  deferred Cardiovascular:  deferred Abdomen:  deferred Genitourinary: Left vulvar no longer erythematous with no spreading on surrounding skin. No fluctuance or undrained fluid. No open wound or ulceration - completely healed. Very atrophic vulva. With application of acetic acid there no erythematous or acetowhite skin changes.  Extremities: No cyanosis, clubbing or edema.  No calf tenderness or erythema. No palpable cords. Psychiatric: Mood and affect are appropriate. Neurological: Awake, alert and oriented x 3. Sensation is intact, no neuropathy.  Musculoskeletal: No pain, normal strength and range of motion.  Thereasa Solo, MD

## 2018-06-03 NOTE — Patient Instructions (Signed)
Please notify Dr Denman George at phone number [phone] if you notice vaginal bleeding, new or different vaginal itch.  Please contact Dr Serita Grit office (at [phone]) in October to request an appointment with her for January 2021.

## 2018-06-23 ENCOUNTER — Other Ambulatory Visit: Payer: Self-pay | Admitting: Radiology

## 2018-06-23 DIAGNOSIS — N644 Mastodynia: Secondary | ICD-10-CM

## 2018-06-23 DIAGNOSIS — N6452 Nipple discharge: Secondary | ICD-10-CM

## 2018-06-24 ENCOUNTER — Other Ambulatory Visit: Payer: Self-pay | Admitting: Radiology

## 2018-06-24 DIAGNOSIS — N6459 Other signs and symptoms in breast: Secondary | ICD-10-CM

## 2018-06-24 DIAGNOSIS — N644 Mastodynia: Secondary | ICD-10-CM

## 2018-06-24 DIAGNOSIS — N6452 Nipple discharge: Secondary | ICD-10-CM

## 2018-06-28 ENCOUNTER — Ambulatory Visit
Admission: RE | Admit: 2018-06-28 | Discharge: 2018-06-28 | Disposition: A | Discharge status: Home / Self Care | Payer: BC Managed Care – PPO | Source: Ambulatory Visit | Attending: Radiology | Admitting: Radiology

## 2018-06-28 ENCOUNTER — Other Ambulatory Visit: Payer: Self-pay | Admitting: Radiology

## 2018-06-28 DIAGNOSIS — N6459 Other signs and symptoms in breast: Secondary | ICD-10-CM

## 2018-06-28 DIAGNOSIS — N644 Mastodynia: Secondary | ICD-10-CM

## 2018-06-28 DIAGNOSIS — N6452 Nipple discharge: Secondary | ICD-10-CM

## 2018-06-28 DIAGNOSIS — N6310 Unspecified lump in the right breast, unspecified quadrant: Secondary | ICD-10-CM

## 2018-07-02 ENCOUNTER — Ambulatory Visit
Admission: RE | Admit: 2018-07-02 | Discharge: 2018-07-02 | Disposition: A | Discharge status: Home / Self Care | Payer: BC Managed Care – PPO | Source: Ambulatory Visit | Attending: Radiology | Admitting: Radiology

## 2018-07-02 DIAGNOSIS — N6310 Unspecified lump in the right breast, unspecified quadrant: Secondary | ICD-10-CM

## 2018-09-28 ENCOUNTER — Other Ambulatory Visit: Payer: Self-pay | Admitting: General Surgery

## 2018-09-28 DIAGNOSIS — D369 Benign neoplasm, unspecified site: Secondary | ICD-10-CM

## 2018-09-30 ENCOUNTER — Other Ambulatory Visit: Payer: Self-pay | Admitting: General Surgery

## 2018-09-30 DIAGNOSIS — D369 Benign neoplasm, unspecified site: Secondary | ICD-10-CM

## 2018-10-29 ENCOUNTER — Other Ambulatory Visit: Payer: Self-pay

## 2018-10-29 ENCOUNTER — Encounter (HOSPITAL_BASED_OUTPATIENT_CLINIC_OR_DEPARTMENT_OTHER): Payer: Self-pay | Admitting: *Deleted

## 2018-11-05 ENCOUNTER — Other Ambulatory Visit (HOSPITAL_COMMUNITY)
Admission: RE | Admit: 2018-11-05 | Discharge: 2018-11-05 | Disposition: A | Discharge status: Home / Self Care | Payer: BC Managed Care – PPO | Source: Ambulatory Visit | Attending: General Surgery | Admitting: General Surgery

## 2018-11-05 DIAGNOSIS — Z01812 Encounter for preprocedural laboratory examination: Secondary | ICD-10-CM | POA: Diagnosis present

## 2018-11-05 DIAGNOSIS — Z1159 Encounter for screening for other viral diseases: Secondary | ICD-10-CM | POA: Insufficient documentation

## 2018-11-05 LAB — SARS CORONAVIRUS 2 (TAT 6-24 HRS): SARS Coronavirus 2: NEGATIVE

## 2018-11-08 ENCOUNTER — Ambulatory Visit
Admission: RE | Admit: 2018-11-08 | Discharge: 2018-11-08 | Disposition: A | Discharge status: Home / Self Care | Payer: BC Managed Care – PPO | Source: Ambulatory Visit | Attending: General Surgery | Admitting: General Surgery

## 2018-11-08 ENCOUNTER — Encounter (HOSPITAL_BASED_OUTPATIENT_CLINIC_OR_DEPARTMENT_OTHER)
Admission: RE | Admit: 2018-11-08 | Discharge: 2018-11-08 | Disposition: A | Discharge status: Home / Self Care | Payer: BC Managed Care – PPO | Source: Ambulatory Visit | Attending: Internal Medicine | Admitting: Internal Medicine

## 2018-11-08 ENCOUNTER — Other Ambulatory Visit: Payer: Self-pay

## 2018-11-08 DIAGNOSIS — D369 Benign neoplasm, unspecified site: Secondary | ICD-10-CM

## 2018-11-08 NOTE — Progress Notes (Signed)
Ensure pre surgery drink given with instructions, pt verbalized understanding.

## 2018-11-09 ENCOUNTER — Encounter (HOSPITAL_BASED_OUTPATIENT_CLINIC_OR_DEPARTMENT_OTHER)
Admission: RE | Disposition: A | Discharge status: Home / Self Care | Payer: Self-pay | Source: Home / Self Care | Attending: General Surgery

## 2018-11-09 ENCOUNTER — Ambulatory Visit (HOSPITAL_BASED_OUTPATIENT_CLINIC_OR_DEPARTMENT_OTHER): Payer: BC Managed Care – PPO | Admitting: Certified Registered"

## 2018-11-09 ENCOUNTER — Ambulatory Visit
Admission: RE | Admit: 2018-11-09 | Discharge: 2018-11-09 | Disposition: A | Discharge status: Home / Self Care | Payer: BC Managed Care – PPO | Source: Ambulatory Visit | Attending: General Surgery | Admitting: General Surgery

## 2018-11-09 ENCOUNTER — Other Ambulatory Visit (HOSPITAL_BASED_OUTPATIENT_CLINIC_OR_DEPARTMENT_OTHER): Payer: BC Managed Care – PPO

## 2018-11-09 ENCOUNTER — Encounter (HOSPITAL_BASED_OUTPATIENT_CLINIC_OR_DEPARTMENT_OTHER): Payer: Self-pay | Admitting: *Deleted

## 2018-11-09 ENCOUNTER — Ambulatory Visit (HOSPITAL_BASED_OUTPATIENT_CLINIC_OR_DEPARTMENT_OTHER)
Admission: RE | Admit: 2018-11-09 | Discharge: 2018-11-09 | Disposition: A | Discharge status: Home / Self Care | Payer: BC Managed Care – PPO | Attending: General Surgery | Admitting: General Surgery

## 2018-11-09 ENCOUNTER — Other Ambulatory Visit: Payer: Self-pay

## 2018-11-09 DIAGNOSIS — N6489 Other specified disorders of breast: Secondary | ICD-10-CM | POA: Diagnosis not present

## 2018-11-09 DIAGNOSIS — I10 Essential (primary) hypertension: Secondary | ICD-10-CM | POA: Insufficient documentation

## 2018-11-09 DIAGNOSIS — K219 Gastro-esophageal reflux disease without esophagitis: Secondary | ICD-10-CM | POA: Diagnosis not present

## 2018-11-09 DIAGNOSIS — Z87891 Personal history of nicotine dependence: Secondary | ICD-10-CM | POA: Diagnosis not present

## 2018-11-09 DIAGNOSIS — Z79890 Hormone replacement therapy (postmenopausal): Secondary | ICD-10-CM | POA: Diagnosis not present

## 2018-11-09 DIAGNOSIS — D241 Benign neoplasm of right breast: Secondary | ICD-10-CM | POA: Insufficient documentation

## 2018-11-09 DIAGNOSIS — D369 Benign neoplasm, unspecified site: Secondary | ICD-10-CM

## 2018-11-09 HISTORY — PX: RADIOACTIVE SEED GUIDED EXCISIONAL BREAST BIOPSY: SHX6490

## 2018-11-09 SURGERY — RADIOACTIVE SEED GUIDED BREAST BIOPSY
Anesthesia: General | Site: Breast | Laterality: Right

## 2018-11-09 MED ORDER — EPHEDRINE SULFATE 50 MG/ML IJ SOLN
INTRAMUSCULAR | Status: DC | PRN
  Administered 2018-11-09 (×2): 15 mg via INTRAVENOUS

## 2018-11-09 MED ORDER — KETOROLAC TROMETHAMINE 15 MG/ML IJ SOLN
INTRAMUSCULAR | Status: AC
  Filled 2018-11-09: qty 1

## 2018-11-09 MED ORDER — BUPIVACAINE HCL (PF) 0.25 % IJ SOLN
INTRAMUSCULAR | Status: AC
  Filled 2018-11-09: qty 150

## 2018-11-09 MED ORDER — SCOPOLAMINE 1 MG/3DAYS TD PT72
1.0000 | MEDICATED_PATCH | TRANSDERMAL | Status: DC
  Administered 2018-11-09: 1.5 mg via TRANSDERMAL

## 2018-11-09 MED ORDER — SODIUM CHLORIDE (PF) 0.9 % IJ SOLN
INTRAMUSCULAR | Status: AC
  Filled 2018-11-09: qty 10

## 2018-11-09 MED ORDER — METOCLOPRAMIDE HCL 5 MG/ML IJ SOLN: 10.0000 mg | Freq: Once | INTRAMUSCULAR | Status: DC | PRN

## 2018-11-09 MED ORDER — DEXAMETHASONE SODIUM PHOSPHATE 10 MG/ML IJ SOLN
INTRAMUSCULAR | Status: AC
  Filled 2018-11-09: qty 1

## 2018-11-09 MED ORDER — FENTANYL CITRATE (PF) 100 MCG/2ML IJ SOLN
50.0000 ug | INTRAMUSCULAR | Status: DC | PRN
  Administered 2018-11-09: 100 ug via INTRAVENOUS

## 2018-11-09 MED ORDER — CEFAZOLIN SODIUM-DEXTROSE 2-4 GM/100ML-% IV SOLN
INTRAVENOUS | Status: AC
  Filled 2018-11-09: qty 100

## 2018-11-09 MED ORDER — GABAPENTIN 100 MG PO CAPS
ORAL_CAPSULE | ORAL | Status: AC
  Filled 2018-11-09: qty 1

## 2018-11-09 MED ORDER — KETOROLAC TROMETHAMINE 15 MG/ML IJ SOLN
15.0000 mg | INTRAMUSCULAR | Status: DC
  Administered 2018-11-09: 15 mg via INTRAVENOUS

## 2018-11-09 MED ORDER — ONDANSETRON HCL 4 MG/2ML IJ SOLN
INTRAMUSCULAR | Status: AC
  Filled 2018-11-09: qty 2

## 2018-11-09 MED ORDER — BUPIVACAINE HCL (PF) 0.25 % IJ SOLN
INTRAMUSCULAR | Status: DC | PRN
  Administered 2018-11-09: 10 mL

## 2018-11-09 MED ORDER — GABAPENTIN 100 MG PO CAPS
100.0000 mg | ORAL_CAPSULE | ORAL | Status: AC
  Administered 2018-11-09: 100 mg via ORAL

## 2018-11-09 MED ORDER — FENTANYL CITRATE (PF) 100 MCG/2ML IJ SOLN
INTRAMUSCULAR | Status: AC
  Filled 2018-11-09: qty 2

## 2018-11-09 MED ORDER — ONDANSETRON HCL 4 MG/2ML IJ SOLN
INTRAMUSCULAR | Status: DC | PRN
  Administered 2018-11-09: 4 mg via INTRAVENOUS

## 2018-11-09 MED ORDER — MIDAZOLAM HCL 2 MG/2ML IJ SOLN: 1.0000 mg | INTRAMUSCULAR | Status: DC | PRN

## 2018-11-09 MED ORDER — CEFAZOLIN SODIUM-DEXTROSE 2-3 GM-%(50ML) IV SOLR
INTRAVENOUS | Status: DC | PRN
  Administered 2018-11-09: 2 g via INTRAVENOUS

## 2018-11-09 MED ORDER — LIDOCAINE HCL (CARDIAC) PF 100 MG/5ML IV SOSY
PREFILLED_SYRINGE | INTRAVENOUS | Status: DC | PRN
  Administered 2018-11-09: 80 mg via INTRAVENOUS

## 2018-11-09 MED ORDER — TRAMADOL HCL 50 MG PO TABS: 50.0000 mg | ORAL_TABLET | Freq: Four times a day (QID) | ORAL | 0 refills | Status: DC | PRN

## 2018-11-09 MED ORDER — MEPERIDINE HCL 25 MG/ML IJ SOLN: 6.2500 mg | INTRAMUSCULAR | Status: DC | PRN

## 2018-11-09 MED ORDER — FENTANYL CITRATE (PF) 100 MCG/2ML IJ SOLN
25.0000 ug | INTRAMUSCULAR | Status: DC | PRN
  Administered 2018-11-09 (×2): 25 ug via INTRAVENOUS

## 2018-11-09 MED ORDER — METHYLENE BLUE 0.5 % INJ SOLN
INTRAVENOUS | Status: AC
  Filled 2018-11-09: qty 10

## 2018-11-09 MED ORDER — DEXAMETHASONE SODIUM PHOSPHATE 10 MG/ML IJ SOLN
INTRAMUSCULAR | Status: DC | PRN
  Administered 2018-11-09: 10 mg via INTRAVENOUS

## 2018-11-09 MED ORDER — PROPOFOL 10 MG/ML IV BOLUS
INTRAVENOUS | Status: DC | PRN
  Administered 2018-11-09: 150 mg via INTRAVENOUS

## 2018-11-09 MED ORDER — LACTATED RINGERS IV SOLN
INTRAVENOUS | Status: DC
  Administered 2018-11-09 (×3): via INTRAVENOUS

## 2018-11-09 MED ORDER — PROPOFOL 10 MG/ML IV BOLUS
INTRAVENOUS | Status: AC
  Filled 2018-11-09: qty 40

## 2018-11-09 MED ORDER — TRAMADOL HCL 50 MG PO TABS
50.0000 mg | ORAL_TABLET | Freq: Once | ORAL | Status: AC
  Administered 2018-11-09: 50 mg via ORAL

## 2018-11-09 MED ORDER — SCOPOLAMINE 1 MG/3DAYS TD PT72
MEDICATED_PATCH | TRANSDERMAL | Status: AC
  Filled 2018-11-09: qty 1

## 2018-11-09 MED ORDER — ACETAMINOPHEN 500 MG PO TABS
1000.0000 mg | ORAL_TABLET | ORAL | Status: AC
  Administered 2018-11-09: 1000 mg via ORAL

## 2018-11-09 MED ORDER — CEFAZOLIN SODIUM-DEXTROSE 2-4 GM/100ML-% IV SOLN: 2.0000 g | INTRAVENOUS | Status: DC

## 2018-11-09 MED ORDER — TRAMADOL HCL 50 MG PO TABS
ORAL_TABLET | ORAL | Status: AC
  Filled 2018-11-09: qty 1

## 2018-11-09 MED ORDER — LIDOCAINE 2% (20 MG/ML) 5 ML SYRINGE
INTRAMUSCULAR | Status: AC
  Filled 2018-11-09: qty 5

## 2018-11-09 MED ORDER — ACETAMINOPHEN 500 MG PO TABS
ORAL_TABLET | ORAL | Status: AC
  Filled 2018-11-09: qty 2

## 2018-11-09 SURGICAL SUPPLY — 60 items
ADH SKN CLS APL DERMABOND .7 (GAUZE/BANDAGES/DRESSINGS) ×1
APPLIER CLIP 9.375 MED OPEN (MISCELLANEOUS)
BINDER BREAST LRG (GAUZE/BANDAGES/DRESSINGS) ×1 IMPLANT
BINDER BREAST MEDIUM (GAUZE/BANDAGES/DRESSINGS) IMPLANT
BINDER BREAST XLRG (GAUZE/BANDAGES/DRESSINGS) IMPLANT
BINDER BREAST XXLRG (GAUZE/BANDAGES/DRESSINGS) IMPLANT
BLADE SURG 15 STRL LF DISP TIS (BLADE) ×1 IMPLANT
BLADE SURG 15 STRL SS (BLADE) ×2
CANISTER SUC SOCK COL 7IN (MISCELLANEOUS) IMPLANT
CANISTER SUCT 1200ML W/VALVE (MISCELLANEOUS) IMPLANT
CHLORAPREP W/TINT 26 (MISCELLANEOUS) ×2 IMPLANT
CLIP APPLIE 9.375 MED OPEN (MISCELLANEOUS) IMPLANT
CLIP VESOCCLUDE SM WIDE 6/CT (CLIP) ×1 IMPLANT
COVER BACK TABLE REUSABLE LG (DRAPES) ×2 IMPLANT
COVER MAYO STAND REUSABLE (DRAPES) ×2 IMPLANT
COVER PROBE W GEL 5X96 (DRAPES) ×2 IMPLANT
COVER WAND RF STERILE (DRAPES) IMPLANT
DECANTER SPIKE VIAL GLASS SM (MISCELLANEOUS) IMPLANT
DERMABOND ADVANCED (GAUZE/BANDAGES/DRESSINGS) ×1
DERMABOND ADVANCED .7 DNX12 (GAUZE/BANDAGES/DRESSINGS) ×1 IMPLANT
DRAPE LAPAROSCOPIC ABDOMINAL (DRAPES) ×2 IMPLANT
DRAPE UTILITY XL STRL (DRAPES) ×2 IMPLANT
DRSG TEGADERM 4X4.75 (GAUZE/BANDAGES/DRESSINGS) IMPLANT
ELECT COATED BLADE 2.86 ST (ELECTRODE) ×2 IMPLANT
ELECT REM PT RETURN 9FT ADLT (ELECTROSURGICAL) ×2
ELECTRODE REM PT RTRN 9FT ADLT (ELECTROSURGICAL) ×1 IMPLANT
GAUZE SPONGE 4X4 12PLY STRL LF (GAUZE/BANDAGES/DRESSINGS) IMPLANT
GLOVE BIO SURGEON STRL SZ7 (GLOVE) ×4 IMPLANT
GLOVE BIO SURGEON STRL SZ7.5 (GLOVE) ×2 IMPLANT
GLOVE BIOGEL PI IND STRL 7.0 (GLOVE) IMPLANT
GLOVE BIOGEL PI IND STRL 7.5 (GLOVE) ×1 IMPLANT
GLOVE BIOGEL PI INDICATOR 7.0 (GLOVE) ×1
GLOVE BIOGEL PI INDICATOR 7.5 (GLOVE) ×4
GOWN STRL REUS W/ TWL LRG LVL3 (GOWN DISPOSABLE) ×2 IMPLANT
GOWN STRL REUS W/TWL LRG LVL3 (GOWN DISPOSABLE) ×2
HEMOSTAT ARISTA ABSORB 3G PWDR (HEMOSTASIS) IMPLANT
ILLUMINATOR WAVEGUIDE N/F (MISCELLANEOUS) IMPLANT
KIT MARKER MARGIN INK (KITS) ×2 IMPLANT
LIGHT WAVEGUIDE WIDE FLAT (MISCELLANEOUS) IMPLANT
NDL HYPO 25X1 1.5 SAFETY (NEEDLE) ×1 IMPLANT
NEEDLE HYPO 25X1 1.5 SAFETY (NEEDLE) ×2 IMPLANT
NS IRRIG 1000ML POUR BTL (IV SOLUTION) IMPLANT
PACK BASIN DAY SURGERY FS (CUSTOM PROCEDURE TRAY) ×2 IMPLANT
PENCIL BUTTON HOLSTER BLD 10FT (ELECTRODE) ×2 IMPLANT
PUNCH BIOPSY DERMAL 5MM STRL (MISCELLANEOUS) ×1 IMPLANT
SLEEVE SCD COMPRESS KNEE MED (MISCELLANEOUS) ×2 IMPLANT
SPONGE LAP 4X18 RFD (DISPOSABLE) ×2 IMPLANT
STRIP CLOSURE SKIN 1/2X4 (GAUZE/BANDAGES/DRESSINGS) ×2 IMPLANT
SUT MNCRL AB 4-0 PS2 18 (SUTURE) IMPLANT
SUT MON AB 5-0 PS2 18 (SUTURE) ×1 IMPLANT
SUT SILK 2 0 SH (SUTURE) ×1 IMPLANT
SUT VIC AB 2-0 SH 27 (SUTURE) ×2
SUT VIC AB 2-0 SH 27XBRD (SUTURE) ×1 IMPLANT
SUT VIC AB 3-0 SH 27 (SUTURE) ×2
SUT VIC AB 3-0 SH 27X BRD (SUTURE) ×1 IMPLANT
SYR CONTROL 10ML LL (SYRINGE) ×2 IMPLANT
TOWEL GREEN STERILE FF (TOWEL DISPOSABLE) ×2 IMPLANT
TRAY FAXITRON CT DISP (TRAY / TRAY PROCEDURE) ×2 IMPLANT
TUBE CONNECTING 20X1/4 (TUBING) ×1 IMPLANT
YANKAUER SUCT BULB TIP NO VENT (SUCTIONS) ×1 IMPLANT

## 2018-11-09 NOTE — Op Note (Addendum)
Preoperative diagnosis: 1.  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  Right areolar thickening Postoperative diagnosis: Same as above Procedure: 1.  Right breast radioactive seed guided excisional biopsy and central duct excision 2.  Right areolar punch biopsy Surgeon: Dr. Serita Grammes Anesthesia: General Estimated blood loss: 10 cc Specimens: 1.  Right breast tissue marked with paint containing seed and clip 2.  Additional retroareolar tissue marked short superior, long lateral, double deep 3.  Right areolar punch biopsy Complications: None Drains: None Sponge and needle count was correct at completion Disposition to recovery in stable condition  Indications: This is a 63 year old female who presented with right nipple discharge.  She underwent evaluation by radiology was found have a retroareolar mass.  This was biopsied and was consistent with a papilloma.  She continues to have discharge.  She also has areolar thickening.  She has a history of extramammary Paget's disease and is concerned about that although I think that is unlikely.  We discussed a seed guided excision of this mass as well as a duct excision combined with an areolar punch biopsy.  Procedure: She had a radioactive seed placed prior to beginning.  I had these mammograms in the operating room.  After informed consent was obtained she was then taken to the operating room.  She was given antibiotics.  SCDs were in place.  She was placed under general anesthesia without complication.  She was prepped and draped in the standard sterile surgical fashion.  A surgical timeout was then performed.  I infiltrated Marcaine around the lower outer portion of the areola.  I then made a areolar incision in order to hide the scar later.  I used the neoprobe to guide excision of the radioactive seed and the surrounding tissue.  This was marked with paint.  Mammogram confirmed removal of the seed and the  clip.  I then introduced a lacrimal duct probe into the discharging duct.  I then remove the remainder of the retroareolar tissue that contained this ductal system and marked this as above with suture.  Hemostasis was observed.  I then did a 5 mm punch biopsy of the areolar skin and passed this off the table as a separate specimen.  I then closed the tissue with a 2-0 Vicryl.  The dermis was closed with 3-0 Vicryl and the skin was closed with 5-0 Monocryl.  Glue and Steri-Strips were applied.  She tolerated this well was extubated and transferred to recovery stable.

## 2018-11-09 NOTE — Interval H&P Note (Signed)
History and Physical Interval Note:  11/09/2018 8:07 AM  Stephanie Sanchez  has presented today for surgery, with the diagnosis of right breast mass, areolar tenderness.  The various methods of treatment have been discussed with the patient and family. After consideration of risks, benefits and other options for treatment, the patient has consented to  Procedure(s): RADIOACTIVE SEED GUIDED EXCISIONAL RIGHT BREAST BIOPSY AND RIGHT AREOLA BIOPSY (Right) as a surgical intervention.  The patient's history has been reviewed, patient examined, no change in status, stable for surgery.  I have reviewed the patient's chart and labs.  Questions were answered to the patient's satisfaction.     Rolm Bookbinder

## 2018-11-09 NOTE — Anesthesia Postprocedure Evaluation (Signed)
Anesthesia Post Note  Patient: Stephanie Sanchez  Procedure(s) Performed: RADIOACTIVE SEED GUIDED EXCISIONAL RIGHT BREAST BIOPSY AND RIGHT AREOLA BIOPSY (Right Breast)     Patient location during evaluation: PACU Anesthesia Type: General Level of consciousness: awake and alert and oriented Pain management: pain level controlled Vital Signs Assessment: post-procedure vital signs reviewed and stable Respiratory status: spontaneous breathing, nonlabored ventilation and respiratory function stable Cardiovascular status: blood pressure returned to baseline and stable Postop Assessment: no apparent nausea or vomiting Anesthetic complications: no    Last Vitals:  Vitals:   11/09/18 0930 11/09/18 0945  BP: 138/79 136/76  Pulse: 76 76  Resp: 10 18  Temp:    SpO2: 100% 98%    Last Pain:  Vitals:   11/09/18 0937  TempSrc:   PainSc: 4                  Hye Trawick A.

## 2018-11-09 NOTE — Transfer of Care (Signed)
Immediate Anesthesia Transfer of Care Note  Patient: Stephanie Sanchez  Procedure(s) Performed: RADIOACTIVE SEED GUIDED EXCISIONAL RIGHT BREAST BIOPSY AND RIGHT AREOLA BIOPSY (Right Breast)  Patient Location: PACU  Anesthesia Type:General  Level of Consciousness: awake, alert  and oriented  Airway & Oxygen Therapy: Patient Spontanous Breathing and Patient connected to nasal cannula oxygen  Post-op Assessment: Report given to RN and Post -op Vital signs reviewed and stable  Post vital signs: Reviewed and stable  Last Vitals:  Vitals Value Taken Time  BP    Temp    Pulse 92 11/09/18 0918  Resp    SpO2 100 % 11/09/18 0918    Last Pain:  Vitals:   11/09/18 0701  TempSrc: Oral  PainSc: 0-No pain      Patients Stated Pain Goal: 3 (28/20/81 3887)  Complications: No apparent anesthesia complications

## 2018-11-09 NOTE — Discharge Instructions (Signed)
No Tylenol before 1:00pm.  No ibuprofen before 3:00pm.   [facility] Phone Number [phone]   POST OP INSTRUCTIONS Take 400 mg of ibuprofen every 8 hours or 650 mg tylenol every 6 hours for next 72 hours then as needed. Use ice several times daily also. Always review your discharge instruction sheet given to you by the facility where your surgery was performed.  IF YOU HAVE DISABILITY OR FAMILY LEAVE FORMS, YOU MUST BRING THEM TO THE OFFICE FOR PROCESSING.  DO NOT GIVE THEM TO YOUR DOCTOR.  1. A prescription for pain medication may be given to you upon discharge.  Take your pain medication as prescribed, if needed.  If narcotic pain medicine is not needed, then you may take acetaminophen (Tylenol), naprosyn (Alleve) or ibuprofen (Advil) as needed. 2. Take your usually prescribed medications unless otherwise directed 3. If you need a refill on your pain medication, please contact your pharmacy.  They will contact our office to request authorization.  Prescriptions will not be filled after 5pm or on week-ends. 4. You should eat very light the first 24 hours after surgery, such as soup, crackers, pudding, etc.  Resume your normal diet the day after surgery. 5. Most patients will experience some swelling and bruising in the breast.  Ice packs and a good support bra will help.  Wear the breast binder provided or a sports bra for 72 hours day and night.  After that wear a sports bra during the day until you return to the office. Swelling and bruising can take several days to resolve.  6. It is common to experience some constipation if taking pain medication after surgery.  Increasing fluid intake and taking a stool softener will usually help or prevent this problem from occurring.  A mild laxative (Milk of Magnesia or Miralax) should be taken according to package directions if there are no bowel movements after 48 hours. 7. Unless discharge instructions indicate otherwise, you  may remove your bandages 48 hours after surgery and you may shower at that time.  You may have steri-strips (small skin tapes) in place directly over the incision.  These strips should be left on the skin for 7-10 days and will come off on their own.  If your surgeon used skin glue on the incision, you may shower in 24 hours.  The glue will flake off over the next 2-3 weeks.  Any sutures or staples will be removed at the office during your follow-up visit. 8. ACTIVITIES:  You may resume regular daily activities (gradually increasing) beginning the next day.  Wearing a good support bra or sports bra minimizes pain and swelling.  You may have sexual intercourse when it is comfortable. a. You may drive when you no longer are taking prescription pain medication, you can comfortably wear a seatbelt, and you can safely maneuver your car and apply brakes. b. RETURN TO WORK:  ______________________________________________________________________________________ 9. You should see your doctor in the office for a follow-up appointment approximately two weeks after your surgery.  Your doctors nurse will typically make your follow-up appointment when she calls you with your pathology report.  Expect your pathology report 3-4 business days after your surgery.  You may call to check if you do not hear from [place] after three days. 10. OTHER INSTRUCTIONS: _______________________________________________________________________________________________ _____________________________________________________________________________________________________________________________________ _____________________________________________________________________________________________________________________________________ _____________________________________________________________________________________________________________________________________  WHEN TO CALL DR WAKEFIELD: 1. Fever over 101.0 2. Nausea and/or  vomiting. 3. Extreme swelling or bruising. 4. Continued bleeding from incision. 5. Increased pain, redness,  or drainage from the incision.  The clinic staff is available to answer your questions during regular business hours.  Please dont hesitate to call and ask to speak to one of the nurses for clinical concerns.  If you have a medical emergency, go to the nearest emergency room or call 911.  A surgeon from [facility] is always on call at the hospital.  For further questions, please visit [email or web address]    Post Anesthesia Home Care Instructions  Activity: Get plenty of rest for the remainder of the day. A responsible individual must stay with you for 24 hours following the procedure.  For the next 24 hours, DO NOT: -Drive a car -Paediatric nurse -Drink alcoholic beverages -Take any medication unless instructed by your physician -Make any legal decisions or sign important papers.  Meals: Start with liquid foods such as gelatin or soup. Progress to regular foods as tolerated. Avoid greasy, spicy, heavy foods. If nausea and/or vomiting occur, drink only clear liquids until the nausea and/or vomiting subsides. Call your physician if vomiting continues.  Special Instructions/Symptoms: Your throat may feel dry or sore from the anesthesia or the breathing tube placed in your throat during surgery. If this causes discomfort, gargle with warm salt water. The discomfort should disappear within 24 hours.  If you had a scopolamine patch placed behind your ear for the management of post- operative nausea and/or vomiting:  1. The medication in the patch is effective for 72 hours, after which it should be removed.  Wrap patch in a tissue and discard in the trash. Wash hands thoroughly with soap and water. 2. You may remove the patch earlier than 72 hours if you experience unpleasant side effects which may include dry mouth, dizziness or visual disturbances. 3. Avoid  touching the patch. Wash your hands with soap and water after contact with the patch.

## 2018-11-09 NOTE — H&P (Signed)
63 yof referred by Stephanie Sanchez for right breast mass and dc. she has prior left breast excision 18 years ago for a papilloma. has fh in maternal first cousin with breast cancer. she noted about 8 months ago nipple inversion on right, thickening and some spontaneous occasional clear dc. she also has some intermittent pain in lateral breast. she underwent imaging and has c density breasts. she was noted to have a 4 mm benign cyst where she is tender but otherwise normal. she has nl nodes. in retroareolar breast on right she has a 4x3x4 mm mass. she underwent US guided biopsy and this is ductal papilloma. she continues to have some dc. she is referred for evaluation. 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 Cataract Surgery  Bilateral. Colon Polyp Removal - Colonoscopy  Foot Surgery  Bilateral. Hysterectomy (not due to cancer) - Partial  Oral Surgery  Sentinel Lymph Node Biopsy  Tonsillectomy   Diagnostic Studies History  Colonoscopy  1-5 years ago Mammogram  within last year Pap Smear  1-5 years ago  Allergies  Codeine/Codeine Derivatives  Nausea.  Medication History  Estradiol (0.05MG /24HR Patch TW, Transdermal) Active. Magnesium (500MG  Tablet, Oral) Active. Probiotic-Prebiotic (1-250BILLION-MG Capsule, Oral) Active. Garlic (2000MG  Tablet DR, Oral) Active. B Complex-B12 (Oral) Active. CholestOff Plus (450MG  Capsule, Oral) Active. D3 Maximum Strength (125 MCG(5000 UT) Capsule, Oral) Active. Medications Reconciled  Social History  Alcohol use  Occasional alcohol use. Caffeine use  Coffee. No drug use  Tobacco use  Former smoker.  Family History  Arthritis  Mother, Sister. Breast Cancer  Family Members In General. Colon Polyps  Brother. Diabetes Mellitus  Father. Heart Disease  Brother, Father. Heart disease in female family member before age 64   Hypertension  Brother, Mother. Kidney Disease  Father. Migraine Headache  Daughter, Sister. Thyroid problems  Daughter, Sister.  Pregnancy / Birth History Sabino Gasser, [place]; 09/28/2019 9:35 AM) Age at menarche  19 years. Age of menopause  51-54 Contraceptive History  Intrauterine device, Oral contraceptives. Gravida  2 Length (months) of breastfeeding  3-6 Maternal age  64-25 Para  2  Other Problems  Arthritis  Back Pain  Cancer  Gastric Ulcer  High blood pressure   Review of Systems  General Not Present- Appetite Loss, Chills, Fatigue, Fever, Night Sweats, Weight Gain and Weight Loss. Skin Not Present- Change in Wart/Mole, Dryness, Hives, Jaundice, New Lesions, Non-Healing Wounds, Rash and Ulcer. HEENT Present- Ringing in the Ears. Not Present- Earache, Hearing Loss, Hoarseness, Nose Bleed, Oral Ulcers, Seasonal Allergies, Sinus Pain, Sore Throat, Visual Disturbances, Wears glasses/contact lenses and Yellow Eyes. Respiratory Not Present- Bloody sputum, Chronic Cough, Difficulty Breathing, Snoring and Wheezing. Breast Present- Breast Pain, Nipple Discharge and Skin Changes. Not Present- Breast Mass. Cardiovascular Not Present- Chest Pain, Difficulty Breathing Lying Down, Leg Cramps, Palpitations, Rapid Heart Rate, Shortness of Breath and Swelling of Extremities. Gastrointestinal Not Present- Abdominal Pain, Bloating, Bloody Stool, Change in Bowel Habits, Chronic diarrhea, Constipation, Difficulty Swallowing, Excessive gas, Gets full quickly at meals, Hemorrhoids, Indigestion, Nausea, Rectal Pain and Vomiting. Female Genitourinary Not Present- Frequency, Nocturia, Painful Urination, Pelvic Pain and Urgency. Musculoskeletal Present- Back Pain. Not Present- Joint Pain, Joint Stiffness, Muscle Pain, Muscle Weakness and Swelling of Extremities. Neurological Present- Headaches. Not Present- Decreased Memory, Fainting, Numbness, Seizures, Tingling, Tremor, Trouble walking  and Weakness. Psychiatric Not Present- Anxiety, Bipolar, Change in Sleep Pattern, Depression, Fearful and Frequent crying. Endocrine Not Present- Cold Intolerance, Excessive  Hunger, Hair Changes, Heat Intolerance, Hot flashes and New Diabetes. Hematology Not Present- Blood Thinners, Easy Bruising, Excessive bleeding, Gland problems, HIV and Persistent Infections.  Physical Exam  General Mental Status-Alert. Head and Neck Trachea-midline. Eye Sclera/Conjunctiva - Bilateral-No scleral icterus. Chest and Lung Exam Chest and lung exam reveals -normal excursion with symmetric chest walls and quiet, even and easy respiratory effort with no use of accessory muscles. Breast Nipples-No Discharge. Breast Lump-No Palpable Breast Mass. Note: right areola thickened around nipple, this is asymmetric to the left, no scaling or dc noted today Cardiovascular Cardiovascular examination reveals -normal heart sounds, regular rate and rhythm with no murmurs. Abdomen Note: soft nontender no hepatomegaly Neurologic Neurologic evaluation reveals -alert and oriented x 3 with no impairment of recent or remote memory. Lymphatic Head & Neck General Head & Neck Lymphatics: Bilateral - Description - Normal. Axillary General Axillary Region: Bilateral - Description - Normal. Note: no [place] adenopathy   Assessment & Plan  PAPILLOMA OF RIGHT BREAST (D24.1) Story: Right breast seed guided excision and areolar biopsy I think would be reasonable to observe this in general but her exam is concerning to me. I think most safe to do seed guided excision of mass and an areolar punch biopsy. she is agreeable and would prefer this. we discussed surgery, preop covid testing, recovery and risks

## 2018-11-09 NOTE — Anesthesia Procedure Notes (Signed)
Procedure Name: LMA Insertion Performed by: Verita Lamb, CRNA Pre-anesthesia Checklist: Patient identified, Emergency Drugs available, Suction available, Patient being monitored and Timeout performed Patient Re-evaluated:Patient Re-evaluated prior to induction Oxygen Delivery Method: Circle system utilized Preoxygenation: Pre-oxygenation with 100% oxygen Induction Type: IV induction LMA: LMA inserted LMA Size: 3.0 Tube type: Oral Number of attempts: 1 Placement Confirmation: positive ETCO2,  CO2 detector and breath sounds checked- equal and bilateral Tube secured with: Tape Dental Injury: Teeth and Oropharynx as per pre-operative assessment

## 2018-11-09 NOTE — Anesthesia Preprocedure Evaluation (Signed)
Anesthesia Evaluation  Patient identified by MRN, date of birth, ID band Patient awake    Reviewed: Allergy & Precautions, NPO status , Patient's Chart, lab work & pertinent test results  History of Anesthesia Complications (+) PONV and history of anesthetic complications  Airway Mallampati: II  TM Distance: >3 FB Neck ROM: Full    Dental no notable dental hx. (+) Teeth Intact, Caps   Pulmonary shortness of breath, former smoker,    Pulmonary exam normal breath sounds clear to auscultation       Cardiovascular Normal cardiovascular exam Rhythm:Regular Rate:Normal     Neuro/Psych negative neurological ROS  negative psych ROS   GI/Hepatic Neg liver ROS, GERD  Medicated and Controlled,  Endo/Other  Right breast mass and tenderness  Renal/GU negative Renal ROS  negative genitourinary   Musculoskeletal  (+) Arthritis , Osteoarthritis,    Abdominal   Peds  Hematology negative hematology ROS (+)   Anesthesia Other Findings   Reproductive/Obstetrics                             Anesthesia Physical Anesthesia Plan  ASA: II  Anesthesia Plan: General   Post-op Pain Management:    Induction: Intravenous  PONV Risk Score and Plan: 4 or greater and Scopolamine patch - Pre-op, Midazolam, Ondansetron, Dexamethasone and Treatment may vary due to age or medical condition  Airway Management Planned: LMA  Additional Equipment:   Intra-op Plan:   Post-operative Plan:   Informed Consent: I have reviewed the patients History and Physical, chart, labs and discussed the procedure including the risks, benefits and alternatives for the proposed anesthesia with the patient or authorized representative who has indicated his/her understanding and acceptance.     Dental advisory given  Plan Discussed with: CRNA and Surgeon  Anesthesia Plan Comments:         Anesthesia Quick Evaluation

## 2018-11-10 ENCOUNTER — Encounter (HOSPITAL_BASED_OUTPATIENT_CLINIC_OR_DEPARTMENT_OTHER): Payer: Self-pay | Admitting: General Surgery

## 2019-03-18 ENCOUNTER — Other Ambulatory Visit: Payer: Self-pay

## 2019-03-18 ENCOUNTER — Encounter: Payer: Self-pay | Admitting: Plastic Surgery

## 2019-03-18 ENCOUNTER — Ambulatory Visit: Payer: BC Managed Care – PPO | Admitting: Plastic Surgery

## 2019-03-18 DIAGNOSIS — N62 Hypertrophy of breast: Secondary | ICD-10-CM | POA: Insufficient documentation

## 2019-03-18 DIAGNOSIS — M546 Pain in thoracic spine: Secondary | ICD-10-CM

## 2019-03-18 DIAGNOSIS — G8929 Other chronic pain: Secondary | ICD-10-CM

## 2019-03-18 DIAGNOSIS — M549 Dorsalgia, unspecified: Secondary | ICD-10-CM | POA: Insufficient documentation

## 2019-03-18 DIAGNOSIS — M542 Cervicalgia: Secondary | ICD-10-CM

## 2019-03-18 DIAGNOSIS — Z719 Counseling, unspecified: Secondary | ICD-10-CM

## 2019-03-18 NOTE — Progress Notes (Signed)
  Botulinum Toxin Procedure Note  Procedure: Cosmetic botulinum toxin   Pre-operative Diagnosis: Dynamic rhytides   Post-operative Diagnosis: Same  Complications:  None  Brief history: The patient desires botulinum toxin injection of her forehead. I discussed with the patient this proposed procedure of botulinum toxin injections, which is customized depending on the particular needs of the patient. It is performed on facial rhytids as a temporary correction. The alternatives were discussed with the patient. The risks were addressed including bleeding, scarring, infection, damage to deeper structures, asymmetry, and chronic pain, which may occur infrequently after a procedure. The individual's choice to undergo a surgical procedure is based on the comparison of risks to potential benefits. Other risks include unsatisfactory results, brow ptosis, eyelid ptosis, allergic reaction, temporary paralysis, which should go away with time, bruising, blurring disturbances and delayed healing. Botulinum toxin injections do not arrest the aging process or produce permanent tightening of the eyelid.  Operative intervention maybe necessary to maintain the results of a blepharoplasty or botulinum toxin. The patient understands and wishes to proceed. An informed consent was signed and informational brochures given to her prior to the procedure.  Procedure: The area was prepped with alcohol and dried with a clean gauze. Using a clean technique, the botulinum toxin was diluted with 1.25 cc of preservative-free normal saline which was slowly injected with an 18 gauge needle in a tuberculin syringes.  A 32 gauge needles were then used to inject the botulinum toxin. This mixture allow for an aliquot of 5 units per 0.1 cc in each injection site.    Subsequently the mixture was injected in the glabellar and forehead area with preservation of the temporal branch to the lateral eyebrow as well as into each lateral canthal area  beginning from the lateral orbital rim medial to the zygomaticus major in 3 separate areas. A total of 20 Units of botulinum toxin was used. The forehead and glabellar area was injected with care to inject intramuscular only while holding pressure on the supratrochlear vessels in each area during each injection on either side of the medial corrugators. The injection proceeded vertically superiorly to the medial 2/3 of the frontalis muscle and superior 2/3 of the lateral frontalis, again with preservation of the frontal branch.  No complications were noted. Light pressure was held for 5 minutes. She was instructed explicitly in post-operative care.  Botox LOT:  [id number] C2 EXP:  7/23

## 2019-03-18 NOTE — Progress Notes (Signed)
Patient ID: Stephanie Sanchez, female    DOB: Mar 20, 1956, 63 y.o.   MRN: [id number]   Chief Complaint  Patient presents with  . Breast Problem    Mammary Hyperplasia: The patient is a 63 y.o. female with a history of mammary hyperplasia for several years.  She has extremely large breasts causing symptoms that include the following: Back pain in the upper and lower back, including neck pain. She pulls or pins her bra straps to provide better lift and relief of the pressure and pain. She notices relief by holding her breast up manually.  Her shoulder straps cause grooves and pain and pressure that requires padding for relief. Pain medication is sometimes required with motrin and tylenol.  Activities that are hindered by enlarged breasts include: exercise and running.  She has been to a spine doctor who has recommended that she undergo bilateral breast reduction.  She has had physical therapy without improvement.    Her breasts are extremely large and fairly symmetric with the right slightly larger.  She has hyperpigmentation of the inframammary area on both sides.  The sternal to nipple distance on the right is 28 cm and the left is 28 cm.  The IMF distance is 12 cm.  She is 5 feet 2 inches tall and weighs 121 pounds.  Preoperative bra size = 36 DD cup.  Her circumference would be better fitted for 34.  She would like to be a C cup if possible.  The estimated excess breast tissue to be removed at the time of surgery = 285 grams on the left and 285 grams on the right.  Mammogram history: Her last mammogram was a few months ago.  She had a small lumpectomy of the right breast and it was negative for cancer.  She also complains of frequent rashes in her inframammary folds that are only helped with creams and powders.  She has a history of scoliosis.  She has a sister and a daughter who underwent a breast reduction.  She has a cousin that was treated for breast cancer.   Review of Systems   Constitutional: Positive for activity change. Negative for appetite change.  HENT: Negative.   Eyes: Negative.   Respiratory: Negative.  Negative for chest tightness and shortness of breath.   Cardiovascular: Negative.  Negative for leg swelling.  Gastrointestinal: Negative for abdominal pain.  Endocrine: Negative.   Genitourinary: Negative.   Musculoskeletal: Positive for back pain.  Skin: Negative for color change and wound.  Neurological: Negative.   Hematological: Negative.   Psychiatric/Behavioral: Negative.     Past Medical History:  Diagnosis Date  . Arthritis   . GERD (gastroesophageal reflux disease)   . History of cervical dysplasia   . History of colon polyps   . History of gastric ulcer    2013  . History of squamous cell carcinoma excision    left elbow  . Lichen simplex chronicus   . Paget's disease of vulva    first dx 2013  . PONV (postoperative nausea and vomiting)   . Psoriasis   . Scoliosis     Past Surgical History:  Procedure Laterality Date  . BLADDER SUSPENSION  2008   sling  . BREAST EXCISIONAL BIOPSY Left   . BUNIONECTOMY  2008  . CARDIOVASCULAR STRESS TEST  09-30-2013   dr Marlou Porch   normal nuclear study/  no ischemia/  normal LV function and wall motion,  ef 61%  . CATARACT EXTRACTION  W/ INTRAOCULAR LENS  IMPLANT, BILATERAL    . HAND SURGERY Right 11/02/12   hand reconstruction at [facility]  . RADIOACTIVE SEED GUIDED EXCISIONAL BREAST BIOPSY Right 11/09/2018   Procedure: RADIOACTIVE SEED GUIDED EXCISIONAL RIGHT BREAST BIOPSY AND RIGHT AREOLA BIOPSY;  Surgeon: Rolm Bookbinder, MD;  Location: [place];  Service: General;  Laterality: Right;  . TONSILLECTOMY  14  age 69  . VAGINAL HYSTERECTOMY  30  age 21  . VULVA Milagros Loll BIOPSY N/A 08/29/2014   Procedure: Georgena Spurling;  Surgeon: Everitt Amber, MD;  Location: [facility];  Service: Gynecology;  Laterality: N/A;  . VULVECTOMY N/A 08/29/2014   Procedure: WIDE LOCAL  EXCISION OF VULVA;  Surgeon: Everitt Amber, MD;  Location: [place];  Service: Gynecology;  Laterality: N/A;  . VULVECTOMY  2013  . VULVECTOMY N/A 05/04/2015   Procedure: WIDE LOCAL EXCISION LEFT  VULVAR WITH BIOPSY OF RIGHT VULVA;  Surgeon: Everitt Amber, MD;  Location: [place];  Service: Gynecology;  Laterality: N/A;  . VULVECTOMY Left 01/22/2016   Procedure: PARTIAL SIMPLE VULVECTOMY;  Surgeon: Everitt Amber, MD;  Location: [facility];  Service: Gynecology;  Laterality: Left;  Marland Kitchen VULVECTOMY N/A 02/14/2016   Procedure: WIDE EXCISION VULVECTOMY;  Surgeon: Everitt Amber, MD;  Location: [facility];  Service: Gynecology;  Laterality: N/A;      Current Outpatient Medications:  .  calcium gluconate 500 MG tablet, Take 2 tablets by mouth 3 (three) times daily., Disp: , Rfl:  .  co-enzyme Q-10 30 MG capsule, Take 30 mg by mouth 3 (three) times daily., Disp: , Rfl:  .  lactobacillus acidophilus (BACID) TABS tablet, Take 2 tablets by mouth 3 (three) times daily., Disp: , Rfl:  .  Ascorbic Acid (VITAMIN C) 1000 MG tablet, Take 1,000 mg by mouth daily. , Disp: , Rfl:  .  Cholecalciferol (VITAMIN D) 2000 UNITS tablet, Take 2,000 Units by mouth daily., Disp: , Rfl:  .  estradiol (MINIVELLE) 0.05 MG/24HR patch, twice a week. patch, Disp: , Rfl:  .  magnesium 30 MG tablet, Take 30 mg by mouth daily. , Disp: , Rfl:  .  traMADol (ULTRAM) 50 MG tablet, Take 1 tablet (50 mg total) by mouth every 6 (six) hours as needed. (Patient not taking: Reported on 03/18/2019), Disp: 6 tablet, Rfl: 0   Objective:   Vitals:   03/18/19 1144  BP: 138/75  Pulse: 81  Temp: 98.3 F (36.8 C)  SpO2: 100%    Physical Exam Vitals signs and nursing note reviewed.  Constitutional:      Appearance: Normal appearance.  HENT:     Head: Normocephalic and atraumatic.  Eyes:     Extraocular Movements: Extraocular movements intact.  Cardiovascular:     Rate and Rhythm:  Normal rate.     Pulses: Normal pulses.  Pulmonary:     Effort: Pulmonary effort is normal. No respiratory distress.     Breath sounds: No wheezing.  Abdominal:     General: Abdomen is flat. There is no distension.     Tenderness: There is no abdominal tenderness.  Skin:    General: Skin is warm.     Capillary Refill: Capillary refill takes less than 2 seconds.  Neurological:     General: No focal deficit present.     Mental Status: She is alert and oriented to person, place, and time.  Psychiatric:        Mood and Affect: Mood  normal.        Behavior: Behavior normal.        Thought Content: Thought content normal.     Assessment & Plan:  Encounter for counseling  Symptomatic mammary hypertrophy  Neck pain  Chronic bilateral thoracic back pain  Recommend bilateral breast reduction.  She most likely could have the superior medial pedicle technique.  She signed a release of information for her mammogram and physical therapy notes. Pictures were obtained of the patient and placed in the chart with the patient's or guardian's permission. She would like to see if she can be done this year if possible.  [place], DO

## 2019-03-28 ENCOUNTER — Telehealth: Payer: Self-pay | Admitting: *Deleted

## 2019-03-28 ENCOUNTER — Other Ambulatory Visit: Payer: Self-pay | Admitting: *Deleted

## 2019-03-28 DIAGNOSIS — E785 Hyperlipidemia, unspecified: Secondary | ICD-10-CM

## 2019-03-28 DIAGNOSIS — R0602 Shortness of breath: Secondary | ICD-10-CM

## 2019-03-28 NOTE — Telephone Encounter (Signed)
This is fine by me. Mark Skains, MD  

## 2019-03-28 NOTE — Telephone Encounter (Signed)
Called patient to schedule CT CA Score for Dr. Joylene Draft patients Primary Care Physician.    Patient is requesting a change in providers. Patient saw Dr.Skains in 2015 and would like to change providers to Dr. Tamala Julian. Patient if familiar with Dr. Tamala Julian through family member.

## 2019-04-08 ENCOUNTER — Telehealth: Payer: Self-pay | Admitting: *Deleted

## 2019-04-08 NOTE — Telephone Encounter (Signed)
Patient called and stated "I wanted to ask [name] APP if I needed to see Dr Denman George next week. I recently saw both Dr Helane Rima and Dr Joylene Draft. I had no itching or signs of pagets at all. I was wondering if I still need to come. I really don't want to come up there." Explained that I thought she should be able to wait another 6 months and then saee Dr Denman George, But that I would ask [name] APP and call her back.

## 2019-04-11 ENCOUNTER — Telehealth: Payer: Self-pay | Admitting: Plastic Surgery

## 2019-04-11 ENCOUNTER — Telehealth: Payer: Self-pay | Admitting: *Deleted

## 2019-04-11 NOTE — Telephone Encounter (Signed)

## 2019-04-11 NOTE — Telephone Encounter (Signed)
Returned the patient's call and moved her appt from December to [place]

## 2019-04-12 ENCOUNTER — Other Ambulatory Visit: Payer: Self-pay

## 2019-04-12 ENCOUNTER — Ambulatory Visit (INDEPENDENT_AMBULATORY_CARE_PROVIDER_SITE_OTHER): Payer: BC Managed Care – PPO | Admitting: Plastic Surgery

## 2019-04-12 ENCOUNTER — Other Ambulatory Visit: Payer: Self-pay | Admitting: Plastic Surgery

## 2019-04-12 ENCOUNTER — Encounter: Payer: Self-pay | Admitting: Plastic Surgery

## 2019-04-12 VITALS — BP 159/107 | HR 81 | Temp 97.3°F | Ht 63.0 in | Wt 122.2 lb

## 2019-04-12 DIAGNOSIS — N62 Hypertrophy of breast: Secondary | ICD-10-CM

## 2019-04-12 DIAGNOSIS — Z1231 Encounter for screening mammogram for malignant neoplasm of breast: Secondary | ICD-10-CM

## 2019-04-12 DIAGNOSIS — M546 Pain in thoracic spine: Secondary | ICD-10-CM

## 2019-04-12 DIAGNOSIS — M542 Cervicalgia: Secondary | ICD-10-CM

## 2019-04-12 DIAGNOSIS — G8929 Other chronic pain: Secondary | ICD-10-CM

## 2019-04-12 MED ORDER — ONDANSETRON HCL 4 MG PO TABS: 4.0000 mg | ORAL_TABLET | Freq: Three times a day (TID) | ORAL | 0 refills | Status: DC | PRN

## 2019-04-12 MED ORDER — CEPHALEXIN 500 MG PO CAPS: 500.0000 mg | ORAL_CAPSULE | Freq: Four times a day (QID) | ORAL | 0 refills | Status: AC

## 2019-04-12 MED ORDER — HYDROCODONE-ACETAMINOPHEN 5-325 MG PO TABS: 1.0000 | ORAL_TABLET | Freq: Two times a day (BID) | ORAL | 0 refills | Status: AC | PRN

## 2019-04-12 NOTE — Progress Notes (Signed)
ICD-10-CM   1. Symptomatic mammary hypertrophy  N62   2. Neck pain  M54.2   3. Chronic bilateral thoracic back pain  M54.6    G89.29       Patient ID: Stephanie Sanchez, female    DOB: Feb 02, 1956, 63 y.o.   MRN: [id number]   History of Present Illness: Stephanie Sanchez is a 63 y.o.  female  with a history of mammary hyperplasia for several years.  She presents for preoperative evaluation for upcoming procedure, bilateral breast reduction, scheduled for 04/27/2019  with Dr. Marla Roe.   She has extremely large breasts causing symptoms that include the following:  Back pain in the upper and lower back, including neck pain. She pulls or pins her bra straps to provide better lift and relief of the pressure and pain. She notices relief by holding her breast up manually.  Her shoulder straps cause grooves and pain and pressure that requires padding for relief. Pain medication is sometimes required with motrin and tylenol.  Activities that are hindered by enlarged breasts include: exercise and daily activities. History of scoliosis aggravated by weight of large breasts. Her spine doctor recommended she undergo bilateral breast reduction.  She has had physical therapy without improvement.  Her breasts are extremely large and fairly symmetric with the right slightly larger.  The sternal to nipple distance on the right is 28 cm and the left is 28 cm.  The IMF distance is 12 cm.  She is 5 feet 2 inches tall and weighs 122 pounds.  Preoperative bra size = 36 DD cup. Her circumference would be better fitted for 34.  She would like to be a C cup if possible and error on the size of smaller. Her biggest concern is she would like final result to be more lifted. Her daughter and sister have also undergone breast reductions.   Reports frequent rashes in her IMFs that are helped, but never fully cleared, by powders and creams. Sees a dermatologist for this.   Mammogram history:  Scheduled at the breast center  Friday 12/11. Last bilateral mammogram was Dec 2019. Hx of  Excisional biopsy of papilloma right breast. 11/09/2018  The patient has had anesthesia or sedation in the past.   The patient had nausea with anesthesia in the past and used the patch with good results.  She has a first cousin who had breast cancer. No personal history of breast cancer. PMH significant for Vulvar Paget's Disease.   Photos in chart (03/18/2019 visit)  Past Medical History: Allergies: Allergies  Allergen Reactions  . Codeine Itching, Nausea And Vomiting and Rash    "comes out like a burn/rash"    Current Medications:  Current Outpatient Medications:  .  Ascorbic Acid (VITAMIN C) 1000 MG tablet, Take 1,000 mg by mouth daily. , Disp: , Rfl:  .  calcium gluconate 500 MG tablet, Take 2 tablets by mouth 3 (three) times daily., Disp: , Rfl:  .  Cholecalciferol (VITAMIN D) 2000 UNITS tablet, Take 2,000 Units by mouth daily., Disp: , Rfl:  .  co-enzyme Q-10 30 MG capsule, Take 30 mg by mouth 3 (three) times daily., Disp: , Rfl:  .  estradiol (MINIVELLE) 0.05 MG/24HR patch, twice a week. patch, Disp: , Rfl:  .  Garlic [id number] MG CAPS, garlic, Disp: , Rfl:  .  lactobacillus acidophilus (BACID) TABS tablet, Take 1 tablet by mouth daily. , Disp: , Rfl:  .  magnesium gluconate (MAGONATE) 500 MG tablet, Take  500 mg by mouth daily., Disp: , Rfl:  .  OVER THE COUNTER MEDICATION, Ester C 1000mg -Take tablet by mouth daily., Disp: , Rfl:   Past Medical Problems: Past Medical History:  Diagnosis Date  . Arthritis   . GERD (gastroesophageal reflux disease)   . History of cervical dysplasia   . History of colon polyps   . History of gastric ulcer    2013  . History of squamous cell carcinoma excision    left elbow  . Lichen simplex chronicus   . Paget's disease of vulva    first dx 2013  . PONV (postoperative nausea and vomiting)   . Psoriasis   . Scoliosis     Past Surgical History: Past Surgical History:  Procedure  Laterality Date  . BLADDER SUSPENSION  2008   sling  . BREAST EXCISIONAL BIOPSY Left   . BUNIONECTOMY  2008  . CARDIOVASCULAR STRESS TEST  09-30-2013   dr Marlou Porch   normal nuclear study/  no ischemia/  normal LV function and wall motion,  ef 61%  . CATARACT EXTRACTION W/ INTRAOCULAR LENS  IMPLANT, BILATERAL    . HAND SURGERY Right 11/02/12   hand reconstruction at [facility]  . RADIOACTIVE SEED GUIDED EXCISIONAL BREAST BIOPSY Right 11/09/2018   Procedure: RADIOACTIVE SEED GUIDED EXCISIONAL RIGHT BREAST BIOPSY AND RIGHT AREOLA BIOPSY;  Surgeon: Rolm Bookbinder, MD;  Location: [place];  Service: General;  Laterality: Right;  . TONSILLECTOMY  58  age 72  . VAGINAL HYSTERECTOMY  25  age 39  . VULVA Milagros Loll BIOPSY N/A 08/29/2014   Procedure: Georgena Spurling;  Surgeon: Everitt Amber, MD;  Location: [facility];  Service: Gynecology;  Laterality: N/A;  . VULVECTOMY N/A 08/29/2014   Procedure: WIDE LOCAL EXCISION OF VULVA;  Surgeon: Everitt Amber, MD;  Location: [place];  Service: Gynecology;  Laterality: N/A;  . VULVECTOMY  2013  . VULVECTOMY N/A 05/04/2015   Procedure: WIDE LOCAL EXCISION LEFT  VULVAR WITH BIOPSY OF RIGHT VULVA;  Surgeon: Everitt Amber, MD;  Location: [place];  Service: Gynecology;  Laterality: N/A;  . VULVECTOMY Left 01/22/2016   Procedure: PARTIAL SIMPLE VULVECTOMY;  Surgeon: Everitt Amber, MD;  Location: [facility];  Service: Gynecology;  Laterality: Left;  Marland Kitchen VULVECTOMY N/A 02/14/2016   Procedure: WIDE EXCISION VULVECTOMY;  Surgeon: Everitt Amber, MD;  Location: [facility];  Service: Gynecology;  Laterality: N/A;    Social History: Social History   Socioeconomic History  . Marital status: Single    Spouse name: Not on file  . Number of children: Not on file  . Years of education: Not on file  . Highest education level: Not on file  Occupational History  . Not on file  Social Needs   . Financial resource strain: Not on file  . Food insecurity    Worry: Not on file    Inability: Not on file  . Transportation needs    Medical: Not on file    Non-medical: Not on file  Tobacco Use  . Smoking status: Former Smoker    Packs/day: 1.00    Years: 5.00    Pack years: 5.00    Types: Cigarettes    Quit date: 10/05/1978    Years since quitting: 40.5  . Smokeless tobacco: Never Used  Substance and Sexual Activity  . Alcohol use: No    Alcohol/week: 1.0 - 2.0 standard drinks    Types: 1 - 2 Glasses  of wine per week    Comment: occas  . Drug use: No  . Sexual activity: Not on file  Lifestyle  . Physical activity    Days per week: Not on file    Minutes per session: Not on file  . Stress: Not on file  Relationships  . Social Herbalist on phone: Not on file    Gets together: Not on file    Attends religious service: Not on file    Active member of club or organization: Not on file    Attends meetings of clubs or organizations: Not on file    Relationship status: Not on file  . Intimate partner violence    Fear of current or ex partner: Not on file    Emotionally abused: Not on file    Physically abused: Not on file    Forced sexual activity: Not on file  Other Topics Concern  . Not on file  Social History Narrative   ** Merged History Encounter **        Family History: Family History  Problem Relation Age of Onset  . Aortic aneurysm Mother   . AAA (abdominal aortic aneurysm) Mother   . Diabetes Father   . Other Father        enlarged heart  . Arrhythmia Brother   . Heart attack Maternal Grandfather   . Arrhythmia Brother   . Thyroid cancer Daughter 60  . Breast cancer Cousin     Review of Systems: Review of Systems  Constitutional: Negative for chills and fever.  HENT: Negative for congestion and sore throat.   Respiratory: Negative for cough and shortness of breath.   Cardiovascular: Negative for chest pain and palpitations.   Gastrointestinal: Negative for abdominal pain, nausea and vomiting.  Musculoskeletal: Positive for back pain and neck pain. Negative for joint pain and myalgias.  Skin: Positive for itching and rash (small frequent rash near IMFs).    Physical Exam: Vital Signs BP (!) 159/107 (BP Location: Left Arm, Patient Position: Sitting, Cuff Size: Normal)   Pulse 81   Temp (!) 97.3 F (36.3 C) (Temporal)   Ht 5\' 3"  (1.6 m)   Wt 122 lb 3.2 oz (55.4 kg)   SpO2 99%   BMI 21.65 kg/m  Physical Exam Constitutional:      Appearance: Normal appearance. She is normal weight.  HENT:     Head: Normocephalic and atraumatic.  Eyes:     Extraocular Movements: Extraocular movements intact.  Neck:     Musculoskeletal: Normal range of motion.  Cardiovascular:     Rate and Rhythm: Normal rate and regular rhythm.     Pulses: Normal pulses.     Heart sounds: Normal heart sounds.  Pulmonary:     Effort: Pulmonary effort is normal.     Breath sounds: Normal breath sounds. No wheezing, rhonchi or rales.  Abdominal:     General: Bowel sounds are normal.     Palpations: Abdomen is soft.  Musculoskeletal: Normal range of motion.        General: No swelling.  Skin:    General: Skin is warm and dry.     Coloration: Skin is not pale.     Findings: Rash (small rash below IMFs (left > right)) present. No erythema.  Neurological:     General: No focal deficit present.     Mental Status: She is alert and oriented to person, place, and time.  Psychiatric:  Mood and Affect: Mood normal.        Behavior: Behavior normal.        Thought Content: Thought content normal.        Judgment: Judgment normal.     Assessment/Plan:  Stephanie Sanchez is scheduled for bilateral breast reduction with Dr. Marla Roe on 04/27/2019.  Risks, benefits, and alternatives of procedure discussed, questions answered and consent obtained.   Caprini DVT Risk Score > 5 (High Risk); recommendation for pharmacological and mechanical  prophylaxis. No Hx of DVT. Risk factors include hormone replacement therapy, age, surgery length, and vulvar paget's disease.  Last mammogram Dec 2019; currently scheduled at [facility] for bilateral mammogram Friday 04/15/2019.  Covid test scheduled.  Follow up appointments scheduled for 05/03/2019 and 05/17/2019. Rxs sent.  For pain take Ibuprofen 600 mg every 6 hours for the first 3 days. If pain not controlled add tylenol every 8 hours. If additional pain control needed add Narco at bedtime (may take twice a day if needed)  Electronically signed by: Threasa Heads, PA-C 04/12/2019 4:18 PM

## 2019-04-14 ENCOUNTER — Inpatient Hospital Stay: Payer: BC Managed Care – PPO | Admitting: Gynecologic Oncology

## 2019-04-15 ENCOUNTER — Other Ambulatory Visit: Payer: Self-pay

## 2019-04-15 ENCOUNTER — Ambulatory Visit
Admission: RE | Admit: 2019-04-15 | Discharge: 2019-04-15 | Disposition: A | Discharge status: Home / Self Care | Payer: BC Managed Care – PPO | Source: Ambulatory Visit | Attending: Plastic Surgery | Admitting: Plastic Surgery

## 2019-04-15 DIAGNOSIS — Z1231 Encounter for screening mammogram for malignant neoplasm of breast: Secondary | ICD-10-CM

## 2019-04-20 LAB — IFOBT (OCCULT BLOOD): IFOBT: NEGATIVE

## 2019-04-21 ENCOUNTER — Encounter (HOSPITAL_BASED_OUTPATIENT_CLINIC_OR_DEPARTMENT_OTHER): Payer: Self-pay | Admitting: Plastic Surgery

## 2019-04-21 ENCOUNTER — Ambulatory Visit (INDEPENDENT_AMBULATORY_CARE_PROVIDER_SITE_OTHER)
Admission: RE | Admit: 2019-04-21 | Discharge: 2019-04-21 | Disposition: A | Discharge status: Home / Self Care | Payer: Self-pay | Source: Ambulatory Visit | Attending: Cardiology | Admitting: Cardiology

## 2019-04-21 ENCOUNTER — Other Ambulatory Visit: Payer: Self-pay

## 2019-04-21 DIAGNOSIS — E785 Hyperlipidemia, unspecified: Secondary | ICD-10-CM

## 2019-04-23 ENCOUNTER — Other Ambulatory Visit (HOSPITAL_COMMUNITY)
Admission: RE | Admit: 2019-04-23 | Discharge: 2019-04-23 | Disposition: A | Discharge status: Home / Self Care | Payer: BC Managed Care – PPO | Source: Ambulatory Visit | Attending: Plastic Surgery | Admitting: Plastic Surgery

## 2019-04-23 DIAGNOSIS — Z20828 Contact with and (suspected) exposure to other viral communicable diseases: Secondary | ICD-10-CM | POA: Insufficient documentation

## 2019-04-23 DIAGNOSIS — Z01812 Encounter for preprocedural laboratory examination: Secondary | ICD-10-CM | POA: Insufficient documentation

## 2019-04-24 LAB — NOVEL CORONAVIRUS, NAA (HOSP ORDER, SEND-OUT TO REF LAB; TAT 18-24 HRS): SARS-CoV-2, NAA: NOT DETECTED

## 2019-04-25 NOTE — Telephone Encounter (Signed)
Will route to scheduling to get pt set up with Dr. Tamala Julian as a new patient.

## 2019-04-25 NOTE — Telephone Encounter (Signed)
ok 

## 2019-04-27 ENCOUNTER — Ambulatory Visit (HOSPITAL_BASED_OUTPATIENT_CLINIC_OR_DEPARTMENT_OTHER): Admission: RE | Admit: 2019-04-27 | Payer: BC Managed Care – PPO | Source: Home / Self Care | Admitting: Plastic Surgery

## 2019-04-27 SURGERY — MAMMOPLASTY, REDUCTION
Anesthesia: General | Site: Breast | Laterality: Bilateral

## 2019-05-03 ENCOUNTER — Encounter: Payer: BC Managed Care – PPO | Admitting: Plastic Surgery

## 2019-05-17 ENCOUNTER — Encounter: Payer: BC Managed Care – PPO | Admitting: Plastic Surgery

## 2019-06-29 NOTE — Progress Notes (Signed)
Cardiology Office Note:    Date:  06/30/2019   ID:  Stephanie Sanchez, DOB 1955/05/08, MRN [id number]  PCP:  Crist Infante, MD  Cardiologist:  No primary care provider on file.   Referring MD: Crist Infante, MD   Chief Complaint  Patient presents with  . Coronary Artery Disease  . Advice Only    Family history abdominal aneurysm.  Mildly elevated calcium score.    History of Present Illness:    Stephanie Sanchez is a 64 y.o. female with a hx of  chest pain, hyperlipidemia, hypertension headache, dyspnea, GERD, switching for cardiology f/u from Dr. Marlou Porch. Low risk nuclear 2015  She is asymptomatic and is not having any issues with chest pain currently.  She has family history of abdominal aortic aneurysm (mother and brother).  She has hyperlipidemia and hypertension.  Recently had a coronary calcium score less than 100.  An abdominal CT scan in 2015 demonstrated mild aortic atherosclerosis.  She walks greater than 5 miles several times per week without claudication, dyspnea, or chest pain.  She has been an avid walker for greater than 25 years.  No significant or abrupt change in exertional tolerance.  Past Medical History:  Diagnosis Date  . Arthritis   . Back pain 03/18/2019  . Chest pain 10/21/2013  . Dyspnea 10/21/2013  . 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 GERD (gastroesophageal reflux disease)   . History of cervical dysplasia   . History of colon polyps   . History of gastric ulcer    2013  . History of squamous cell carcinoma excision    left elbow  . Idiopathic scoliosis 10/21/2013  . Lichen simplex chronicus   . Neck pain 03/18/2019  . Paget's disease of vulva    first dx 2013  . Personal history of colonic polyps 10/21/2013  . PONV (postoperative nausea and vomiting)   . Psoriasis   . Scoliosis   . Symptomatic mammary hypertrophy 03/18/2019  . Vulval cellulitis 02/27/2016    Past Surgical History:  Procedure Laterality Date  . BLADDER SUSPENSION  2008   sling  . BREAST EXCISIONAL BIOPSY Left   . BREAST EXCISIONAL BIOPSY Right 2020  . BUNIONECTOMY  2008  . CARDIOVASCULAR STRESS TEST  09-30-2013   dr Marlou Porch   normal nuclear study/  no ischemia/  normal LV function and wall motion,  ef 61%  . CATARACT EXTRACTION W/ INTRAOCULAR LENS  IMPLANT, BILATERAL    . HAND SURGERY Right 11/02/12   hand reconstruction at [facility]  . RADIOACTIVE SEED GUIDED EXCISIONAL BREAST BIOPSY Right 11/09/2018   Procedure: RADIOACTIVE SEED GUIDED EXCISIONAL RIGHT BREAST BIOPSY AND RIGHT AREOLA BIOPSY;  Surgeon: Rolm Bookbinder, MD;  Location: [facility];  Service: General;  Laterality: Right;  . TONSILLECTOMY  72  age 22  . VAGINAL HYSTERECTOMY  31  age 73  . VULVA Milagros Loll BIOPSY N/A 08/29/2014   Procedure: Georgena Spurling;  Surgeon: Everitt Amber, MD;  Location: [facility];  Service: Gynecology;  Laterality: N/A;  . VULVECTOMY N/A 08/29/2014   Procedure: WIDE LOCAL EXCISION OF VULVA;  Surgeon: Everitt Amber, MD;  Location: [facility];  Service: Gynecology;  Laterality: N/A;  . VULVECTOMY  2013  . VULVECTOMY N/A 05/04/2015   Procedure: WIDE LOCAL EXCISION LEFT  VULVAR WITH BIOPSY OF RIGHT VULVA;  Surgeon: Everitt Amber, MD;  Location: [facility];  Service: Gynecology;  Laterality: N/A;  . VULVECTOMY Left 01/22/2016   Procedure: PARTIAL  SIMPLE VULVECTOMY;  Surgeon: Everitt Amber, MD;  Location: [facility];  Service: Gynecology;  Laterality: Left;  Marland Kitchen VULVECTOMY N/A 02/14/2016   Procedure: WIDE EXCISION VULVECTOMY;  Surgeon: Everitt Amber, MD;  Location: [facility];  Service: Gynecology;  Laterality: N/A;    Current Medications: Current Meds  Medication Sig  . calcium gluconate 500 MG tablet Take 1,000 tablets by mouth 2 (two) times daily.   . Cholecalciferol (VITAMIN D) 2000 UNITS tablet Take 2,000 Units by mouth 2 (two) times daily.   . Cyanocobalamin (VITAMIN B-12 PO) Take 1 tablet by  mouth daily.  Marland Kitchen estradiol (MINIVELLE) 0.05 MG/24HR patch twice a week. patch  . Garlic 123XX123 MG CAPS Take 2,000 mg by mouth 2 (two) times daily.   Marland Kitchen lactobacillus acidophilus (BACID) TABS tablet Take 1 tablet by mouth daily.   . magnesium gluconate (MAGONATE) 500 MG tablet Take 500 mg by mouth daily.  Marland Kitchen omega-3 acid ethyl esters (LOVAZA) 1 g capsule Take 1 g by mouth daily.  Marland Kitchen OVER THE COUNTER MEDICATION Take 1,000 mg by mouth 2 (two) times daily. Ester C 1000mg   . Specialty Vitamins Products (COLLAGEN ULTRA) CAPS Take by mouth.  Marland Kitchen Specialty Vitamins Products (ONE-A-DAY CHOLESTEROL PLUS PO) Take 1 tablet by mouth 3 (three) times daily.  . TURMERIC CURCUMIN PO Take 1,600 mg by mouth 2 (two) times daily.     Allergies:   Codeine   Social History   Socioeconomic History  . Marital status: Single    Spouse name: Not on file  . Number of children: Not on file  . Years of education: Not on file  . Highest education level: Not on file  Occupational History  . Not on file  Tobacco Use  . Smoking status: Former Smoker    Packs/day: 1.00    Years: 5.00    Pack years: 5.00    Types: Cigarettes    Quit date: 10/05/1978    Years since quitting: 40.7  . Smokeless tobacco: Never Used  Substance and Sexual Activity  . Alcohol use: Yes    Alcohol/week: 1.0 - 2.0 standard drinks    Types: 1 - 2 Glasses of wine per week    Comment: occas  . Drug use: No  . Sexual activity: Not on file  Other Topics Concern  . Not on file  Social History Narrative   ** Merged History Encounter **       Social Determinants of Health   Financial Resource Strain:   . Difficulty of Paying Living Expenses: Not on file  Food Insecurity:   . Worried About Charity fundraiser in the Last Year: Not on file  . Ran Out of Food in the Last Year: Not on file  Transportation Needs:   . Lack of Transportation (Medical): Not on file  . Lack of Transportation (Non-Medical): Not on file  Physical Activity:   . Days  of Exercise per Week: Not on file  . Minutes of Exercise per Session: Not on file  Stress:   . Feeling of Stress : Not on file  Social Connections:   . Frequency of Communication with Friends and Family: Not on file  . Frequency of Social Gatherings with Friends and Family: Not on file  . Attends Religious Services: Not on file  . Active Member of Clubs or Organizations: Not on file  . Attends Archivist Meetings: Not on file  . Marital Status: Not on file  Family History: The patient's family history includes AAA (abdominal aortic aneurysm) in her mother; Aortic aneurysm in her mother; Arrhythmia in her brother and brother; Breast cancer in her cousin; Diabetes in her father; Heart attack in her maternal grandfather; Other in her father; Thyroid cancer (age of onset: 49) in her daughter.  ROS:   Please see the history of present illness.    She has never had a cardiac event.  She is not smoke and has never smoked.  She is adverse to considering medical therapy and prefers naturopathic therapy.  All other systems reviewed and are negative.  EKGs/Labs/Other Studies Reviewed:    The following studies were reviewed today:  CORONARY CALCIUM SCORE 04/21/2019: IMPRESSION: Coronary calcium score of 10. This was 11 percentile for age and sex matched control.   NUCLEAR SCINTIGRAPHY 2015:  Low risk  Coronary Calcium Score 04/2019: IMPRESSION: Coronary calcium score of 10. This was 75 percentile for age and sex matched control  Most recent laboratory data:  Unfortunately today I do not have the recent A1c and lipid values.  She tells me that the total cholesterol was greater than 230.  For purposes of discussion we considered an LDL of 120 Asser value.  CT angiogram 2015: IMPRESSION: No evidence of pulmonary embolus.  No acute cardiopulmonary disease.  Abdominal/pelvic CT scan 2015: Mild atherosclerotic calcifications of the abdominal aorta are noted without  aneurysm formation   EKG:  EKG normal sinus rhythm, poor R wave progression, left axis deviation.  There is no prior tracing available for comparison.  Recent Labs: No results found for requested labs within last 8760 hours.  Recent Lipid Panel No results found for: CHOL, TRIG, HDL, CHOLHDL, VLDL, LDLCALC, LDLDIRECT  Physical Exam:    VS:  BP (!) 142/86   Pulse 76   Ht 5' 2.5" (1.588 m)   Wt 124 lb (56.2 kg)   SpO2 99%   BMI 22.32 kg/m     Wt Readings from Last 3 Encounters:  06/30/19 124 lb (56.2 kg)  04/12/19 122 lb 3.2 oz (55.4 kg)  03/18/19 121 lb (54.9 kg)     GEN: Appears younger than stated age and body habitus.. No acute distress HEENT: Normal NECK: No JVD. LYMPHATICS: No lymphadenopathy CARDIAC:  RRR without murmur, gallop, or edema. VASCULAR:  Normal Pulses. No bruits. RESPIRATORY:  Clear to auscultation without rales, wheezing or rhonchi  ABDOMEN: Soft, non-tender, non-distended, No pulsatile mass, MUSCULOSKELETAL: No deformity  SKIN: Warm and dry NEUROLOGIC:  Alert and oriented x 3 PSYCHIATRIC:  Normal affect   ASSESSMENT:    1. Hyperlipidemia, unspecified hyperlipidemia type   2. Coronary artery disease involving native coronary artery of native heart without angina pectoris   3. Elevated blood pressure reading   4. Shortness of breath   5. Other idiopathic scoliosis, cervicothoracic region   6. Gastroesophageal reflux disease with esophagitis without hemorrhage   7. Educated about COVID-19 virus infection    PLAN:    In order of problems listed above:  1. Will review laboratory from Dr. Joylene Draft.  Was not present during the office visit either by K PN or copied records sent by the practice.  Given risk factors noted including abdominal atherosclerosis and minimal blood present coronary plaque, I do feel perhaps low intensity statin therapy to achieve LDL less than 70 would be in order. 2. Primary prevention discussed as noted below 3. At her age and  with her exertional/exercise efforts blood pressure should be 130/80 or less. 4.  More short of breath now with physical activity than she was 3 to 5 years ago.  Not sure what this means.  She is still walking greater than 5 miles on the days that she exercises without difficulty. 5. Not discussed 6. Not discussed 7. COVID-19 vaccine has been received.  Social distancing is being practiced.  Overall education and awareness concerning primary/secondary risk prevention was discussed in detail: LDL less than 70, hemoglobin A1c less than 7, blood pressure target less than 130/80 mmHg, >150 minutes of moderate aerobic activity per week, avoidance of smoking, weight control (via diet and exercise), and continued surveillance/management of/for obstructive sleep apnea.  Overall her cardiovascular risk is in the 10 to 20% 10-year range.  I would recommend statin therapy to achieve LDL less than 70 and even a low-dose beta-blocker or ARB to control blood pressure better and give additional protection against developing aneurysmal disease in the aorta.  Clinical follow-up in 1 year to review progress with risk factor modification.   Medication Adjustments/Labs and Tests Ordered: Current medicines are reviewed at length with the patient today.  Concerns regarding medicines are outlined above.  Orders Placed This Encounter  Procedures  . EKG 12-Lead   No orders of the defined types were placed in this encounter.   Patient Instructions  Medication Instructions:  Your physician recommends that you continue on your current medications as directed. Please refer to the Current Medication list given to you today.  *If you need a refill on your cardiac medications before your next appointment, please call your pharmacy*  Lab Work: None If you have labs (blood work) drawn today and your tests are completely normal, you will receive your results only by: Marland Kitchen MyChart Message (if you have MyChart) OR . A paper  copy in the mail If you have any lab test that is abnormal or we need to change your treatment, we will call you to review the results.  Testing/Procedures: None  Follow-Up: At [facility], you and your health needs are our priority.  As part of our continuing mission to provide you with exceptional heart care, we have created designated Provider Care Teams.  These Care Teams include your primary Cardiologist (physician) and Advanced Practice Providers (APPs -  Physician Assistants and Nurse Practitioners) who all work together to provide you with the care you need, when you need it.  Your next appointment:   12 month(s)  The format for your next appointment:   In Person  Provider:   You may see Dr. Daneen Schick or one of the following Advanced Practice Providers on your designated Care Team:    Truitt Merle, NP  Cecilie Kicks, NP  Kathyrn Drown, NP   Other Instructions      Signed, Sinclair Grooms, MD  06/30/2019 11:05 AM    [place]

## 2019-06-30 ENCOUNTER — Ambulatory Visit: Payer: BC Managed Care – PPO | Admitting: Interventional Cardiology

## 2019-06-30 ENCOUNTER — Other Ambulatory Visit: Payer: Self-pay

## 2019-06-30 ENCOUNTER — Encounter: Payer: Self-pay | Admitting: Interventional Cardiology

## 2019-06-30 VITALS — BP 142/86 | HR 76 | Ht 62.5 in | Wt 124.0 lb

## 2019-06-30 DIAGNOSIS — R030 Elevated blood-pressure reading, without diagnosis of hypertension: Secondary | ICD-10-CM

## 2019-06-30 DIAGNOSIS — R0602 Shortness of breath: Secondary | ICD-10-CM

## 2019-06-30 DIAGNOSIS — I2510 Atherosclerotic heart disease of native coronary artery without angina pectoris: Secondary | ICD-10-CM

## 2019-06-30 DIAGNOSIS — E785 Hyperlipidemia, unspecified: Secondary | ICD-10-CM | POA: Diagnosis not present

## 2019-06-30 DIAGNOSIS — K2100 Gastro-esophageal reflux disease with esophagitis, without bleeding: Secondary | ICD-10-CM

## 2019-06-30 DIAGNOSIS — M4123 Other idiopathic scoliosis, cervicothoracic region: Secondary | ICD-10-CM

## 2019-06-30 DIAGNOSIS — Z7189 Other specified counseling: Secondary | ICD-10-CM

## 2019-06-30 NOTE — Patient Instructions (Signed)
Medication Instructions:  Your physician recommends that you continue on your current medications as directed. Please refer to the Current Medication list given to you today.  *If you need a refill on your cardiac medications before your next appointment, please call your pharmacy*  Lab Work: None If you have labs (blood work) drawn today and your tests are completely normal, you will receive your results only by: . MyChart Message (if you have MyChart) OR . A paper copy in the mail If you have any lab test that is abnormal or we need to change your treatment, we will call you to review the results.  Testing/Procedures: None  Follow-Up: At [facility], you and your health needs are our priority.  As part of our continuing mission to provide you with exceptional heart care, we have created designated Provider Care Teams.  These Care Teams include your primary Cardiologist (physician) and Advanced Practice Providers (APPs -  Physician Assistants and Nurse Practitioners) who all work together to provide you with the care you need, when you need it.  Your next appointment:   12 month(s)  The format for your next appointment:   In Person  Provider:   You may see Dr. Henry Smith or one of the following Advanced Practice Providers on your designated Care Team:    Lori Gerhardt, NP  Laura Ingold, NP  Jill McDaniel, NP   Other Instructions   

## 2019-07-05 ENCOUNTER — Telehealth: Payer: Self-pay | Admitting: Interventional Cardiology

## 2019-07-05 NOTE — Telephone Encounter (Signed)
Medical records requested from [facility]. 07/05/19 vlm

## 2019-07-08 ENCOUNTER — Telehealth: Payer: Self-pay | Admitting: *Deleted

## 2019-07-08 DIAGNOSIS — E785 Hyperlipidemia, unspecified: Secondary | ICD-10-CM

## 2019-07-08 MED ORDER — ROSUVASTATIN CALCIUM 10 MG PO TABS: 10.0000 mg | ORAL_TABLET | Freq: Every day | ORAL | 3 refills | Status: DC

## 2019-07-08 NOTE — Telephone Encounter (Signed)
Spoke with pt and went over recommendations per Dr. Tamala Julian.  Pt will come for labs on 08/29/2019.  Pt agreeable to plan.

## 2019-07-08 NOTE — Telephone Encounter (Signed)
-----   Message from Belva Crome, MD sent at 07/07/2019  4:41 PM EST ----- Let the patient know the cholesterol was reviewed and is too high. Bad is 158 and should be < 100 and ideally < 70. Start Rosuvastatin 10 mg daily and repeat Liver and Lipid in 6-8 weeks. A copy will be sent to Crist Infante, MD

## 2019-08-16 ENCOUNTER — Telehealth: Payer: Self-pay

## 2019-08-16 ENCOUNTER — Inpatient Hospital Stay: Payer: BC Managed Care – PPO | Attending: Gynecologic Oncology | Admitting: Gynecologic Oncology

## 2019-08-16 NOTE — Telephone Encounter (Signed)
Stephanie Sanchez did not realize that she was scheduled for an appointment today. She apologized for the inconvenience to Dr. Denman George. She is currently on the road and will call the office later to reschedule her appointment.

## 2019-08-16 NOTE — Telephone Encounter (Signed)
LM for Ms Zingale to call the office to r/smissed appointment today 08-16-19 at 1 pm with Dr. Denman George.

## 2019-08-29 ENCOUNTER — Other Ambulatory Visit: Payer: BC Managed Care – PPO

## 2020-01-12 ENCOUNTER — Inpatient Hospital Stay: Payer: Medicare Other | Attending: Gynecologic Oncology | Admitting: Gynecologic Oncology

## 2020-01-12 ENCOUNTER — Encounter: Payer: Self-pay | Admitting: Gynecologic Oncology

## 2020-01-12 ENCOUNTER — Other Ambulatory Visit: Payer: Self-pay

## 2020-01-12 VITALS — BP 153/80 | HR 77 | Temp 97.7°F | Resp 18 | Wt 121.4 lb

## 2020-01-12 DIAGNOSIS — K219 Gastro-esophageal reflux disease without esophagitis: Secondary | ICD-10-CM | POA: Diagnosis not present

## 2020-01-12 DIAGNOSIS — Z8249 Family history of ischemic heart disease and other diseases of the circulatory system: Secondary | ICD-10-CM | POA: Insufficient documentation

## 2020-01-12 DIAGNOSIS — Z90710 Acquired absence of both cervix and uterus: Secondary | ICD-10-CM | POA: Diagnosis not present

## 2020-01-12 DIAGNOSIS — Z803 Family history of malignant neoplasm of breast: Secondary | ICD-10-CM | POA: Insufficient documentation

## 2020-01-12 DIAGNOSIS — M419 Scoliosis, unspecified: Secondary | ICD-10-CM | POA: Diagnosis not present

## 2020-01-12 DIAGNOSIS — Z79899 Other long term (current) drug therapy: Secondary | ICD-10-CM | POA: Insufficient documentation

## 2020-01-12 DIAGNOSIS — C4499 Other specified malignant neoplasm of skin, unspecified: Secondary | ICD-10-CM | POA: Insufficient documentation

## 2020-01-12 DIAGNOSIS — Z833 Family history of diabetes mellitus: Secondary | ICD-10-CM | POA: Diagnosis not present

## 2020-01-12 DIAGNOSIS — C519 Malignant neoplasm of vulva, unspecified: Secondary | ICD-10-CM

## 2020-01-12 DIAGNOSIS — Z87891 Personal history of nicotine dependence: Secondary | ICD-10-CM | POA: Diagnosis not present

## 2020-01-12 NOTE — Progress Notes (Signed)
GYN ONC FOLLOW UP  Assessment:    64 y.o. 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   S/p simple partial left vulvectomy on 08/29/14 and 01/22/16.  S/p wide local excision of left labia on 02/14/16 for positive margins pagets with positive margin at 3 o'clock.  Will follow-up results of today's biopsy - if recurrent pagets, recommend wide local excision of the left vulva. If benign, recommend follow-up in 12 months.   Plan: Continue premarin three times a week to vagina to treat vulvovaginal atrophy. Recheck vulva in 12 months for Pagets surveillance.   HPI:  Stephanie Sanchez is a 31 y.o. year old initially seen in consultation on 07/02/12 for extramammary pagets.  She then underwent a simple partial left vulvectomy on 01/19/90 without complications.  Her postoperative course was uncomplicated.  Her final pathology revealed extramammary pagets of the left lateral labia majora with inked bilateral margins and 12 o'clock margin. The biopsies from the pruritic vulva on the posterior vagina and perinal regions revealed benign hyperplasia consistent with lichen simplex chronicus.  When I saw the patient in December, 2016 she had noticed the development of a "cyst" in the left posterior labia majora in the past month. It has decreased in size from a "kidney bean" size to now a "pea size". Tender. Increases in size when she wears pants.   On 05/04/15 she went to the OR for an excision of the posterior left labia majora which revealed a benign epidermal inclusion cyst. Random biopsies from the left and right mid labia minora and perineum were negative for LS and pagets.  Biopsy performed on 8/24/17by Dr Helane Rima  of left labia majora showed extramammary pagets.  Last colonoscopy 11/16  Surgery was performed on 01/22/16 (left vulvectomy) and pagets was positive on the 9 o'clock margin.  The patient requested re-exicision and was taken back on  02/14/16, Pathology showed a focal positive margin at 3 o'clock. She developed postop cellulitis and separation of the wound on week which resolved with antibiotics and expectant management.  When seen in February, 2018 she reported a new itch at anterior vulva (near clitoris). There was an area on the posterior left vaginal introitus that was acetowhite and was biopsied showing no pagets or dysplasia, only lichen sclerosis.  Interval Hx: She used clobetasol for four weeks in early 2018 and noted resolution of symptoms.  She noticed breast and nipple changes in December, 2019 with change in her estrogen patch. Mammogram in January, 2020 was normal.   She had reported some pruritis in the left introitus posteriorally.   Current Outpatient Medications on File Prior to Visit  Medication Sig Dispense Refill  . calcium gluconate 500 MG tablet Take 1,000 tablets by mouth 2 (two) times daily.     . Cholecalciferol (VITAMIN D) 2000 UNITS tablet Take 2,000 Units by mouth 2 (two) times daily.     . Cyanocobalamin (VITAMIN B-12 PO) Take 1 tablet by mouth daily.    Marland Kitchen estradiol (MINIVELLE) 0.05 MG/24HR patch twice a week. patch    . Garlic 5056 MG CAPS Take 2,000 mg by mouth 2 (two) times daily.     Marland Kitchen lactobacillus acidophilus (BACID) TABS tablet Take 1 tablet by mouth daily.     . magnesium gluconate (MAGONATE) 500 MG tablet Take 500 mg by mouth daily.    Marland Kitchen omega-3 acid ethyl esters (LOVAZA) 1 g capsule Take 1 g by mouth daily.    Marland Kitchen  OVER THE COUNTER MEDICATION Take 1,000 mg by mouth 2 (two) times daily. Ester C 1000mg     . Specialty Vitamins Products (COLLAGEN ULTRA) CAPS Take by mouth.    Marland Kitchen Specialty Vitamins Products (ONE-A-DAY CHOLESTEROL PLUS PO) Take 1 tablet by mouth 3 (three) times daily.    Marland Kitchen tretinoin (RETIN-A) 0.1 % cream Apply topically at bedtime.    . TURMERIC CURCUMIN PO Take 1,600 mg by mouth 2 (two) times daily.    . rosuvastatin (CRESTOR) 10 MG tablet Take 1 tablet (10 mg total) by  mouth daily. (Patient not taking: Reported on 01/12/2020) 90 tablet 3   No current facility-administered medications on file prior to visit.   Allergies  Allergen Reactions  . Codeine Itching, Nausea And Vomiting and Rash    "comes out like a burn/rash"   Past Medical History:  Diagnosis Date  . Arthritis   . Back pain 03/18/2019  . Chest pain 10/21/2013  . Dyspnea 10/21/2013  . 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 GERD (gastroesophageal reflux disease)   . History of cervical dysplasia   . History of colon polyps   . History of gastric ulcer    2013  . History of squamous cell carcinoma excision    left elbow  . Idiopathic scoliosis 10/21/2013  . Lichen simplex chronicus   . Neck pain 03/18/2019  . Paget's disease of vulva    first dx 2013  . Personal history of colonic polyps 10/21/2013  . PONV (postoperative nausea and vomiting)   . Psoriasis   . Scoliosis   . Symptomatic mammary hypertrophy 03/18/2019  . Vulval cellulitis 02/27/2016   Past Surgical History:  Procedure Laterality Date  . BLADDER SUSPENSION  2008   sling  . BREAST EXCISIONAL BIOPSY Left   . BREAST EXCISIONAL BIOPSY Right 2020  . BUNIONECTOMY  2008  . CARDIOVASCULAR STRESS TEST  09-30-2013   dr Marlou Porch   normal nuclear study/  no ischemia/  normal LV function and wall motion,  ef 61%  . CATARACT EXTRACTION W/ INTRAOCULAR LENS  IMPLANT, BILATERAL    . HAND SURGERY Right 11/02/12   hand reconstruction at [facility]  . RADIOACTIVE SEED GUIDED EXCISIONAL BREAST BIOPSY Right 11/09/2018   Procedure: RADIOACTIVE SEED GUIDED EXCISIONAL RIGHT BREAST BIOPSY AND RIGHT AREOLA BIOPSY;  Surgeon: Rolm Bookbinder, MD;  Location: [facility];  Service: General;  Laterality: Right;  . TONSILLECTOMY  51  age 89  . VAGINAL HYSTERECTOMY  52  age 3  . VULVA Milagros Loll BIOPSY N/A 08/29/2014   Procedure: Georgena Spurling;  Surgeon: Everitt Amber, MD;  Location: [facility];  Service: Gynecology;   Laterality: N/A;  . VULVECTOMY N/A 08/29/2014   Procedure: WIDE LOCAL EXCISION OF VULVA;  Surgeon: Everitt Amber, MD;  Location: [facility];  Service: Gynecology;  Laterality: N/A;  . VULVECTOMY  2013  . VULVECTOMY N/A 05/04/2015   Procedure: WIDE LOCAL EXCISION LEFT  VULVAR WITH BIOPSY OF RIGHT VULVA;  Surgeon: Everitt Amber, MD;  Location: [facility];  Service: Gynecology;  Laterality: N/A;  . VULVECTOMY Left 01/22/2016   Procedure: PARTIAL SIMPLE VULVECTOMY;  Surgeon: Everitt Amber, MD;  Location: [facility];  Service: Gynecology;  Laterality: Left;  Marland Kitchen VULVECTOMY N/A 02/14/2016   Procedure: WIDE EXCISION VULVECTOMY;  Surgeon: Everitt Amber, MD;  Location: [facility];  Service: Gynecology;  Laterality: N/A;   Family History  Problem Relation Age of Onset  . Aortic aneurysm Mother   .  AAA (abdominal aortic aneurysm) Mother   . Diabetes Father   . Other Father        enlarged heart  . Arrhythmia Brother   . Heart attack Maternal Grandfather   . Arrhythmia Brother   . Thyroid cancer Daughter 71  . Breast cancer Cousin    Social History   Socioeconomic History  . Marital status: Single    Spouse name: Not on file  . Number of children: Not on file  . Years of education: Not on file  . Highest education level: Not on file  Occupational History  . Not on file  Tobacco Use  . Smoking status: Former Smoker    Packs/day: 1.00    Years: 5.00    Pack years: 5.00    Types: Cigarettes    Quit date: 10/05/1978    Years since quitting: 41.2  . Smokeless tobacco: Never Used  Vaping Use  . Vaping Use: Never used  Substance and Sexual Activity  . Alcohol use: Yes    Alcohol/week: 1.0 - 2.0 standard drink    Types: 1 - 2 Glasses of wine per week    Comment: occas  . Drug use: No  . Sexual activity: Not on file  Other Topics Concern  . Not on file  Social History Narrative   ** Merged History Encounter **       Social  Determinants of Health   Financial Resource Strain:   . Difficulty of Paying Living Expenses: Not on file  Food Insecurity:   . Worried About Charity fundraiser in the Last Year: Not on file  . Ran Out of Food in the Last Year: Not on file  Transportation Needs:   . Lack of Transportation (Medical): Not on file  . Lack of Transportation (Non-Medical): Not on file  Physical Activity:   . Days of Exercise per Week: Not on file  . Minutes of Exercise per Session: Not on file  Stress:   . Feeling of Stress : Not on file  Social Connections:   . Frequency of Communication with Friends and Family: Not on file  . Frequency of Social Gatherings with Friends and Family: Not on file  . Attends Religious Services: Not on file  . Active Member of Clubs or Organizations: Not on file  . Attends Archivist Meetings: Not on file  . Marital Status: Not on file  Intimate Partner Violence:   . Fear of Current or Ex-Partner: Not on file  . Emotionally Abused: Not on file  . Physically Abused: Not on file  . Sexually Abused: Not on file    Review of systems: Constitutional:  She has no weight gain or weight loss. She has no fever or chills. Eyes: No blurred vision Ears, Nose, Mouth, Throat: No dizziness, headaches or changes in hearing. No mouth sores. Cardiovascular: No chest pain, palpitations or edema. Respiratory:  No shortness of breath, wheezing or cough Gastrointestinal: She has normal bowel movements without diarrhea or constipation. She denies any nausea or vomiting. She denies blood in her stool or heart burn. Genitourinary:  She denies pelvic pain, pelvic pressure or changes in her urinary function. She has no hematuria, dysuria, or incontinence. She has no irregular vaginal bleeding or vaginal discharge Musculoskeletal: Denies muscle weakness or joint pains.  Skin:  She has no skin changes, rashes or itching Neurological:  Denies dizziness or headaches. No neuropathy, no  numbness or tingling. Psychiatric:  She denies depression or anxiety. Hematologic/Lymphatic:  No easy bruising or bleeding   Physical Exam: Blood pressure (!) 153/80, pulse 77, temperature 97.7 F (36.5 C), temperature source Tympanic, resp. rate 18, weight 121 lb 6.4 oz (55.1 kg), SpO2 100 %. General: Well dressed, well nourished in no apparent distress.   HEENT:  Normocephalic and atraumatic, no lesions.  Extraocular muscles intact. Sclerae anicteric. Pupils equal, round, reactive. No mouth sores or ulcers. Thyroid is normal size, not nodular, midline. Skin:  No lesions or rashes. Breasts:  deferred Lungs:  deferred Cardiovascular:  deferred Abdomen:  deferred Genitourinary: Left vulvar no longer erythematous with no spreading on surrounding skin. No fluctuance or undrained fluid. No open wound or ulceration - completely healed. Very atrophic vulva. With application of acetic acid there 2cm strip of erythematous velvety skin anterior left labia majora (anterior to prior incision).  Extremities: No cyanosis, clubbing or edema.  No calf tenderness or erythema. No palpable cords. Psychiatric: Mood and affect are appropriate. Neurological: Awake, alert and oriented x 3. Sensation is intact, no neuropathy.  Musculoskeletal: No pain, normal strength and range of motion.  Procedure Note:  Preop Dx: vulvar lesion Postop Dx: same Procedure: vulvar biopsy Surgeon: Dorann Ou, MD EBL: scant Specimens: left anterior vulva Complications: none Procedure Details: the patient provided verbal consent, time out performed. Left vulva swabbed with betadine. Infiltrated with 1cc of 2% lidocaine. A 56mm punch biopsy was used to take a representative sample of the erythematous area. Hemostasis achieved with silver nitrate.   Thereasa Solo, MD

## 2020-01-12 NOTE — Patient Instructions (Signed)
Dr Denman George took a biopsy of the left vulva. The result should be back next week and her office will call with the result. If it shows pagets or dysplasia, she will recommend excision of the area. If it shows normal skin, she recommends follow-up with Dr Helane Rima in 6 months and Dr Denman George in 12 months.

## 2020-01-16 ENCOUNTER — Telehealth: Payer: Self-pay

## 2020-01-16 LAB — SURGICAL PATHOLOGY

## 2020-01-16 NOTE — Telephone Encounter (Signed)
Told Stephanie Sanchez that the biopsy showed  Paget's Disease. Dr. Denman George is recommending a wide local excision of the vulva.  Pt requested the week of 02-27-20. [name] gave her the date of 03-01-24. Stephanie Sanchez mentioned that Dr. Denman George mentioned at her last surgery that she would need a skin graft on the left vulvar if she required any more excisions. Pt stated that this would require a few days in the hospital to monitor for infection of graft site.  [name], NP will discuss this with Dr. Denman George and we will call her back with Dr. Serita Grit plan.

## 2020-01-17 ENCOUNTER — Telehealth: Payer: Self-pay | Admitting: *Deleted

## 2020-01-17 NOTE — Telephone Encounter (Signed)
Patient called and left a message to [place], [place] . Patient sated "I wanted Barbaraann Share to also ask Dr Denman George if the surgery can wait until January. If not I will do want ever needs to be done." Message given to [place], [place].

## 2020-01-17 NOTE — Telephone Encounter (Signed)
Told Ms Meske that Dr. Denman George said that she would not need a skin graft. Dr. Denman George is fine with waiting until January 2022 for surgery. Told Ms Percival to call at the end of November to schedule the surgery as the schedule for 2022  Is not out yet. Pt verbalized understanding.

## 2020-01-17 NOTE — Telephone Encounter (Signed)
Told Ms Leu that Dr. Denman George said that she would not need a skin graft. Dr. Denman George is fine with waiting until January 2022 for surgery. Told Ms Westra to call at the end of November to schedule the surgery as the schedule for 2022  Is not out yet. Pt verbalized understanding.

## 2020-01-23 ENCOUNTER — Telehealth: Payer: Self-pay | Admitting: *Deleted

## 2020-01-23 NOTE — Telephone Encounter (Signed)
Told Stephanie Sanchez that Dr. Denman George said that she was fine to wait several months to have the surgery.  If waiting is bothering her,she can schedule the for October.  Stephanie Sanchez stated that she will wait until the New Year.   She will call in October to get on the surgery schedule for January.

## 2020-01-23 NOTE — Telephone Encounter (Signed)
Returned the patient's call and patient stated "this thing has been nagging at me since I called and wanted my surgery moved to the winter. Now I'm wondering if I should wait. I just wanted to ask [name] if I was her mother what would she tell me to do." Explained that we are in clinic today and would call her back later today.

## 2020-03-02 ENCOUNTER — Other Ambulatory Visit: Payer: Self-pay | Admitting: Plastic Surgery

## 2020-03-02 ENCOUNTER — Telehealth: Payer: Self-pay

## 2020-03-02 DIAGNOSIS — Z1231 Encounter for screening mammogram for malignant neoplasm of breast: Secondary | ICD-10-CM

## 2020-03-02 NOTE — Telephone Encounter (Signed)
Requested Ms Heathcock call back to discuss surgery dates at the end of November as the schedule is not out yet for 2022 due to a provider out due to surgery. Ms Cashaw verbalized understanding.

## 2020-03-27 ENCOUNTER — Telehealth: Payer: Self-pay | Admitting: *Deleted

## 2020-03-27 NOTE — Telephone Encounter (Signed)
Stephanie Sanchez called back and stated that January 5,2022 works better for her surgery with her daughter. She would like this date instead.

## 2020-03-27 NOTE — Telephone Encounter (Signed)
Ms Timmons chose January 24,2022 for her surgery date. Will call her to set up an appointment to see Dr. Denman George prior to surgery if necessary. Pt verbalized understanding. Information given to [name], NP.

## 2020-03-27 NOTE — Telephone Encounter (Signed)
Patient called requesting a surgery date for January.

## 2020-03-28 ENCOUNTER — Other Ambulatory Visit: Payer: Self-pay | Admitting: Gynecologic Oncology

## 2020-03-28 ENCOUNTER — Telehealth: Payer: Self-pay

## 2020-03-28 DIAGNOSIS — C519 Malignant neoplasm of vulva, unspecified: Secondary | ICD-10-CM

## 2020-03-28 NOTE — Telephone Encounter (Signed)
Told Stephanie Sanchez that her surgery will be on 05-09-20 with Dr. Denman George. Dr. Denman George does not need to see her again prior to the surgery.  She will receive a call from Pre surgical testing ~ 10 days prior to surgery to set up pre surgery appointments including covid testing. Pt verbalized understanding.

## 2020-04-16 ENCOUNTER — Ambulatory Visit: Payer: BC Managed Care – PPO

## 2020-04-17 ENCOUNTER — Other Ambulatory Visit: Payer: Self-pay

## 2020-04-17 ENCOUNTER — Other Ambulatory Visit: Payer: Self-pay | Admitting: Internal Medicine

## 2020-04-17 ENCOUNTER — Ambulatory Visit
Admission: RE | Admit: 2020-04-17 | Discharge: 2020-04-17 | Disposition: A | Discharge status: Home / Self Care | Payer: BC Managed Care – PPO | Source: Ambulatory Visit | Attending: Plastic Surgery | Admitting: Plastic Surgery

## 2020-04-17 DIAGNOSIS — Z1231 Encounter for screening mammogram for malignant neoplasm of breast: Secondary | ICD-10-CM

## 2020-04-30 ENCOUNTER — Telehealth: Payer: Self-pay | Admitting: Interventional Cardiology

## 2020-04-30 NOTE — Telephone Encounter (Signed)
   Pt recently had endo-colonoscopy procedure and during the procedure she had branch bundle on the EKG. Her doctor said it may have been a fluke but the doctor advised her to call Dr. Katrinka Blazing to make him aware. Also she said the anesthesia used during her procedure is Robinul.

## 2020-04-30 NOTE — Telephone Encounter (Signed)
    Pt is returning call, she said she called her doctor and request a copy of the ECG to send to Dr. Katrinka Blazing

## 2020-04-30 NOTE — Telephone Encounter (Signed)
Returned call to patient and thanked her for requesting that ecg be faxed to our office for review by Dr. Katrinka Blazing. I answered her questions about bundle branch blocks and she states she has been concerned about recent dizziness. States PCP, Dr. Waynard Edwards is having her monitor BP at home and it is typically >130/80. Denies hypotension, states dizziness most often occurs when she is sitting. She walks 20 miles per week and feels that she drinks an adequate amount of water. I advised that I will forward message to Dr. Katrinka Blazing and his primary nurse and that once the ECG has been reviewed, our office will call her back. She verbalized understanding and agreement and thanked me for the call.

## 2020-04-30 NOTE — Telephone Encounter (Signed)
Left message for patient to call back. Additionally, asked her to call the facility or provider to have a copy of the ECG faxed to Dr. Tamala Julian at [phone].

## 2020-05-01 NOTE — Telephone Encounter (Signed)
Patient called back and said that her GI Doctor actually gave her the original copy of the EKG. The patient will bring it to the office Tomorrow for [place].

## 2020-05-03 ENCOUNTER — Other Ambulatory Visit: Payer: Self-pay

## 2020-05-03 ENCOUNTER — Encounter (HOSPITAL_BASED_OUTPATIENT_CLINIC_OR_DEPARTMENT_OTHER): Payer: Self-pay | Admitting: Gynecologic Oncology

## 2020-05-03 ENCOUNTER — Telehealth: Payer: Self-pay | Admitting: Interventional Cardiology

## 2020-05-03 NOTE — Progress Notes (Signed)
Spoke w/ via phone for pre-op interview--- PT Lab needs dos----  no             Lab results------ current ekg in epic/ chart COVID test ------ 05-07-2020 @ 1415 Arrive at ------- 0800 on 05-09-2020 NPO after MN NO Solid Food.  Clear liquids from MN until--- 0700 Medications to take morning of surgery ----- Prilosec Diabetic medication ----- n/a Patient Special Instructions ----- n/a Pre-Op special Istructions ----- n/a Patient verbalized understanding of instructions that were given at this phone interview. Patient denies shortness of breath, chest pain, fever, cough at this phone interview.

## 2020-05-03 NOTE — Telephone Encounter (Signed)
     I went in the pt's chart to see who she had talked to and left papers for. I talked to Constance Holster, she will call pt back after Dr Katrinka Blazing looks at the [facility]

## 2020-05-07 ENCOUNTER — Other Ambulatory Visit (HOSPITAL_COMMUNITY)
Admission: RE | Admit: 2020-05-07 | Discharge: 2020-05-07 | Disposition: A | Discharge status: Home / Self Care | Payer: Medicare Other | Source: Ambulatory Visit | Attending: Gynecologic Oncology | Admitting: Gynecologic Oncology

## 2020-05-07 DIAGNOSIS — Z01812 Encounter for preprocedural laboratory examination: Secondary | ICD-10-CM | POA: Diagnosis present

## 2020-05-07 DIAGNOSIS — Z20822 Contact with and (suspected) exposure to covid-19: Secondary | ICD-10-CM | POA: Diagnosis not present

## 2020-05-07 LAB — SARS CORONAVIRUS 2 (TAT 6-24 HRS): SARS Coronavirus 2: NEGATIVE

## 2020-05-08 NOTE — Telephone Encounter (Signed)
Follow Up:      Pt says her surgery is tomorrow and she have not heard back anything about her EKG that she left on last week. She does want to have surgery until she hears something please.

## 2020-05-08 NOTE — Telephone Encounter (Signed)
Chart review for Patient Swopes after colonoscopy and prior to Paget's Surgery. 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 Had RBBB with 1st HB and normal QT (reviewed photographs of telemetry strip) with anesthesia. RCRI -0.  From chart review would be low risk of perioperative myocardial infarction, pulmonary edema, ventricular fibrillation, cardiac arrest, with low moderate risk procedure.  If patient does not feel comfortable or if anesthesia concerns, can return to Dr. Katrinka Blazing or his APP team for pre-operative risk stratification.  Presently, no evidence that surgery ought be cancelled from chart review.  Christell Constant, MD

## 2020-05-08 NOTE — Telephone Encounter (Signed)
Spoke with pt regarding her situation. She states she dropped off the only and original copy of the EKG strip last week. She would like for it to be reviewed before her scheduled surgery tomorrow. I advised pt we were unable to find the strip, but will contact Dr. Michaelle Copas RN as to the whereabouts of the strip is. We will contact her back as soon as we find it.

## 2020-05-08 NOTE — Anesthesia Preprocedure Evaluation (Signed)
Anesthesia Evaluation  Patient identified by MRN, date of birth, ID band Patient awake    Reviewed: Allergy & Precautions, NPO status , Patient's Chart, lab work & pertinent test results  History of Anesthesia Complications (+) PONV and history of anesthetic complications  Airway Mallampati: II  TM Distance: >3 FB Neck ROM: Full    Dental no notable dental hx.    Pulmonary former smoker,    Pulmonary exam normal        Cardiovascular + CAD  Normal cardiovascular exam     Neuro/Psych negative neurological ROS  negative psych ROS   GI/Hepatic Neg liver ROS, GERD  Controlled,  Endo/Other  negative endocrine ROS  Renal/GU negative Renal ROS  negative genitourinary   Musculoskeletal  (+) Arthritis ,   Abdominal   Peds  Hematology negative hematology ROS (+)   Anesthesia Other Findings Day of surgery medications reviewed with patient.  Reproductive/Obstetrics Paget's disease of vulva                            Anesthesia Physical Anesthesia Plan  ASA: II  Anesthesia Plan: General   Post-op Pain Management:    Induction: Intravenous  PONV Risk Score and Plan: 4 or greater and Treatment may vary due to age or medical condition, Ondansetron, Dexamethasone, Midazolam, Scopolamine patch - Pre-op, Propofol infusion and TIVA  Airway Management Planned: LMA  Additional Equipment: None  Intra-op Plan:   Post-operative Plan: Extubation in OR  Informed Consent: I have reviewed the patients History and Physical, chart, labs and discussed the procedure including the risks, benefits and alternatives for the proposed anesthesia with the patient or authorized representative who has indicated his/her understanding and acceptance.     Dental advisory given  Plan Discussed with: CRNA  Anesthesia Plan Comments:        Anesthesia Quick Evaluation

## 2020-05-08 NOTE — Telephone Encounter (Signed)
Pt aware of recommendations and review of chart. She does feel more comfortable at this time. She had no additional needs at this time.

## 2020-05-08 NOTE — Telephone Encounter (Signed)
Provided DOD copy of rhythm strip. He requested the phone encounter be forwarded to him for documentation of recommendation.

## 2020-05-09 ENCOUNTER — Encounter (HOSPITAL_BASED_OUTPATIENT_CLINIC_OR_DEPARTMENT_OTHER)
Admission: RE | Disposition: A | Discharge status: Home / Self Care | Payer: Self-pay | Source: Home / Self Care | Attending: Gynecologic Oncology

## 2020-05-09 ENCOUNTER — Other Ambulatory Visit: Payer: Self-pay

## 2020-05-09 ENCOUNTER — Telehealth: Payer: Self-pay

## 2020-05-09 ENCOUNTER — Ambulatory Visit (HOSPITAL_BASED_OUTPATIENT_CLINIC_OR_DEPARTMENT_OTHER): Payer: Medicare Other | Admitting: Anesthesiology

## 2020-05-09 ENCOUNTER — Encounter (HOSPITAL_BASED_OUTPATIENT_CLINIC_OR_DEPARTMENT_OTHER): Payer: Self-pay | Admitting: Gynecologic Oncology

## 2020-05-09 ENCOUNTER — Ambulatory Visit (HOSPITAL_BASED_OUTPATIENT_CLINIC_OR_DEPARTMENT_OTHER)
Admission: RE | Admit: 2020-05-09 | Discharge: 2020-05-09 | Disposition: A | Discharge status: Home / Self Care | Payer: Medicare Other | Attending: Gynecologic Oncology | Admitting: Gynecologic Oncology

## 2020-05-09 DIAGNOSIS — L280 Lichen simplex chronicus: Secondary | ICD-10-CM | POA: Diagnosis not present

## 2020-05-09 DIAGNOSIS — Z79899 Other long term (current) drug therapy: Secondary | ICD-10-CM | POA: Diagnosis not present

## 2020-05-09 DIAGNOSIS — Z8711 Personal history of peptic ulcer disease: Secondary | ICD-10-CM | POA: Insufficient documentation

## 2020-05-09 DIAGNOSIS — C519 Malignant neoplasm of vulva, unspecified: Secondary | ICD-10-CM

## 2020-05-09 DIAGNOSIS — Z803 Family history of malignant neoplasm of breast: Secondary | ICD-10-CM | POA: Diagnosis not present

## 2020-05-09 DIAGNOSIS — Z885 Allergy status to narcotic agent status: Secondary | ICD-10-CM | POA: Insufficient documentation

## 2020-05-09 DIAGNOSIS — Z809 Family history of malignant neoplasm, unspecified: Secondary | ICD-10-CM | POA: Insufficient documentation

## 2020-05-09 DIAGNOSIS — Z86010 Personal history of colonic polyps: Secondary | ICD-10-CM | POA: Diagnosis not present

## 2020-05-09 DIAGNOSIS — Z87891 Personal history of nicotine dependence: Secondary | ICD-10-CM | POA: Insufficient documentation

## 2020-05-09 DIAGNOSIS — Z90710 Acquired absence of both cervix and uterus: Secondary | ICD-10-CM | POA: Insufficient documentation

## 2020-05-09 DIAGNOSIS — C4499 Other specified malignant neoplasm of skin, unspecified: Secondary | ICD-10-CM

## 2020-05-09 HISTORY — DX: Atherosclerotic heart disease of native coronary artery without angina pectoris: I25.10

## 2020-05-09 HISTORY — DX: Gastritis, unspecified, without bleeding: K29.70

## 2020-05-09 HISTORY — DX: Other intervertebral disc degeneration, lumbar region without mention of lumbar back pain or lower extremity pain: M51.369

## 2020-05-09 HISTORY — DX: Other intervertebral disc degeneration, lumbar region: M51.36

## 2020-05-09 HISTORY — PX: VULVECTOMY: SHX1086

## 2020-05-09 HISTORY — DX: Esophagitis, unspecified without bleeding: K20.90

## 2020-05-09 SURGERY — VULVECTOMY
Anesthesia: General | Site: Vagina

## 2020-05-09 MED ORDER — DEXAMETHASONE SODIUM PHOSPHATE 10 MG/ML IJ SOLN
INTRAMUSCULAR | Status: AC
  Filled 2020-05-09: qty 1

## 2020-05-09 MED ORDER — PROPOFOL 10 MG/ML IV BOLUS
INTRAVENOUS | Status: AC
  Filled 2020-05-09: qty 20

## 2020-05-09 MED ORDER — LIDOCAINE HCL (PF) 2 % IJ SOLN
INTRAMUSCULAR | Status: AC
  Filled 2020-05-09: qty 5

## 2020-05-09 MED ORDER — IBUPROFEN 600 MG PO TABS: 600.0000 mg | ORAL_TABLET | Freq: Four times a day (QID) | ORAL | 0 refills | Status: DC | PRN

## 2020-05-09 MED ORDER — LIDOCAINE 2% (20 MG/ML) 5 ML SYRINGE
INTRAMUSCULAR | Status: DC | PRN
  Administered 2020-05-09: 60 mg via INTRAVENOUS

## 2020-05-09 MED ORDER — PROPOFOL 500 MG/50ML IV EMUL
INTRAVENOUS | Status: DC | PRN
  Administered 2020-05-09: 150 ug/kg/min via INTRAVENOUS

## 2020-05-09 MED ORDER — ONDANSETRON HCL 4 MG/2ML IJ SOLN
INTRAMUSCULAR | Status: DC | PRN
  Administered 2020-05-09: 4 mg via INTRAVENOUS

## 2020-05-09 MED ORDER — ONDANSETRON 4 MG PO TBDP: 4.0000 mg | ORAL_TABLET | Freq: Three times a day (TID) | ORAL | 0 refills | Status: DC | PRN

## 2020-05-09 MED ORDER — TRAMADOL HCL 50 MG PO TABS: 50.0000 mg | ORAL_TABLET | Freq: Four times a day (QID) | ORAL | 0 refills | Status: DC | PRN

## 2020-05-09 MED ORDER — CEFAZOLIN SODIUM-DEXTROSE 2-4 GM/100ML-% IV SOLN
2.0000 g | INTRAVENOUS | Status: AC
  Administered 2020-05-09: 2 g via INTRAVENOUS

## 2020-05-09 MED ORDER — MIDAZOLAM HCL 2 MG/2ML IJ SOLN
INTRAMUSCULAR | Status: AC
  Filled 2020-05-09: qty 2

## 2020-05-09 MED ORDER — PROPOFOL 10 MG/ML IV BOLUS
INTRAVENOUS | Status: DC | PRN
  Administered 2020-05-09: 110 mg via INTRAVENOUS
  Administered 2020-05-09: 30 mg via INTRAVENOUS

## 2020-05-09 MED ORDER — BUPIVACAINE HCL 0.25 % IJ SOLN
INTRAMUSCULAR | Status: DC | PRN
  Administered 2020-05-09: 10 mL

## 2020-05-09 MED ORDER — FENTANYL CITRATE (PF) 100 MCG/2ML IJ SOLN
INTRAMUSCULAR | Status: AC
  Filled 2020-05-09: qty 2

## 2020-05-09 MED ORDER — KETOROLAC TROMETHAMINE 30 MG/ML IJ SOLN
INTRAMUSCULAR | Status: AC
  Filled 2020-05-09: qty 1

## 2020-05-09 MED ORDER — SCOPOLAMINE 1 MG/3DAYS TD PT72
MEDICATED_PATCH | TRANSDERMAL | Status: AC
  Filled 2020-05-09: qty 1

## 2020-05-09 MED ORDER — SENNOSIDES-DOCUSATE SODIUM 8.6-50 MG PO TABS: 2.0000 | ORAL_TABLET | Freq: Every day | ORAL | 0 refills | Status: DC

## 2020-05-09 MED ORDER — ACETAMINOPHEN 500 MG PO TABS
ORAL_TABLET | ORAL | Status: AC
  Filled 2020-05-09: qty 2

## 2020-05-09 MED ORDER — CEFAZOLIN SODIUM-DEXTROSE 2-4 GM/100ML-% IV SOLN
INTRAVENOUS | Status: AC
  Filled 2020-05-09: qty 100

## 2020-05-09 MED ORDER — ACETAMINOPHEN 500 MG PO TABS
1000.0000 mg | ORAL_TABLET | ORAL | Status: AC
  Administered 2020-05-09: 1000 mg via ORAL

## 2020-05-09 MED ORDER — EPHEDRINE 5 MG/ML INJ
INTRAVENOUS | Status: AC
  Filled 2020-05-09: qty 10

## 2020-05-09 MED ORDER — EPHEDRINE SULFATE 50 MG/ML IJ SOLN
INTRAMUSCULAR | Status: DC | PRN
  Administered 2020-05-09: 20 mg via INTRAVENOUS

## 2020-05-09 MED ORDER — GABAPENTIN 100 MG PO CAPS
ORAL_CAPSULE | ORAL | Status: AC
  Filled 2020-05-09: qty 2

## 2020-05-09 MED ORDER — GABAPENTIN 100 MG PO CAPS
200.0000 mg | ORAL_CAPSULE | ORAL | Status: AC
  Administered 2020-05-09: 200 mg via ORAL

## 2020-05-09 MED ORDER — LACTATED RINGERS IV SOLN: INTRAVENOUS | Status: DC

## 2020-05-09 MED ORDER — HYDROMORPHONE HCL 2 MG PO TABS: 2.0000 mg | ORAL_TABLET | ORAL | 0 refills | Status: DC | PRN

## 2020-05-09 MED ORDER — SCOPOLAMINE 1 MG/3DAYS TD PT72
1.0000 | MEDICATED_PATCH | TRANSDERMAL | Status: DC
  Administered 2020-05-09: 1.5 mg via TRANSDERMAL

## 2020-05-09 MED ORDER — PROPOFOL 500 MG/50ML IV EMUL
INTRAVENOUS | Status: AC
  Filled 2020-05-09: qty 50

## 2020-05-09 MED ORDER — BUPIVACAINE LIPOSOME 1.3 % IJ SUSP
INTRAMUSCULAR | Status: DC | PRN
  Administered 2020-05-09: 20 mL

## 2020-05-09 MED ORDER — OXYCODONE HCL 5 MG PO TABS: 5.0000 mg | ORAL_TABLET | ORAL | 0 refills | Status: DC | PRN

## 2020-05-09 MED ORDER — DEXAMETHASONE SODIUM PHOSPHATE 4 MG/ML IJ SOLN
INTRAMUSCULAR | Status: DC | PRN
  Administered 2020-05-09: 10 mg via INTRAVENOUS

## 2020-05-09 MED ORDER — KETOROLAC TROMETHAMINE 30 MG/ML IJ SOLN
INTRAMUSCULAR | Status: DC | PRN
  Administered 2020-05-09: 30 mg via INTRAVENOUS

## 2020-05-09 MED ORDER — ACETIC ACID 5 % SOLN
Status: DC | PRN
  Administered 2020-05-09: 1 via TOPICAL

## 2020-05-09 MED ORDER — MIDAZOLAM HCL 5 MG/5ML IJ SOLN
INTRAMUSCULAR | Status: DC | PRN
  Administered 2020-05-09: 2 mg via INTRAVENOUS

## 2020-05-09 MED ORDER — ONDANSETRON HCL 4 MG/2ML IJ SOLN
INTRAMUSCULAR | Status: AC
  Filled 2020-05-09: qty 2

## 2020-05-09 MED ORDER — FENTANYL CITRATE (PF) 100 MCG/2ML IJ SOLN
INTRAMUSCULAR | Status: DC | PRN
  Administered 2020-05-09: 50 ug via INTRAVENOUS
  Administered 2020-05-09 (×2): 25 ug via INTRAVENOUS

## 2020-05-09 SURGICAL SUPPLY — 31 items
BLADE CLIPPER SENSICLIP SURGIC (BLADE) IMPLANT
BLADE SURG 15 STRL LF DISP TIS (BLADE) ×1 IMPLANT
BLADE SURG 15 STRL SS (BLADE) ×2
CANISTER SUCT 3000ML PPV (MISCELLANEOUS) IMPLANT
CATH ROBINSON RED A/P 14FR (CATHETERS) ×2 IMPLANT
COVER WAND RF STERILE (DRAPES) ×2 IMPLANT
DECANTER SPIKE VIAL GLASS SM (MISCELLANEOUS) ×2 IMPLANT
GAUZE 4X4 16PLY RFD (DISPOSABLE) ×2 IMPLANT
GLOVE BIO SURGEON STRL SZ 6 (GLOVE) ×4 IMPLANT
GLOVE BIO SURGEON STRL SZ7 (GLOVE) ×2 IMPLANT
GLOVE BIOGEL PI IND STRL 7.0 (GLOVE) ×1 IMPLANT
GLOVE BIOGEL PI INDICATOR 7.0 (GLOVE) ×1
GLOVE SURG SS PI 7.0 STRL IVOR (GLOVE) ×2 IMPLANT
GOWN STRL REUS W/TWL LRG LVL3 (GOWN DISPOSABLE) ×4 IMPLANT
KIT TURNOVER CYSTO (KITS) ×2 IMPLANT
MANIFOLD NEPTUNE II (INSTRUMENTS) ×2 IMPLANT
NEEDLE HYPO 25X1 1.5 SAFETY (NEEDLE) ×2 IMPLANT
NS IRRIG 500ML POUR BTL (IV SOLUTION) ×2 IMPLANT
PACK PERINEAL COLD (PAD) ×2 IMPLANT
PACK VAGINAL WOMENS (CUSTOM PROCEDURE TRAY) ×2 IMPLANT
SUT VIC AB 0 SH 27 (SUTURE) ×2 IMPLANT
SUT VIC AB 2-0 CT2 27 (SUTURE) IMPLANT
SUT VIC AB 2-0 SH 27 (SUTURE)
SUT VIC AB 2-0 SH 27XBRD (SUTURE) IMPLANT
SUT VIC AB 3-0 SH 27 (SUTURE) ×6
SUT VIC AB 3-0 SH 27X BRD (SUTURE) ×3 IMPLANT
SUT VIC AB 4-0 PS2 18 (SUTURE) ×10 IMPLANT
SWAB OB GYN 8IN STERILE 2PK (MISCELLANEOUS) IMPLANT
SYR BULB IRRIG 60ML STRL (SYRINGE) ×2 IMPLANT
TOWEL OR 17X26 10 PK STRL BLUE (TOWEL DISPOSABLE) ×2 IMPLANT
WATER STERILE IRR 500ML POUR (IV SOLUTION) IMPLANT

## 2020-05-09 NOTE — Op Note (Addendum)
OPERATIVE NOTE  PATIENT: [name] DATE: 05/10/19   Preop Diagnosis: extramammary pagets disease of the vulva  Postoperative Diagnosis: same  Surgery: Partial simple left vulvectomy  Surgeons:  Quinn Axe, MD Assistant: none  Anesthesia: General   Estimated blood loss: <10 ml  IVF:    Urine output: 10 ml   Complications: None   Pathology: 1/ left periclitoral vulva with marking stitch at 12 o'clock 2/ left labia majora with marking stitch at 12 o'clock anterior  Operative findings: velvety erythematous changes across the hair bearing area of the left labia majora and the periclitoral left tissues.  Procedure: The patient was identified in the preoperative holding area. Informed consent was signed on the chart. Patient was seen history was reviewed and exam was performed.   The patient was then taken to the operating room and placed in the supine position with SCD hose on. General anesthesia was then induced without difficulty. She was then placed in the dorsolithotomy position. The perineum was prepped with Betadine. The vagina was prepped with Betadine. The patient was then draped after the prep was dried. A Foley catheter was inserted into the bladder under sterile conditions.  Timeout was performed the patient, procedure, antibiotic, allergy, and length of procedure. 5% acetic acid solution was applied to the perineum. The vulvar tissues were inspected for areas of acetowhite changes or leukoplakia. The lesions were identified and the marking pen was used to circumscribe the areas with appropriate surgical margins. The subcuticular tissues were infiltrated with marcaine mixed with exparel. The 15 blade scalpel was used to make an incision through the skin circumferentially as marked. The skin elipse was grasped and was separated from the underlying deep dermal tissues with the bovie device. After the specimens had been completely resected, they were  oriented and marked at 12 o'clock with a 0-vicryl suture. The bovie was used to obtain hemostasis at the surgical beds. The subcutaneous tissues were irrigated and made hemostatic.   The deep dermal layer was approximated with 3-0vicryl mattress sutures to bring the skin edges into approximation and off tension. The wounds were closed following langher's lines. The cutaneous layer was closed with interrupted 4-0 vicryl stitches and mattress sutures to ensure a tension free and hemostatic closure. The perineum was again irrigated. The foley was removed.  Additional exparel and marcaine mix were infiltrated into a left pudendal block and into the deeper tissues surrounding the incisions.  All instrument, suture, laparotomy, Ray-Tec, and needle counts were correct x2. The patient tolerated the procedure well and was taken recovery room in stable condition. This is Adolphus Birchwood dictating an operative note on Stephanie Sanchez.

## 2020-05-09 NOTE — H&P (Signed)
GYN ONC FOLLOW UP  Assessment:    65 y.o. 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  S/p simple partial left vulvectomy on 08/29/14 and 01/22/16.  S/p wide local excision of left labia on 02/14/16 for positive margins pagets with positive margin at 3 o'clock.  Recurrent pagets on the left labia majora, recommend wide local excision of the left vulva.   Plan: Continue premarin three times a week to vagina to treat vulvovaginal atrophy. Recheck vulva in 12 months for Pagets surveillance.   HPI:  Stephanie Sanchez is a 29 y.o. year old initially seen in consultation on 07/02/12 for extramammary pagets.  She then underwent a simple partial left vulvectomy on 08/29/14 without complications.  Her postoperative course was uncomplicated.  Her final pathology revealed extramammary pagets of the left lateral labia majora with inked bilateral margins and 12 o'clock margin. The biopsies from the pruritic vulva on the posterior vagina and perinal regions revealed benign hyperplasia consistent with lichen simplex chronicus.  When I saw the patient in December, 2016 she had noticed the development of a "cyst" in the left posterior labia majora in the past month. It has decreased in size from a "kidney bean" size to now a "pea size". Tender. Increases in size when she wears pants.   On 05/04/15 she went to the OR for an excision of the posterior left labia majora which revealed a benign epidermal inclusion cyst. Random biopsies from the left and right mid labia minora and perineum were negative for LS and pagets.  Biopsy performed on 8/24/17by Dr Vincente Poli  of left labia majora showed extramammary pagets.  Last colonoscopy 11/16  Surgery was performed on 01/22/16 (left vulvectomy) and pagets was positive on the 9 o'clock margin.  The patient requested re-exicision and was taken back on 02/14/16, Pathology showed a focal positive margin at 3 o'clock. She  developed postop cellulitis and separation of the wound on week which resolved with antibiotics and expectant management.  When seen in February, 2018 she reported a new itch at anterior vulva (near clitoris). There was an area on the posterior left vaginal introitus that was acetowhite and was biopsied showing no pagets or dysplasia, only lichen sclerosis.  Interval Hx: She used clobetasol for four weeks in early 2018 and noted resolution of symptoms.  She noticed breast and nipple changes in December, 2019 with change in her estrogen patch. Mammogram in January, 2020 was normal.   She returned for scheduled follow-up on 01/12/20 and had reported some pruritis in the left introitus posteriorally. On inspection there was a velvety erythematous strip of skin change on the left labia majora.   No current facility-administered medications on file prior to encounter.   Current Outpatient Medications on File Prior to Encounter  Medication Sig Dispense Refill  . calcium gluconate 500 MG tablet Take 1,000 tablets by mouth 2 (two) times daily.     . Calcium-Magnesium-Zinc (CAL-MAG-ZINC PO) Take by mouth daily.    . Cholecalciferol (VITAMIN D) 2000 UNITS tablet Take 2,000 Units by mouth daily.    . Coenzyme Q10 (COQ-10) 100 MG CAPS Take by mouth at bedtime.    . Cyanocobalamin (VITAMIN B-12 PO) Take 1 tablet by mouth daily.    Marland Kitchen estradiol (VIVELLE-DOT) 0.05 MG/24HR patch Place 1 patch onto the skin 2 (two) times a week.    . Garlic 1000 MG CAPS Take 2,000 mg by mouth 3 (three) times daily.    Marland Kitchen  lactobacillus acidophilus (BACID) TABS tablet Take 1 tablet by mouth daily.     . magnesium gluconate (MAGONATE) 500 MG tablet Take 500 mg by mouth as needed (cramps).    Marland Kitchen omega-3 acid ethyl esters (LOVAZA) 1 g capsule Take 1 g by mouth daily.    Marland Kitchen OVER THE COUNTER MEDICATION Take 1,000 mg by mouth daily. Ester C 1000mg     . rosuvastatin (CRESTOR) 10 MG tablet Take 1 tablet (10 mg total) by mouth daily.  (Patient taking differently: Take 10 mg by mouth at bedtime.) 90 tablet 3  . Specialty Vitamins Products (COLLAGEN ULTRA) CAPS Take by mouth daily.    Marland Kitchen tretinoin (RETIN-A) 0.1 % cream Apply topically at bedtime.    . TURMERIC CURCUMIN PO Take 1,600 mg by mouth 3 (three) times daily.     Allergies  Allergen Reactions  . Codeine Itching, Nausea And Vomiting and Rash    "comes out like a burn/rash"   Past Medical History:  Diagnosis Date  . Arthritis   . Coronary artery disease involving native coronary artery    cardiologist--- dr h. Tamala Julian--- CT coronary 04-21-2019 calcium score 10;  nuclear study 09-30-2013  normal w/ ef 61%  . DDD (degenerative disc disease), lumbar   . Esophagitis    EGD 04-30-2020  . Gastritis    EGD 04-30-2020  . GERD (gastroesophageal reflux disease)   . History of cervical dysplasia   . History of colon polyps   . History of gastric ulcer    2013  . History of squamous cell carcinoma excision    left elbow  . Lichen simplex chronicus   . 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[place])    first dx 2013;  s/p  WLE and Vulvectomy's  . PONV (postoperative nausea and vomiting)   . Psoriasis   . Scoliosis    cervicothoracic region   Past Surgical History:  Procedure Laterality Date  . BLADDER SUSPENSION  2008   sling  . BREAST EXCISIONAL BIOPSY Left   . BUNIONECTOMY  2008  . CARDIOVASCULAR STRESS TEST  09-30-2013   dr Marlou Porch   normal nuclear study/  no ischemia/  normal LV function and wall motion,  ef 61%  . CATARACT EXTRACTION W/ INTRAOCULAR LENS  IMPLANT, BILATERAL  2016  . COLONOSCOPY WITH ESOPHAGOGASTRODUODENOSCOPY (EGD)  04-30-2020  [facility] in [place]  . HAND SURGERY Right 11/02/12   [facility]  . RADIOACTIVE SEED GUIDED EXCISIONAL BREAST BIOPSY Right 11/09/2018   Procedure: RADIOACTIVE SEED GUIDED EXCISIONAL RIGHT BREAST BIOPSY AND RIGHT AREOLA BIOPSY;  Surgeon: Rolm Bookbinder, MD;  Location: [place];  Service: General;   Laterality: Right;  . TONSILLECTOMY  65  age 33  . VAGINAL HYSTERECTOMY  32  age 40  . VULVA Milagros Loll BIOPSY N/A 08/29/2014   Procedure: Georgena Spurling;  Surgeon: Everitt Amber, MD;  Location: [facility];  Service: Gynecology;  Laterality: N/A;  . VULVECTOMY N/A 08/29/2014   Procedure: WIDE LOCAL EXCISION OF VULVA;  Surgeon: Everitt Amber, MD;  Location: [place];  Service: Gynecology;  Laterality: N/A;  . VULVECTOMY N/A 05/04/2015   Procedure: WIDE LOCAL EXCISION LEFT  VULVAR WITH BIOPSY OF RIGHT VULVA;  Surgeon: Everitt Amber, MD;  Location: [place];  Service: Gynecology;  Laterality: N/A;  . VULVECTOMY Left 01/22/2016   Procedure: PARTIAL SIMPLE VULVECTOMY;  Surgeon: Everitt Amber, MD;  Location: [facility];  Service: Gynecology;  Laterality: Left;  Marland Kitchen VULVECTOMY N/A  02/14/2016   Procedure: WIDE EXCISION VULVECTOMY;  Surgeon: Everitt Amber, MD;  Location[facility];  Service: Gynecology;  Laterality: N/A;   Family History  Problem Relation Age of Onset  . Aortic aneurysm Mother   . AAA (abdominal aortic aneurysm) Mother   . Diabetes Father   . Other Father        enlarged heart  . Arrhythmia Brother   . Heart attack Maternal Grandfather   . Arrhythmia Brother   . Thyroid cancer Daughter 84  . Breast cancer Cousin    Social History   Socioeconomic History  . Marital status: Single    Spouse name: Not on file  . Number of children: Not on file  . Years of education: Not on file  . Highest education level: Not on file  Occupational History  . Not on file  Tobacco Use  . Smoking status: Former Smoker    Packs/day: 1.00    Years: 5.00    Pack years: 5.00    Types: Cigarettes    Quit date: 10/05/1978    Years since quitting: 41.6  . Smokeless tobacco: Never Used  Vaping Use  . Vaping Use: Never used  Substance and Sexual Activity  . Alcohol use: Yes    Comment: occas  . Drug use: No  . Sexual  activity: Not on file  Other Topics Concern  . Not on file  Social History Narrative   ** Merged History Encounter **       Social Determinants of Health   Financial Resource Strain: Not on file  Food Insecurity: Not on file  Transportation Needs: Not on file  Physical Activity: Not on file  Stress: Not on file  Social Connections: Not on file  Intimate Partner Violence: Not on file    Review of systems: Constitutional:  She has no weight gain or weight loss. She has no fever or chills. Eyes: No blurred vision Ears, Nose, Mouth, Throat: No dizziness, headaches or changes in hearing. No mouth sores. Cardiovascular: No chest pain, palpitations or edema. Respiratory:  No shortness of breath, wheezing or cough Gastrointestinal: She has normal bowel movements without diarrhea or constipation. She denies any nausea or vomiting. She denies blood in her stool or heart burn. Genitourinary:  She denies pelvic pain, pelvic pressure or changes in her urinary function. She has no hematuria, dysuria, or incontinence. She has no irregular vaginal bleeding or vaginal discharge Musculoskeletal: Denies muscle weakness or joint pains.  Skin:  She has no skin changes, rashes or itching Neurological:  Denies dizziness or headaches. No neuropathy, no numbness or tingling. Psychiatric:  She denies depression or anxiety. Hematologic/Lymphatic:   No easy bruising or bleeding   Physical Exam: Height 5' 2.5" (1.588 m), weight 119 lb (54 kg). General: Well dressed, well nourished in no apparent distress.   HEENT:  Normocephalic and atraumatic, no lesions.  Extraocular muscles intact. Sclerae anicteric. Pupils equal, round, reactive. No mouth sores or ulcers. Thyroid is normal size, not nodular, midline. Skin:  No lesions or rashes. Breasts:  deferred Lungs:  deferred Cardiovascular:  deferred Abdomen:  deferred Genitourinary: Left vulvar no longer erythematous with no spreading on surrounding skin. No  fluctuance or undrained fluid. No open wound or ulceration - completely healed. Very atrophic vulva. With application of acetic acid there 2cm strip of erythematous velvety skin anterior left labia majora (anterior to prior incision).  Extremities: No cyanosis, clubbing or edema.  No calf tenderness or erythema. No palpable  cords. Psychiatric: Mood and affect are appropriate. Neurological: Awake, alert and oriented x 3. Sensation is intact, no neuropathy.  Musculoskeletal: No pain, normal strength and range of motion.   Thereasa Solo, MD

## 2020-05-09 NOTE — Anesthesia Postprocedure Evaluation (Signed)
Anesthesia Post Note  Patient: Stephanie Sanchez  Procedure(s) Performed: WIDE LOCAL EXCISION VULVECTOMY (N/A Vagina )     Patient location during evaluation: PACU Anesthesia Type: General Level of consciousness: awake and alert and oriented Pain management: pain level controlled Vital Signs Assessment: post-procedure vital signs reviewed and stable Respiratory status: spontaneous breathing, nonlabored ventilation and respiratory function stable Cardiovascular status: blood pressure returned to baseline Postop Assessment: no apparent nausea or vomiting Anesthetic complications: no   No complications documented.  Last Vitals:  Vitals:   05/09/20 0827 05/09/20 1108  BP: (!) 151/84 135/69  Pulse: 70 86  Resp: 14 14  Temp: (!) 36.1 C (!) 36.4 C  SpO2: 100% 100%    Last Pain:  Vitals:   05/09/20 1108  TempSrc:   PainSc: 0-No pain                 Kaylyn Layer

## 2020-05-09 NOTE — Transfer of Care (Signed)
Immediate Anesthesia Transfer of Care Note  Patient: Stephanie Sanchez  Procedure(s) Performed: Procedure(s) (LRB): WIDE LOCAL EXCISION VULVECTOMY (N/A)  Patient Location: PACU  Anesthesia Type: General  Level of Consciousness: awake, sedated, patient cooperative and responds to stimulation  Airway & Oxygen Therapy: Patient Spontanous Breathing and Patient connected to Violet 02 and soft FM   Post-op Assessment: Report given to PACU RN, Post -op Vital signs reviewed and stable and Patient moving all extremities  Post vital signs: Reviewed and stable  Complications: No apparent anesthesia complications

## 2020-05-09 NOTE — Discharge Instructions (Addendum)
Vulvectomy, Care After  This sheet gives you information about how to care for yourself after your procedure. Your health care provider may also give you more specific instructions. If you have problems or questions, contact your health care provider. What can I expect after the procedure? After the procedure, it is common to have:  Vaginal pain.  Vaginal numbness.  Vaginal swelling.  Bloody vaginal discharge. Follow these instructions at home: Activity   Rest as told by your health care provider.  Do not lift, push, or pull more than 5 lb (2.3 kg), or the limit that you are told, until your health care provider says that it is safe.  Avoid activities that take a lot of effort for as long as told by your health care provider. This includes any exercise.  Raise (elevate) your legs while sitting or lying down.  Avoid standing or sitting in one place for long periods of time.  Do not cross your legs, especially when sitting, until your health care provider approves.  Return to your normal activities as told by your health care provider. Ask your health care provider what activities are safe for you. Bathing   Do not take baths, swim, or use a hot tub until your health care provider approves. Ask your health care provider if you can take showers. You may only be allowed to take sponge baths.  After passing urine or a bowel movement, wipe yourself from front to back and clean your vaginal area using a spray bottle.  If told by your health care provider, take a sitz bath to help with discomfort. This is a warm water bath you take while sitting down. ? Do this 3-4 times a day, or as often as told by your health care provider. ? The water should only come up to your hips and cover your buttocks. ? You may pat the area dry with a soft, clean towel. ? If needed, you may then gently dry the area with a hair dryer on a cool setting for 5-10 minutes. An enclosed box fan may also be used  to gently dry the area. Incision care   Follow instructions from your health care provider about how to take care of your incision areas. Make sure you: ? Wash your hands with soap and water before and after you change your bandages (dressing). If soap and water are not available, use hand sanitizer. ? Change your dressing as told by your health care provider. ? Leave stitches (sutures), skin glue, adhesive strips, or surgical clips in place. These skin closures may need to stay in place for 2 weeks or longer. If adhesive strip edges start to loosen and curl up, you may trim the loose edges. Do not remove adhesive strips completely unless your health care provider tells you to do that.  Check your incision areas every day for signs of infection. It may be helpful to use a handheld mirror to do this. Check for: ? Redness, swelling, or pain that has gotten worse. ? More fluid or blood. ? Warmth. ? Pus or a bad smell.  If you were sent home with a drain, take care of it as told by your health care provider. Lifestyle  Do not douche or use tampons until your health care provider approves.  Do not have sex until your health care provider approves. Tell your health care provider if you have pain or numbness when you return to sexual activity.  Wear cotton underwear and comfortable, loose-fitting  clothing. Medicines  Take over-the-counter and prescription medicines only as told by your health care provider.  Ask your health care provider if the medicine prescribed to you: ? Requires you to avoid driving or using heavy machinery. ? Can cause constipation. You may need to take these actions to prevent or treat constipation:  Take over-the-counter or prescription medicines.  Eat foods that are high in fiber, such as beans, whole grains, and fresh fruits and vegetables.  Limit foods that are high in fat and processed sugars, such as fried or sweet foods. General instructions  Do not drive  until your health care provider says that it is safe. For 1 week and when not taking the pain medication.  Drink enough fluid to keep your urine pale yellow.  Keep all follow-up visits as told by your health care provider. This is important. Contact a health care provider if:  You have any of these problems in the incision area: ? More redness, swelling, or pain around the incision. ? More fluid or blood coming from the incision. ? Pus or a bad smell coming from the incision. ? The incision feels warm to the touch. ? The incision breaks open.  You have a fever.  You have painful or bloody urination.  You feel nauseous or you vomit.  You have diarrhea.  You develop constipation.  You develop a rash.  You feel dizzy or light-headed.  You have pain that does not get better with medicine. Get help right away if you:  Faint.  Have leg or chest pain.  Have abdominal pain.  Have shortness of breath. Summary  After the procedure, it is common to have some pain, numbness, or swelling in the vaginal area. It is also common to have some bloody vaginal discharge.  Do not lift, push, or pull more than 5 lb (2.3 kg), or the limit that you are told, or engage in activities that take a lot of effort until your health care provider approves.  Follow instructions from your health care provider about how to take care of your incision.  Keep all follow-up visits as told by your health care provider. This is important. This information is not intended to replace advice given to you by your health care provider. Make sure you discuss any questions you have with your health care provider. Document Revised: 05/18/2018 Document Reviewed: 05/19/2018 Elsevier Patient Education  2020 [employer].    Information for Discharge Teaching: EXPAREL (bupivacaine liposome injectable suspension)   Your surgeon gave you EXPAREL(bupivacaine) in your surgical incision to help control your pain after  surgery.   EXPAREL is a local anesthetic that provides pain relief by numbing the tissue around the surgical site.  EXPAREL is designed to release pain medication over time and can control pain for up to 72 hours.  Depending on how you respond to EXPAREL, you may require less pain medication during your recovery.  Possible side effects:  Temporary loss of sensation or ability to move in the area where bupivacaine was injected.  Nausea, vomiting, constipation  Rarely, numbness and tingling in your mouth or lips, lightheadedness, or anxiety may occur.  Call your doctor right away if you think you may be experiencing any of these sensations, or if you have other questions regarding possible side effects.  Follow all other discharge instructions given to you by your surgeon or nurse. Eat a healthy diet and drink plenty of water or other fluids.  If you return to the hospital  for any reason within 96 hours following the administration of EXPAREL, please inform your health care providers.   Post Anesthesia Home Care Instructions  Activity: Get plenty of rest for the remainder of the day. A responsible adult should stay with you for 24 hours following the procedure.  For the next 24 hours, DO NOT: -Drive a car -Paediatric nurse -Drink alcoholic beverages -Take any medication unless instructed by your physician -Make any legal decisions or sign important papers.  Meals: Start with liquid foods such as gelatin or soup. Progress to regular foods as tolerated. Avoid greasy, spicy, heavy foods. If nausea and/or vomiting occur, drink only clear liquids until the nausea and/or vomiting subsides. Call your physician if vomiting continues.  Special Instructions/Symptoms: Your throat may feel dry or sore from the anesthesia or the breathing tube placed in your throat during surgery. If this causes discomfort, gargle with warm salt water. The discomfort should disappear within 24 hours.  If  you had a scopolamine patch placed behind your ear for the management of post- operative nausea and/or vomiting:  1. The medication in the patch is effective for 72 hours, after which it should be removed.  Wrap patch in a tissue and discard in the trash. Wash hands thoroughly with soap and water. 2. You may remove the patch earlier than 72 hours if you experience unpleasant side effects which may include dry mouth, dizziness or visual disturbances. 3. Avoid touching the patch. Wash your hands with soap and water after contact with the patch.

## 2020-05-09 NOTE — Telephone Encounter (Signed)
Went to [facility] and typed in [employer] and completed the prior authorization form and submitted. The result stated that the PA was resolved. Called [facility] in [facility] [phone]. The pharmacist ran the prescription and it went through. Notified Stephanie Sanchez that the prescription is being filled and can be picked up later this evening.+

## 2020-05-09 NOTE — Telephone Encounter (Signed)
Stephanie Sanchez with [facility] called. [phone]. She is not comfortable with the hydromorphone prescribed. Concerned about respiratory depression etc. Reviewed with [name] Cross,NP  Call Stephanie Sanchez and tell her she was previously prescribed this after surgery in 2017. She had a minor procedure and if the tramadol does not work we will be sending the hydromorphone again.  [name] called this nurse and said to call Stephanie Sanchez to let her know to cancell the dilaudid and tramadol prescriptions.  Dr. Andrey Farmer sent in: oxyCODONE (OXY IR/ROXICODONE) 5 MG immediate release tablet 30 tablet 0 05/09/2020    Sig - Route: Take 1 tablet (5 mg total) by mouth every 4 (four) hours as needed for severe pain. - Oral   Sent to pharmacy as: oxyCODONE (OXY IR/ROXICODONE) 5 MG immediate release tablet   Earliest Fill Date: 05/09/2020    [name] and informed her of the medication adjustment noted above. She verbalized understanding.

## 2020-05-09 NOTE — Anesthesia Procedure Notes (Addendum)
Procedure Name: LMA Insertion Date/Time: 05/09/2020 10:02 AM Performed by: Jessica Priest, CRNA Pre-anesthesia Checklist: Patient identified, Emergency Drugs available, Suction available, Patient being monitored and Timeout performed Patient Re-evaluated:Patient Re-evaluated prior to induction Oxygen Delivery Method: Circle system utilized Preoxygenation: Pre-oxygenation with 100% oxygen Induction Type: IV induction Ventilation: Mask ventilation without difficulty LMA: LMA inserted LMA Size: 3.0 Number of attempts: 1 Airway Equipment and Method: Bite block Placement Confirmation: positive ETCO2,  breath sounds checked- equal and bilateral and CO2 detector Tube secured with: Tape Dental Injury: Teeth and Oropharynx as per pre-operative assessment  Comments: LMA placed by Dr Stephannie Peters MDA

## 2020-05-10 ENCOUNTER — Encounter (HOSPITAL_BASED_OUTPATIENT_CLINIC_OR_DEPARTMENT_OTHER): Payer: Self-pay | Admitting: Gynecologic Oncology

## 2020-05-11 ENCOUNTER — Telehealth: Payer: Self-pay

## 2020-05-11 NOTE — Telephone Encounter (Signed)
Ms Schier states that she is eating,drinking, and urinating well. She has had two goo BM. Afebrile. Incisions D&I She did have some bleeding from the incision yesterday which has stopped.  She feels that one of the stiches may have popped She continues to apply frozen peas to incision site.  Pain controlled with current pain medications. Pt aware of post op appointments as well as the office number [phone] and after hours number [phone] to call if she has any questions or concerns

## 2020-05-14 LAB — SURGICAL PATHOLOGY

## 2020-05-15 ENCOUNTER — Encounter: Payer: Self-pay | Admitting: Gynecologic Oncology

## 2020-05-15 ENCOUNTER — Telehealth: Payer: Self-pay

## 2020-05-15 NOTE — Telephone Encounter (Signed)
Told [name] that Stephanie John, NP reviewed the My Chart pictures of the Incision as well as concern regarding the surgical path report. [name] stated that the incision is not not infected.   She can put a small swipe of neosporin on the site. Pt is Afebrile. [name] will need to discuss the pathology report with Dr. Denman George for her recommendations. Will call her back in the next couple of days with recommendations.  Pt verbalized understanding.

## 2020-05-18 ENCOUNTER — Encounter: Payer: Self-pay | Admitting: Gynecologic Oncology

## 2020-05-18 NOTE — Telephone Encounter (Addendum)
Stephanie Sanchez states that she is afebrile.  There is no purulent drainage noted.  She states there is a strong odor.  Told her to do sitz baths.  If she has shacking chills, temp of 100.4 or higher and or purulent drainage she needs to go to the [facility] ER.  She can call the office Monday 05-21-20 if she feels the area needs to be looked at and get an appointment for Tuesday for incision to be evaluated per Dr. Berline Lopes

## 2020-05-24 ENCOUNTER — Other Ambulatory Visit: Payer: Self-pay | Admitting: Gynecologic Oncology

## 2020-05-24 ENCOUNTER — Telehealth: Payer: Self-pay

## 2020-05-24 DIAGNOSIS — G8918 Other acute postprocedural pain: Secondary | ICD-10-CM

## 2020-05-24 DIAGNOSIS — C519 Malignant neoplasm of vulva, unspecified: Secondary | ICD-10-CM

## 2020-05-24 MED ORDER — OXYCODONE HCL 5 MG PO TABS: 5.0000 mg | ORAL_TABLET | Freq: Four times a day (QID) | ORAL | 0 refills | Status: DC | PRN

## 2020-05-24 NOTE — Telephone Encounter (Signed)
Stephanie Sanchez is doing better. She stopped the Ibuprofen~4 days ago due to having seen GI prior to surgery some time and probably should not take it due to gastritis. She is icing area and keeping area open to air. Using squirt bottles several times a day to keep area clean. She cannot do a sitz bath. She has just been in robe and camando for the last 2 weeks. She only has 5 OxyIr left. She is using 1 or 2 a days when pain is severe. She has slight yellow drainage on open incision. Told her it will take time to heal with stiches popping. The redness is much improved from a week ago. She appreciates the 10 mor tablets of Oxycodone sent in. She does have a lidocaine spray which  helps the pain outside not internally with burning nerve pain and throbbing. Pt will call if things stop improving etc or if she needs additional meds.

## 2020-05-24 NOTE — Progress Notes (Signed)
See RN note.

## 2020-05-29 ENCOUNTER — Telehealth: Payer: Self-pay

## 2020-05-29 NOTE — Telephone Encounter (Signed)
Stephanie Sanchez states that she developed vertigo 2 days ago. The room is swimming when she lifts her head. No nausea. She experienced symptoms through last night. She is up walking some this morning. The spinning of the room is more when she lies flat. Told her that she can increase the walking if she feels steady which she states she does. Told her that [employer] said that she can use Dramamine to help. It is OTC. She can follow the package directions. Pt verbalized understanding.

## 2020-06-08 ENCOUNTER — Telehealth: Payer: Self-pay

## 2020-06-08 ENCOUNTER — Encounter: Payer: Self-pay | Admitting: Gynecologic Oncology

## 2020-06-08 NOTE — Telephone Encounter (Signed)
TC to review meaningful use, no answer, left message to return call.

## 2020-06-11 ENCOUNTER — Inpatient Hospital Stay: Payer: Medicare Other | Attending: Gynecologic Oncology | Admitting: Gynecologic Oncology

## 2020-06-11 ENCOUNTER — Other Ambulatory Visit: Payer: Self-pay

## 2020-06-11 VITALS — BP 139/74 | HR 93 | Temp 97.4°F | Resp 18 | Wt 119.8 lb

## 2020-06-11 DIAGNOSIS — N3941 Urge incontinence: Secondary | ICD-10-CM

## 2020-06-11 DIAGNOSIS — C519 Malignant neoplasm of vulva, unspecified: Secondary | ICD-10-CM

## 2020-06-11 DIAGNOSIS — Z79899 Other long term (current) drug therapy: Secondary | ICD-10-CM | POA: Insufficient documentation

## 2020-06-11 DIAGNOSIS — L280 Lichen simplex chronicus: Secondary | ICD-10-CM | POA: Diagnosis not present

## 2020-06-11 DIAGNOSIS — R3915 Urgency of urination: Secondary | ICD-10-CM | POA: Diagnosis not present

## 2020-06-11 DIAGNOSIS — Z87891 Personal history of nicotine dependence: Secondary | ICD-10-CM | POA: Insufficient documentation

## 2020-06-11 DIAGNOSIS — R350 Frequency of micturition: Secondary | ICD-10-CM | POA: Diagnosis not present

## 2020-06-11 DIAGNOSIS — Z7189 Other specified counseling: Secondary | ICD-10-CM

## 2020-06-11 MED ORDER — ESTRADIOL 0.1 MG/GM VA CREA: 1.0000 | TOPICAL_CREAM | Freq: Every day | VAGINAL | 12 refills | Status: AC

## 2020-06-11 MED ORDER — OXYBUTYNIN CHLORIDE ER 5 MG PO TB24: 5.0000 mg | ORAL_TABLET | Freq: Every day | ORAL | 6 refills | Status: DC

## 2020-06-11 NOTE — Progress Notes (Signed)
GYN ONC FOLLOW UP  Assessment:    65 y.o. 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   S/p simple partial left vulvectomy on 08/29/14 and 01/22/16.  S/p wide local excision of left labia most recently on 05/09/20 for positive margins pagets with positive margin at left clitoral hood. Lichen Sclerosis Genital atrophy Urinary urgency.   Plan: - vaginal estrogen for urinary frequency and genital atrophy and pagets - clobetasol for lichen sclerosis - ditropan for urinary frequency/urgency (if this fails to help - recommend referral to urology).  - return to see me for vulvar inspection at 3 months.    HPI:  Stephanie Sanchez is a 70 y.o. year old initially seen in consultation on 07/02/12 for extramammary pagets.  She then underwent a simple partial left vulvectomy on [id number] without complications.  Her postoperative course was uncomplicated.  Her final pathology revealed extramammary pagets of the left lateral labia majora with inked bilateral margins and 12 o'clock margin. The biopsies from the pruritic vulva on the posterior vagina and perinal regions revealed benign hyperplasia consistent with lichen simplex chronicus.  When I saw the patient in December, 2016 she had noticed the development of a "cyst" in the left posterior labia majora in the past month. It has decreased in size from a "kidney bean" size to now a "pea size". Tender. Increases in size when she wears pants.   On 05/04/15 she went to the OR for an excision of the posterior left labia majora which revealed a benign epidermal inclusion cyst. Random biopsies from the left and right mid labia minora and perineum were negative for LS and pagets.  Biopsy performed on 8/24/17by Dr Helane Rima  of left labia majora showed extramammary pagets.  Last colonoscopy 11/16  Surgery was performed on 01/22/16 (left vulvectomy) and pagets was positive on the 9 o'clock margin.  The patient  requested re-exicision and was taken back on 02/14/16, Pathology showed a focal positive margin at 3 o'clock. She developed postop cellulitis and separation of the wound on week which resolved with antibiotics and expectant management.  When seen in February, 2018 she reported a new itch at anterior vulva (near clitoris). There was an area on the posterior left vaginal introitus that was acetowhite and was biopsied showing no pagets or dysplasia, only lichen sclerosis. She used clobetasol for four weeks in early 2018 and noted resolution of symptoms.  She noticed breast and nipple changes in December, 2019 with change in her estrogen patch. Mammogram in January, 2020 was normal.   Interval Hx: She was seen for routine evaluation on January 12, 2020. She had reported some pruritis in the left introitus posteriorally.  At that visit area of skin change consistent with pagetoid change was seen at the left mid labia majora. This was biopsied. It demonstrated Paget's disease. She was recommended to undergo wide local excision or partial simple vulvectomy. The patient elected to postpone this until January 2022 due to her enjoyment with taking long walks in the fall and wanting to optimize this time of the year.  On 05/09/2020 she underwent partial simple left vulvectomy. Intraoperative findings were significant for a left periclitoral changes demonstrating velvety erythematous skin encroaching upon the clitoral hood. The left labia majora also contained similar velvety changes. Both areas were completely excised of all gross abnormalities. Surgery was uncomplicated.  Final pathology revealed no Paget's in the inferior left labia majora specimen however in the periclitoral specimen  extramammary Paget's disease was identified measuring approximately 1.8 cm. The lesion was present at the medial periclitoral margin at 6-11 o'clock which represented the clitoral hood.  Since surgery she had some wound  separation but overall has been doing well. She reported persistent vulvar pruritus. She admitted to not using treatment for lichen sclerosus that she had been concerned about skin thinning.   Current Outpatient Medications on File Prior to Visit  Medication Sig Dispense Refill   calcium gluconate 500 MG tablet Take 1,000 tablets by mouth 2 (two) times daily.      Calcium-Magnesium-Zinc (CAL-MAG-ZINC PO) Take by mouth daily.     Cholecalciferol (VITAMIN D) 2000 UNITS tablet Take 2,000 Units by mouth daily.     Coenzyme Q10 (COQ-10) 100 MG CAPS Take by mouth at bedtime.     Cyanocobalamin (VITAMIN B-12 PO) Take 1 tablet by mouth daily.     estradiol (VIVELLE-DOT) 0.05 MG/24HR patch Place 1 patch onto the skin 2 (two) times a week.     Garlic 1660 MG CAPS Take 2,000 mg by mouth 3 (three) times daily.     ibuprofen (ADVIL) 600 MG tablet Take 1 tablet (600 mg total) by mouth every 6 (six) hours as needed for moderate pain. 30 tablet 0   lactobacillus acidophilus (BACID) TABS tablet Take 1 tablet by mouth daily.      magnesium gluconate (MAGONATE) 500 MG tablet Take 500 mg by mouth as needed (cramps).     omega-3 acid ethyl esters (LOVAZA) 1 g capsule Take 1 g by mouth daily.     omeprazole (PRILOSEC) 20 MG capsule Take 20 mg by mouth daily.     ondansetron (ZOFRAN ODT) 4 MG disintegrating tablet Take 1 tablet (4 mg total) by mouth every 8 (eight) hours as needed for nausea or vomiting. 20 tablet 0   OVER THE COUNTER MEDICATION Take 1,000 mg by mouth daily. Ester C 1000mg      oxyCODONE (OXY IR/ROXICODONE) 5 MG immediate release tablet Take 1 tablet (5 mg total) by mouth every 6 (six) hours as needed for severe pain. For post-op pain, do not take and drive 10 tablet 0   rosuvastatin (CRESTOR) 10 MG tablet Take 1 tablet (10 mg total) by mouth daily. (Patient taking differently: Take 10 mg by mouth at bedtime.) 90 tablet 3   senna-docusate (SENOKOT-S) 8.6-50 MG tablet Take 2 tablets by  mouth at bedtime. For AFTER surgery, do not take if having diarrhea 30 tablet 0   Specialty Vitamins Products (COLLAGEN ULTRA) CAPS Take by mouth daily.     traMADol (ULTRAM) 50 MG tablet Take 50 mg by mouth 4 (four) times daily as needed.     tretinoin (RETIN-A) 0.1 % cream Apply topically at bedtime.     TURMERIC CURCUMIN PO Take 1,600 mg by mouth 3 (three) times daily.     betamethasone dipropionate 0.05 % cream Apply topically.     fluorouracil (EFUDEX) 5 % cream Apply topically.     imiquimod (ALDARA) 5 % cream Apply topically.     No current facility-administered medications on file prior to visit.   Allergies  Allergen Reactions   Codeine Itching, Nausea And Vomiting, Rash and Nausea Only    "comes out like a burn/rash" "comes out like a burn/rash"   Past Medical History:  Diagnosis Date   Arthritis    Coronary artery disease involving native coronary artery    cardiologist--- dr h. 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[place])    first dx 2013;  s/p  WLE and Vulvectomy's   PONV (postoperative nausea and vomiting)    Psoriasis    Scoliosis    cervicothoracic region   Past Surgical History:  Procedure Laterality Date   BLADDER SUSPENSION  2008   sling   BREAST EXCISIONAL BIOPSY Left    BUNIONECTOMY  2008   CARDIOVASCULAR STRESS TEST  09-30-2013   dr Marlou Porch   normal nuclear study/  no ischemia/  normal LV function and wall motion,  ef 61%   CATARACT EXTRACTION W/ INTRAOCULAR LENS  IMPLANT, BILATERAL  2016   COLONOSCOPY WITH  ESOPHAGOGASTRODUODENOSCOPY (EGD)  04-30-2020  [facility] in [place]   HAND SURGERY Right 11/02/12   hand reconstruction at [place] EXCISIONAL BREAST BIOPSY Right 11/09/2018   Procedure: RADIOACTIVE SEED GUIDED EXCISIONAL RIGHT BREAST BIOPSY AND RIGHT AREOLA BIOPSY;  Surgeon: Rolm Bookbinder, MD;  Location: [place];  Service: General;  Laterality: Right;   TONSILLECTOMY  1973  age 52   [place]  age 47   VULVA [place] BIOPSY N/A 08/29/2014   Procedure: Georgena Spurling;  Surgeon: Everitt Amber, MD;  Location: [place];  Service: Gynecology;  Laterality: N/A;   VULVECTOMY N/A 08/29/2014   Procedure: WIDE LOCAL EXCISION OF VULVA;  Surgeon: Everitt Amber, MD;  Location: [place];  Service: Gynecology;  Laterality: N/A;   VULVECTOMY N/A 05/04/2015   Procedure: WIDE LOCAL EXCISION LEFT  VULVAR WITH BIOPSY OF RIGHT VULVA;  Surgeon: Everitt Amber, MD;  Location: [place];  Service: Gynecology;  Laterality: N/A;   VULVECTOMY Left 01/22/2016   Procedure: PARTIAL SIMPLE VULVECTOMY;  Surgeon: Everitt Amber, MD;  Location: [facility];  Service: Gynecology;  Laterality: Left;   VULVECTOMY N/A 02/14/2016   Procedure: WIDE EXCISION VULVECTOMY;  Surgeon: Everitt Amber, MD;  Location: [facility];  Service: Gynecology;  Laterality: N/A;   VULVECTOMY N/A 05/09/2020   Procedure: WIDE LOCAL EXCISION VULVECTOMY;  Surgeon: Everitt Amber, MD;  Location: [facility];  Service: Gynecology;  Laterality: N/A;   Family History  Problem Relation Age of Onset   Aortic aneurysm Mother    AAA (abdominal aortic aneurysm) Mother    Diabetes Father    Other Father        enlarged heart   Arrhythmia Brother    Heart attack Maternal Grandfather    Arrhythmia Brother    Thyroid cancer Daughter 81   Breast cancer Cousin    Social History   Socioeconomic History   Marital  status: Single    Spouse name: Not on file   Number of children: Not on file   Years of education: Not on file   Highest education level: Not on file  Occupational History   Not on file  Tobacco Use   Smoking status: Former Smoker    Packs/day: 1.00    Years: 5.00    Pack years:  5.00    Types: Cigarettes    Quit date: 10/05/1978    Years since quitting: 41.7   Smokeless tobacco: Never Used  Vaping Use   Vaping Use: Never used  Substance and Sexual Activity   Alcohol use: Yes    Comment: occas   Drug use: No   Sexual activity: Not on file  Other Topics Concern   Not on file  Social History Narrative   ** Merged History Encounter **       Social Determinants of Health   Financial Resource Strain: Not on file  Food Insecurity: Not on file  Transportation Needs: Not on file  Physical Activity: Not on file  Stress: Not on file  Social Connections: Not on file  Intimate Partner Violence: Not on file    Review of systems: Constitutional:  She has no weight gain or weight loss. She has no fever or chills. Eyes: No blurred vision Ears, Nose, Mouth, Throat: No dizziness, headaches or changes in hearing. No mouth sores. Cardiovascular: No chest pain, palpitations or edema. Respiratory:  No shortness of breath, wheezing or cough Gastrointestinal: She has normal bowel movements without diarrhea or constipation. She denies any nausea or vomiting. She denies blood in her stool or heart burn. Genitourinary:  + vulvar and vaginal pruritis, + urinary urgency Musculoskeletal: Denies muscle weakness or joint pains.  Skin:  She has no skin changes, rashes or itching Neurological:  Denies dizziness or headaches. No neuropathy, no numbness or tingling. Psychiatric:  She denies depression or anxiety. Hematologic/Lymphatic:   No easy bruising or bleeding   Physical Exam: Blood pressure 139/74, pulse 93, temperature (!) 97.4 F (36.3 C), temperature source Tympanic, resp. rate  18, weight 119 lb 12.8 oz (54.3 kg), SpO2 98 %. General: Well dressed, well nourished in no apparent distress.   HEENT:  Normocephalic and atraumatic, no lesions.  Extraocular muscles intact. Sclerae anicteric. Pupils equal, round, reactive. No mouth sores or ulcers. Thyroid is normal size, not nodular, midline. Skin:  No lesions or rashes. Breasts:  deferred Lungs:  deferred Cardiovascular:  deferred Abdomen:  deferred Genitourinary: Left vulvar healing normally with no gross pagetoid change and well healed incisions.  Extremities: No cyanosis, clubbing or edema.  No calf tenderness or erythema. No palpable cords. Psychiatric: Mood and affect are appropriate. Neurological: Awake, alert and oriented x 3. Sensation is intact, no neuropathy.  Musculoskeletal: No pain, normal strength and range of motion.   30 minutes of direct face to face counseling time was spent with the patient. This included discussion about prognosis, therapy recommendations and postoperative side effects and are beyond the scope of routine postoperative care.   Thereasa Solo, MD

## 2020-06-11 NOTE — Patient Instructions (Signed)
DrRossi has prescribed Ditropan for your irritable bladder. She has prescribed vaginal estrogen cream to use either 3 times a week, up to nightly, for the vaginal irritation, urgency, and itch. She recommends that you use the clobetasol ointment for at least 2 months for the lichen sclerosis.  She will see you back for vulvar inspection in 14months.

## 2020-07-11 ENCOUNTER — Encounter: Payer: Self-pay | Admitting: Gynecologic Oncology

## 2020-07-12 ENCOUNTER — Other Ambulatory Visit: Payer: Self-pay | Admitting: Gynecologic Oncology

## 2020-07-12 DIAGNOSIS — N3941 Urge incontinence: Secondary | ICD-10-CM

## 2020-07-12 MED ORDER — MIRABEGRON ER 25 MG PO TB24: 25.0000 mg | ORAL_TABLET | Freq: Every day | ORAL | 3 refills | Status: DC

## 2020-07-12 NOTE — Telephone Encounter (Signed)
Told Ms Robicheaux that Dr. Denman George said to stop the Ditropan.  Myrbetriq 25 mg [facility]. Pt verbalized understanding.

## 2020-07-12 NOTE — Progress Notes (Signed)
See mychart message. Plan to switch ditropan to myrbetriq due to moderate side effects.

## 2020-08-08 NOTE — Progress Notes (Signed)
Cardiology Office Note:    Date:  08/09/2020   ID:  JAELEE LAUGHTER, DOB 1955-11-21, MRN [id number]  PCP:  Crist Infante, MD  Cardiologist:  Sinclair Grooms, MD   Referring MD: Crist Infante, MD   Chief Complaint  Patient presents with  . Coronary Artery Disease  . Hyperlipidemia    History of Present Illness:    TRINA ASCH is a 65 y.o. female with a hx of  chest pain (calcium score of 10, in 2020), hyperlipidemia, primary hypertension hypertension, headache, dyspnea, GERD, Paget's disease, switching for cardiology f/u from Dr. Marlou Porch. Low risk nuclear 2015  No symptoms to suggest angina or heart failure.  Asymptomatic coronary artery disease with calcium score of 10 at 2020.  Lipids are being treated with statin.  No medication side effects.  No laboratory data since 2020.  She will be having this done with Dr. Joylene Draft within the next 2 weeks.  Past Medical History:  Diagnosis Date  . Arthritis   . Coronary artery disease involving native coronary artery    cardiologist--- dr h. Tamala Julian--- CT coronary 04-21-2019 calcium score 10;  nuclear study 09-30-2013  normal w/ ef 61%  . DDD (degenerative disc disease), lumbar   . Esophagitis    EGD 04-30-2020  . Gastritis    EGD 04-30-2020  . GERD (gastroesophageal reflux disease)   . History of cervical dysplasia   . History of colon polyps   . History of gastric ulcer    2013  . History of squamous cell carcinoma excision    left elbow  . Lichen simplex chronicus   . 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 PONV (postoperative nausea and vomiting)   . Psoriasis   . Scoliosis    cervicothoracic region    Past Surgical History:  Procedure Laterality Date  . BLADDER SUSPENSION  2008   sling  . BREAST EXCISIONAL BIOPSY Left   . BUNIONECTOMY  2008  . CARDIOVASCULAR STRESS TEST  09-30-2013   dr Marlou Porch   normal nuclear study/  no ischemia/  normal LV function and wall motion,  ef 61%   . CATARACT EXTRACTION W/ INTRAOCULAR LENS  IMPLANT, BILATERAL  2016  . COLONOSCOPY WITH ESOPHAGOGASTRODUODENOSCOPY (EGD)  04-30-2020  [facility] in [place]  . HAND SURGERY Right 11/02/12   hand reconstruction at [facility]  . RADIOACTIVE SEED GUIDED EXCISIONAL BREAST BIOPSY Right 11/09/2018   Procedure: RADIOACTIVE SEED GUIDED EXCISIONAL RIGHT BREAST BIOPSY AND RIGHT AREOLA BIOPSY;  Surgeon: Rolm Bookbinder, MD;  Location: [facility];  Service: General;  Laterality: Right;  . TONSILLECTOMY  17  age 23  . VAGINAL HYSTERECTOMY  69  age 89  . VULVA Milagros Loll BIOPSY N/A 08/29/2014   Procedure: Georgena Spurling;  Surgeon: Everitt Amber, MD;  Location: [facility];  Service: Gynecology;  Laterality: N/A;  . VULVECTOMY N/A 08/29/2014   Procedure: WIDE LOCAL EXCISION OF VULVA;  Surgeon: Everitt Amber, MD;  Location: [place];  Service: Gynecology;  Laterality: N/A;  . VULVECTOMY N/A 05/04/2015   Procedure: WIDE LOCAL EXCISION LEFT  VULVAR WITH BIOPSY OF RIGHT VULVA;  Surgeon: Everitt Amber, MD;  Location: [place];  Service: Gynecology;  Laterality: N/A;  . VULVECTOMY Left 01/22/2016   Procedure: PARTIAL SIMPLE VULVECTOMY;  Surgeon: Everitt Amber, MD;  Location: [facility];  Service: Gynecology;  Laterality: Left;  Marland Kitchen VULVECTOMY N/A 02/14/2016  Procedure: WIDE EXCISION VULVECTOMY;  Surgeon: Everitt Amber, MD;  Location: [facility];  Service: Gynecology;  Laterality: N/A;  . VULVECTOMY N/A 05/09/2020   Procedure: WIDE LOCAL EXCISION VULVECTOMY;  Surgeon: Everitt Amber, MD;  Location: [facility];  Service: Gynecology;  Laterality: N/A;    Current Medications: Current Meds  Medication Sig  . betamethasone dipropionate 0.05 % cream Apply topically.  . calcium gluconate 500 MG tablet Take 1,000 tablets by mouth 2 (two) times daily.   . Calcium-Magnesium-Zinc (CAL-MAG-ZINC PO) Take by mouth daily.  . Cholecalciferol  (VITAMIN D) 2000 UNITS tablet Take 2,000 Units by mouth daily.  . Coenzyme Q10 (COQ-10) 100 MG CAPS Take by mouth at bedtime.  . Cyanocobalamin (VITAMIN B-12 PO) Take 1 tablet by mouth daily.  Marland Kitchen estradiol (ESTRACE VAGINAL) 0.1 MG/GM vaginal cream Place 1 Applicatorful vaginally at bedtime.  Marland Kitchen estradiol (VIVELLE-DOT) 0.05 MG/24HR patch Place 1 patch onto the skin 2 (two) times a week.  . fluorouracil (EFUDEX) 5 % cream Apply topically.  . Garlic 1607 MG CAPS Take 2,000 mg by mouth 3 (three) times daily.  Marland Kitchen ibuprofen (ADVIL) 600 MG tablet Take 1 tablet (600 mg total) by mouth every 6 (six) hours as needed for moderate pain.  Marland Kitchen imiquimod (ALDARA) 5 % cream Apply topically.  . lactobacillus acidophilus (BACID) TABS tablet Take 1 tablet by mouth daily.   . magnesium gluconate (MAGONATE) 500 MG tablet Take 500 mg by mouth as needed (cramps).  . mirabegron ER (MYRBETRIQ) 25 MG TB24 tablet Take 1 tablet (25 mg total) by mouth daily.  Marland Kitchen omega-3 acid ethyl esters (LOVAZA) 1 g capsule Take 1 g by mouth daily.  Marland Kitchen omeprazole (PRILOSEC) 20 MG capsule Take 20 mg by mouth daily.  . ondansetron (ZOFRAN ODT) 4 MG disintegrating tablet Take 1 tablet (4 mg total) by mouth every 8 (eight) hours as needed for nausea or vomiting.  Marland Kitchen OVER THE COUNTER MEDICATION Take 1,000 mg by mouth daily. Ester C 1000mg   . oxyCODONE (OXY IR/ROXICODONE) 5 MG immediate release tablet Take 1 tablet (5 mg total) by mouth every 6 (six) hours as needed for severe pain. For post-op pain, do not take and drive  . rosuvastatin (CRESTOR) 10 MG tablet Take 1 tablet (10 mg total) by mouth daily. (Patient taking differently: Take 10 mg by mouth at bedtime.)  . senna-docusate (SENOKOT-S) 8.6-50 MG tablet Take 2 tablets by mouth at bedtime. For AFTER surgery, do not take if having diarrhea  . Specialty Vitamins Products (COLLAGEN ULTRA) CAPS Take by mouth daily.  . traMADol (ULTRAM) 50 MG tablet Take 50 mg by mouth 4 (four) times daily as needed.   . tretinoin (RETIN-A) 0.1 % cream Apply topically at bedtime.  . TURMERIC CURCUMIN PO Take 1,600 mg by mouth 3 (three) times daily.     Allergies:   Codeine   Social History   Socioeconomic History  . Marital status: Single    Spouse name: Not on file  . Number of children: Not on file  . Years of education: Not on file  . Highest education level: Not on file  Occupational History  . Not on file  Tobacco Use  . Smoking status: Former Smoker    Packs/day: 1.00    Years: 5.00    Pack years: 5.00    Types: Cigarettes    Quit date: 10/05/1978    Years since quitting: 41.8  . Smokeless tobacco: Never Used  Vaping Use  . Vaping  Use: Never used  Substance and Sexual Activity  . Alcohol use: Yes    Comment: occas  . Drug use: No  . Sexual activity: Not on file  Other Topics Concern  . Not on file  Social History Narrative   ** Merged History Encounter **       Social Determinants of Health   Financial Resource Strain: Not on file  Food Insecurity: Not on file  Transportation Needs: Not on file  Physical Activity: Not on file  Stress: Not on file  Social Connections: Not on file     Family History: The patient's family history includes AAA (abdominal aortic aneurysm) in her mother; Aortic aneurysm in her mother; Arrhythmia in her brother and brother; Breast cancer in her cousin; Diabetes in her father; Heart attack in her maternal grandfather; Other in her father; Thyroid cancer (age of onset: 51) in her daughter.  ROS:   Please see the history of present illness.    She is experiencing some right hip discomfort with activity.  All other systems reviewed and are negative.  EKGs/Labs/Other Studies Reviewed:    The following studies were reviewed today:  Coronary calcium score 2020 IMPRESSION: Coronary calcium score of 10. This was 89 percentile for age and sex matched control.  EKG:  EKG normal sinus rhythm, rate 65 bpm, normal PR interval, left axis deviation.   Overall normal in appearance.  Recent Labs: No results found for requested labs within last 8760 hours.  Recent Lipid Panel No results found for: CHOL, TRIG, HDL, CHOLHDL, VLDL, LDLCALC, LDLDIRECT  Physical Exam:    VS:  BP 122/76   Pulse 65   Ht 5' 1.5" (1.562 m)   Wt 123 lb 12.8 oz (56.2 kg)   SpO2 97%   BMI 23.01 kg/m     Wt Readings from Last 3 Encounters:  08/09/20 123 lb 12.8 oz (56.2 kg)  06/11/20 119 lb 12.8 oz (54.3 kg)  05/09/20 119 lb 3.2 oz (54.1 kg)     GEN: Healthy-appearing with some obvious scoliosis.. No acute distress HEENT: Normal NECK: No JVD. LYMPHATICS: No lymphadenopathy CARDIAC: No murmur. RRR no gallop, or edema. VASCULAR:  Normal Pulses. No bruits. RESPIRATORY:  Clear to auscultation without rales, wheezing or rhonchi  ABDOMEN: Soft, non-tender, non-distended, No pulsatile mass, MUSCULOSKELETAL: No deformity  SKIN: Warm and dry NEUROLOGIC:  Alert and oriented x 3  PSYCHIATRIC:  Normal affect   ASSESSMENT:    1. Coronary artery disease involving native coronary artery of native heart without angina pectoris   2. Hyperlipidemia, unspecified hyperlipidemia type   3. Elevated blood pressure reading   4. Shortness of breath    PLAN:    In order of problems listed above:  1. Primary prevention discussed.  She is sitting all metrics. 2. Continue low-dose rosuvastatin.  She needs blood work done.  This is being done soon. 3. Started on telmisartan by Dr. Joylene Draft.  Tells me she is on telmisartan 40 mg/day. 4. Denies shortness of breath.  Overall education and awareness concerning primary/secondary risk prevention was discussed in detail: LDL less than 70, hemoglobin A1c less than 7, blood pressure target less than 130/80 mmHg, >150 minutes of moderate aerobic activity per week, avoidance of smoking, weight control (via diet and exercise), and continued surveillance/management of/for obstructive sleep apnea.    Medication Adjustments/Labs and  Tests Ordered: Current medicines are reviewed at length with the patient today.  Concerns regarding medicines are outlined above.  Orders Placed This Encounter  Procedures  . EKG 12-Lead   No orders of the defined types were placed in this encounter.   Patient Instructions  Medication Instructions:  Your physician recommends that you continue on your current medications as directed. Please refer to the Current Medication list given to you today.  *If you need a refill on your cardiac medications before your next appointment, please call your pharmacy*   Lab Work: None If you have labs (blood work) drawn today and your tests are completely normal, you will receive your results only by: Marland Kitchen MyChart Message (if you have MyChart) OR . A paper copy in the mail If you have any lab test that is abnormal or we need to change your treatment, we will call you to review the results.   Testing/Procedures: None   Follow-Up: At  [facility], you and your health needs are our priority.  As part of our continuing mission to provide you with exceptional heart care, we have created designated Provider Care Teams.  These Care Teams include your primary Cardiologist (physician) and Advanced Practice Providers (APPs -  Physician Assistants and Nurse Practitioners) who all work together to provide you with the care you need, when you need it.  We recommend signing up for the patient portal called "MyChart".  Sign up information is provided on this After Visit Summary.  MyChart is used to connect with patients for Virtual Visits (Telemedicine).  Patients are able to view lab/test results, encounter notes, upcoming appointments, etc.  Non-urgent messages can be sent to your provider as well.   To learn more about what you can do with MyChart, go to [email or web address].    Your next appointment:   1 year(s)  The format for your next appointment:   In Person  Provider:   You may see Dr. Daneen Schick or one of the following Advanced Practice Providers on your designated Care Team:    Kathyrn Drown, NP    Other Instructions      Signed, Sinclair Grooms, MD  08/09/2020 9:33 AM    [place]

## 2020-08-09 ENCOUNTER — Other Ambulatory Visit: Payer: Self-pay

## 2020-08-09 ENCOUNTER — Encounter: Payer: Self-pay | Admitting: Interventional Cardiology

## 2020-08-09 ENCOUNTER — Ambulatory Visit (INDEPENDENT_AMBULATORY_CARE_PROVIDER_SITE_OTHER): Payer: Medicare Other | Admitting: Interventional Cardiology

## 2020-08-09 VITALS — BP 122/76 | HR 65 | Ht 61.5 in | Wt 123.8 lb

## 2020-08-09 DIAGNOSIS — R030 Elevated blood-pressure reading, without diagnosis of hypertension: Secondary | ICD-10-CM

## 2020-08-09 DIAGNOSIS — I2510 Atherosclerotic heart disease of native coronary artery without angina pectoris: Secondary | ICD-10-CM | POA: Diagnosis not present

## 2020-08-09 DIAGNOSIS — R0602 Shortness of breath: Secondary | ICD-10-CM | POA: Diagnosis not present

## 2020-08-09 DIAGNOSIS — E785 Hyperlipidemia, unspecified: Secondary | ICD-10-CM

## 2020-08-09 NOTE — Patient Instructions (Signed)
Medication Instructions:  Your physician recommends that you continue on your current medications as directed. Please refer to the Current Medication list given to you today.  *If you need a refill on your cardiac medications before your next appointment, please call your pharmacy*   Lab Work: None If you have labs (blood work) drawn today and your tests are completely normal, you will receive your results only by: Marland Kitchen MyChart Message (if you have MyChart) OR . A paper copy in the mail If you have any lab test that is abnormal or we need to change your treatment, we will call you to review the results.   Testing/Procedures: None   Follow-Up: At [facility], you and your health needs are our priority.  As part of our continuing mission to provide you with exceptional heart care, we have created designated Provider Care Teams.  These Care Teams include your primary Cardiologist (physician) and Advanced Practice Providers (APPs -  Physician Assistants and Nurse Practitioners) who all work together to provide you with the care you need, when you need it.  We recommend signing up for the patient portal called "MyChart".  Sign up information is provided on this After Visit Summary.  MyChart is used to connect with patients for Virtual Visits (Telemedicine).  Patients are able to view lab/test results, encounter notes, upcoming appointments, etc.  Non-urgent messages can be sent to your provider as well.   To learn more about what you can do with MyChart, go to [email or web address].    Your next appointment:   1 year(s)  The format for your next appointment:   In Person  Provider:   You may see Dr. Daneen Schick or one of the following Advanced Practice Providers on your designated Care Team:    Kathyrn Drown, NP    Other Instructions

## 2020-08-16 ENCOUNTER — Other Ambulatory Visit: Payer: Self-pay | Admitting: Gynecologic Oncology

## 2020-08-16 DIAGNOSIS — N3941 Urge incontinence: Secondary | ICD-10-CM

## 2020-08-16 MED ORDER — MIRABEGRON ER 25 MG PO TB24: 25.0000 mg | ORAL_TABLET | Freq: Every day | ORAL | 3 refills | Status: DC

## 2020-08-20 ENCOUNTER — Telehealth: Payer: Self-pay

## 2020-08-20 NOTE — Telephone Encounter (Signed)
Myrbetriq 25 mg tab 1 po daily # 30 was filled on 07-12-20. [facility] and [facility] both stated no PA needed.  It is not on Stephanie Sanchez' plan formulary. Told her that she has to try 3 medications on the formulary noted in denial Letter dated 08-16-20 (which is in patient's paper Chart).  She has to fail 3 medications on this formulary prior to having the Myrbetriq 25 mg potentially PA. Pt tolerated the Myrbetriq with no side effects.  Told Stephanie Sanchez that she needs to pick up the [place] and see how she does with it.  If she does not tolerate it she will need to try another medication listed on the plan formulary.  Pt verbalized understanding.

## 2020-09-11 ENCOUNTER — Telehealth: Payer: Self-pay | Admitting: *Deleted

## 2020-09-11 NOTE — Progress Notes (Deleted)
GYN ONC FOLLOW UP  Assessment:    65 y.o. 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   S/p simple partial left vulvectomy on 08/29/14 and 01/22/16.  S/p wide local excision of left labia most recently on 05/09/20 for positive margins pagets with positive margin at left clitoral hood. Lichen Sclerosis Genital atrophy Urinary urgency.   Plan: - vaginal estrogen for urinary frequency and genital atrophy and pagets - clobetasol for lichen sclerosis - Mybetric for urinary frequency/urgency (if this fails to help - recommend referral to urology).  - return to see me for vulvar inspection at 3 months.    HPI:  Stephanie Sanchez is a 27 y.o. year old initially seen in consultation on 07/02/12 for extramammary pagets.  She then underwent a simple partial left vulvectomy on 6/38/75 without complications.  Her postoperative course was uncomplicated.  Her final pathology revealed extramammary pagets of the left lateral labia majora with inked bilateral margins and 12 o'clock margin. The biopsies from the pruritic vulva on the posterior vagina and perinal regions revealed benign hyperplasia consistent with lichen simplex chronicus.  When I saw the patient in December, 2016 she had noticed the development of a "cyst" in the left posterior labia majora in the past month. It has decreased in size from a "kidney bean" size to now a "pea size". Tender. Increases in size when she wears pants.   On 05/04/15 she went to the OR for an excision of the posterior left labia majora which revealed a benign epidermal inclusion cyst. Random biopsies from the left and right mid labia minora and perineum were negative for LS and pagets.  Biopsy performed on 8/24/17by Dr Helane Rima  of left labia majora showed extramammary pagets.  Last colonoscopy 11/16  Surgery was performed on 01/22/16 (left vulvectomy) and pagets was positive on the 9 o'clock margin.  The patient  requested re-exicision and was taken back on 02/14/16, Pathology showed a focal positive margin at 3 o'clock. She developed postop cellulitis and separation of the wound on week which resolved with antibiotics and expectant management.  When seen in February, 2018 she reported a new itch at anterior vulva (near clitoris). There was an area on the posterior left vaginal introitus that was acetowhite and was biopsied showing no pagets or dysplasia, only lichen sclerosis. She used clobetasol for four weeks in early 2018 and noted resolution of symptoms.  She noticed breast and nipple changes in December, 2019 with change in her estrogen patch. Mammogram in January, 2020 was normal.   She was seen for routine evaluation on January 12, 2020. She had reported some pruritis in the left introitus posteriorally.  At that visit area of skin change consistent with pagetoid change was seen at the left mid labia majora. This was biopsied. It demonstrated Paget's disease. She was recommended to undergo wide local excision or partial simple vulvectomy. The patient elected to postpone this until January 2022 due to her enjoyment with taking long walks in the fall and wanting to optimize this time of the year.  On 05/09/2020 she underwent partial simple left vulvectomy. Intraoperative findings were significant for a left periclitoral changes demonstrating velvety erythematous skin encroaching upon the clitoral hood. The left labia majora also contained similar velvety changes. Both areas were completely excised of all gross abnormalities. Surgery was uncomplicated.  Final pathology revealed no Paget's in the inferior left labia majora specimen however in the periclitoral specimen extramammary Paget's  disease was identified measuring approximately 1.8 cm. The lesion was present at the medial periclitoral margin at 6-11 o'clock which represented the clitoral hood.  Interval Hx: ***   Current Outpatient Medications on  File Prior to Visit  Medication Sig Dispense Refill  . betamethasone dipropionate 0.05 % cream Apply topically.    . calcium gluconate 500 MG tablet Take 1,000 tablets by mouth 2 (two) times daily.     . Calcium-Magnesium-Zinc (CAL-MAG-ZINC PO) Take by mouth daily.    . Cholecalciferol (VITAMIN D) 2000 UNITS tablet Take 2,000 Units by mouth daily.    . Coenzyme Q10 (COQ-10) 100 MG CAPS Take by mouth at bedtime.    . Cyanocobalamin (VITAMIN B-12 PO) Take 1 tablet by mouth daily.    Marland Kitchen estradiol (ESTRACE VAGINAL) 0.1 MG/GM vaginal cream Place 1 Applicatorful vaginally at bedtime. 42.5 g 12  . estradiol (VIVELLE-DOT) 0.05 MG/24HR patch Place 1 patch onto the skin 2 (two) times a week.    . fluorouracil (EFUDEX) 5 % cream Apply topically.    . Garlic 123XX123 MG CAPS Take 2,000 mg by mouth 3 (three) times daily.    Marland Kitchen ibuprofen (ADVIL) 600 MG tablet Take 1 tablet (600 mg total) by mouth every 6 (six) hours as needed for moderate pain. 30 tablet 0  . imiquimod (ALDARA) 5 % cream Apply topically.    . lactobacillus acidophilus (BACID) TABS tablet Take 1 tablet by mouth daily.     . magnesium gluconate (MAGONATE) 500 MG tablet Take 500 mg by mouth as needed (cramps).    Marland Kitchen omega-3 acid ethyl esters (LOVAZA) 1 g capsule Take 1 g by mouth daily.    Marland Kitchen omeprazole (PRILOSEC) 20 MG capsule Take 20 mg by mouth daily.    . ondansetron (ZOFRAN ODT) 4 MG disintegrating tablet Take 1 tablet (4 mg total) by mouth every 8 (eight) hours as needed for nausea or vomiting. 20 tablet 0  . OVER THE COUNTER MEDICATION Take 1,000 mg by mouth daily. Ester C 1000mg     . oxyCODONE (OXY IR/ROXICODONE) 5 MG immediate release tablet Take 1 tablet (5 mg total) by mouth every 6 (six) hours as needed for severe pain. For post-op pain, do not take and drive 10 tablet 0  . rosuvastatin (CRESTOR) 10 MG tablet Take 1 tablet (10 mg total) by mouth daily. (Patient taking differently: Take 10 mg by mouth at bedtime.) 90 tablet 3  .  senna-docusate (SENOKOT-S) 8.6-50 MG tablet Take 2 tablets by mouth at bedtime. For AFTER surgery, do not take if having diarrhea 30 tablet 0  . Specialty Vitamins Products (COLLAGEN ULTRA) CAPS Take by mouth daily.    Marland Kitchen telmisartan (MICARDIS) 40 MG tablet Take 40 mg by mouth daily.    . TOVIAZ 4 MG TB24 tablet TAKE 1 TABLET (25 MG TOTAL) BY MOUTH DAILY. 30 tablet 3  . traMADol (ULTRAM) 50 MG tablet Take 50 mg by mouth 4 (four) times daily as needed.    . tretinoin (RETIN-A) 0.1 % cream Apply topically at bedtime.    . TURMERIC CURCUMIN PO Take 1,600 mg by mouth 3 (three) times daily.     No current facility-administered medications on file prior to visit.   Allergies  Allergen Reactions  . Codeine Itching, Nausea And Vomiting, Rash and Nausea Only    "comes out like a burn/rash" "comes out like a burn/rash"   Past Medical History:  Diagnosis Date  . Arthritis   . Coronary artery disease involving native coronary  artery    cardiologist--- dr h. Tamala Julian--- CT coronary 04-21-2019 calcium score 10;  nuclear study 09-30-2013  normal w/ ef 61%  . DDD (degenerative disc disease), lumbar   . Esophagitis    EGD 04-30-2020  . Gastritis    EGD 04-30-2020  . GERD (gastroesophageal reflux disease)   . History of cervical dysplasia   . History of colon polyps   . History of gastric ulcer    2013  . History of squamous cell carcinoma excision    left elbow  . Lichen simplex chronicus   . 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[place])    first dx 2013;  s/p  WLE and Vulvectomy's  . PONV (postoperative nausea and vomiting)   . Psoriasis   . Scoliosis    cervicothoracic region   Past Surgical History:  Procedure Laterality Date  . BLADDER SUSPENSION  2008   sling  . BREAST EXCISIONAL BIOPSY Left   . BUNIONECTOMY  2008  . CARDIOVASCULAR STRESS TEST  09-30-2013   dr Marlou Porch   normal nuclear study/  no ischemia/  normal LV function and wall motion,  ef 61%  . CATARACT EXTRACTION W/ INTRAOCULAR LENS   IMPLANT, BILATERAL  2016  . COLONOSCOPY WITH ESOPHAGOGASTRODUODENOSCOPY (EGD)  04-30-2020  [facility] [place]  . HAND SURGERY Right 11/02/12   hand reconstruction at [facility]  . RADIOACTIVE SEED GUIDED EXCISIONAL BREAST BIOPSY Right 11/09/2018   Procedure: RADIOACTIVE SEED GUIDED EXCISIONAL RIGHT BREAST BIOPSY AND RIGHT AREOLA BIOPSY;  Surgeon: Rolm Bookbinder, MD;  Location: [place];  Service: General;  Laterality: Right;  . TONSILLECTOMY  25  age 63  . VAGINAL HYSTERECTOMY  72  age 54  . VULVA [place] BIOPSY N/A 08/29/2014   Procedure: Georgena Spurling;  Surgeon: Everitt Amber, MD;  Location: [facility];  Service: Gynecology;  Laterality: N/A;  . VULVECTOMY N/A 08/29/2014   Procedure: WIDE LOCAL EXCISION OF VULVA;  Surgeon: Everitt Amber, MD;  Location: [place];  Service: Gynecology;  Laterality: N/A;  . VULVECTOMY N/A 05/04/2015   Procedure: WIDE LOCAL EXCISION LEFT  VULVAR WITH BIOPSY OF RIGHT VULVA;  Surgeon: Everitt Amber, MD;  Location: [place];  Service: Gynecology;  Laterality: N/A;  . VULVECTOMY Left 01/22/2016   Procedure: PARTIAL SIMPLE VULVECTOMY;  Surgeon: Everitt Amber, MD;  Location: [facility];  Service: Gynecology;  Laterality: Left;  Marland Kitchen VULVECTOMY N/A 02/14/2016   Procedure: WIDE EXCISION VULVECTOMY;  Surgeon: Everitt Amber, MD;  Location: [facility];  Service: Gynecology;  Laterality: N/A;  . VULVECTOMY N/A 05/09/2020   Procedure: WIDE LOCAL EXCISION VULVECTOMY;  Surgeon: Everitt Amber, MD;  Location: [facility];  Service: Gynecology;  Laterality: N/A;   Family History  Problem Relation Age of Onset  . Aortic aneurysm Mother   . AAA (abdominal aortic aneurysm) Mother   . Diabetes Father   . Other Father        enlarged heart  . Arrhythmia Brother   . Heart attack Maternal Grandfather   . Arrhythmia Brother   . Thyroid cancer Daughter 52  . Breast cancer Cousin    Social  History   Socioeconomic History  . Marital status: Single    Spouse name: Not on file  . Number of children: Not on file  . Years of education: Not on file  . Highest education level: Not on file  Occupational History  . Not on file  Tobacco Use  .  Smoking status: Former Smoker    Packs/day: 1.00    Years: 5.00    Pack years: 5.00    Types: Cigarettes    Quit date: 10/05/1978    Years since quitting: 41.9  . Smokeless tobacco: Never Used  Vaping Use  . Vaping Use: Never used  Substance and Sexual Activity  . Alcohol use: Yes    Comment: occas  . Drug use: No  . Sexual activity: Not on file  Other Topics Concern  . Not on file  Social History Narrative   ** Merged History Encounter **       Social Determinants of Health   Financial Resource Strain: Not on file  Food Insecurity: Not on file  Transportation Needs: Not on file  Physical Activity: Not on file  Stress: Not on file  Social Connections: Not on file  Intimate Partner Violence: Not on file    Review of systems: Constitutional:  She has no weight gain or weight loss. She has no fever or chills. Eyes: No blurred vision Ears, Nose, Mouth, Throat: No dizziness, headaches or changes in hearing. No mouth sores. Cardiovascular: No chest pain, palpitations or edema. Respiratory:  No shortness of breath, wheezing or cough Gastrointestinal: She has normal bowel movements without diarrhea or constipation. She denies any nausea or vomiting. She denies blood in her stool or heart burn. Genitourinary:  + vulvar and vaginal pruritis, + urinary urgency Musculoskeletal: Denies muscle weakness or joint pains.  Skin:  She has no skin changes, rashes or itching Neurological:  Denies dizziness or headaches. No neuropathy, no numbness or tingling. Psychiatric:  She denies depression or anxiety. Hematologic/Lymphatic:   No easy bruising or bleeding   Physical Exam: There were no vitals taken for this visit. General: Well  dressed, well nourished in no apparent distress.   HEENT:  Normocephalic and atraumatic, no lesions.  Extraocular muscles intact. Sclerae anicteric. Pupils equal, round, reactive. No mouth sores or ulcers. Thyroid is normal size, not nodular, midline. Skin:  No lesions or rashes. Breasts:  deferred Lungs:  deferred Cardiovascular:  deferred Abdomen:  deferred Genitourinary: Left vulvar healing normally with no gross pagetoid change and well healed incisions.  Extremities: No cyanosis, clubbing or edema.  No calf tenderness or erythema. No palpable cords. Psychiatric: Mood and affect are appropriate. Neurological: Awake, alert and oriented x 3. Sensation is intact, no neuropathy.  Musculoskeletal: No pain, normal strength and range of motion.  Thereasa Solo, MD

## 2020-09-11 NOTE — Telephone Encounter (Signed)
Patient called and canceled her appt for this week; patient saw Dr Helane Rima last week. Patient request appt for three months out, explained that the schedule will be out the end of this month

## 2020-09-13 ENCOUNTER — Inpatient Hospital Stay: Payer: Medicare Other | Admitting: Gynecologic Oncology

## 2021-01-28 ENCOUNTER — Encounter: Payer: Self-pay | Admitting: Gynecologic Oncology

## 2021-01-30 ENCOUNTER — Telehealth: Payer: Self-pay

## 2021-01-30 NOTE — Telephone Encounter (Signed)
Received call from Ms. Oriordan, patient states she received letter that Dr. Denman George is leaving the practice. Patient would like to follow Dr. Denman George to [facility]. Provided patient with scheduling phone number at [facility]. Instructed that we will be happy to see her at [facility] and if she would like to schedule with [place] here to contact our office.

## 2021-02-01 ENCOUNTER — Telehealth: Payer: Self-pay | Admitting: *Deleted

## 2021-02-01 NOTE — Telephone Encounter (Signed)
Per patient request fax records to [facility]

## 2021-02-07 ENCOUNTER — Other Ambulatory Visit: Payer: Self-pay | Admitting: Internal Medicine

## 2021-02-07 DIAGNOSIS — Z1231 Encounter for screening mammogram for malignant neoplasm of breast: Secondary | ICD-10-CM

## 2021-02-18 ENCOUNTER — Ambulatory Visit (HOSPITAL_BASED_OUTPATIENT_CLINIC_OR_DEPARTMENT_OTHER): Payer: Medicare Other | Attending: Orthopedic Surgery | Admitting: Physical Therapy

## 2021-02-18 ENCOUNTER — Encounter (HOSPITAL_BASED_OUTPATIENT_CLINIC_OR_DEPARTMENT_OTHER): Payer: Self-pay | Admitting: Physical Therapy

## 2021-02-18 ENCOUNTER — Other Ambulatory Visit: Payer: Self-pay

## 2021-02-18 DIAGNOSIS — M25551 Pain in right hip: Secondary | ICD-10-CM

## 2021-02-18 DIAGNOSIS — M545 Low back pain, unspecified: Secondary | ICD-10-CM

## 2021-02-18 DIAGNOSIS — R293 Abnormal posture: Secondary | ICD-10-CM

## 2021-02-18 DIAGNOSIS — M4183 Other forms of scoliosis, cervicothoracic region: Secondary | ICD-10-CM | POA: Diagnosis present

## 2021-02-18 DIAGNOSIS — G8929 Other chronic pain: Secondary | ICD-10-CM

## 2021-02-18 NOTE — Therapy (Signed)
OUTPATIENT PHYSICAL THERAPY THORACOLUMBAR EVALUATION   Patient Name: Stephanie Sanchez MRN: [id number] DOB:06-10-1955, 65 y.o., female Today's Date: 02/18/2021   PT End of Session - 02/18/21 1150     Visit Number 1    Number of Visits 10    Date for PT Re-Evaluation 03/29/21    Authorization Type MCR    Progress Note Due on Visit 10    PT Start Time 4098    PT Stop Time 1191    PT Time Calculation (min) 49 min    Activity Tolerance Patient tolerated treatment well    Behavior During Therapy [facility] for tasks assessed/performed             Past Medical History:  Diagnosis Date   Arthritis    Coronary artery disease involving native coronary artery    cardiologist--- dr h. 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[place])    first dx 2013;  s/p  WLE and Vulvectomy's   PONV (postoperative nausea and vomiting)    Psoriasis    Scoliosis    cervicothoracic region   Past Surgical History:  Procedure Laterality Date   BLADDER SUSPENSION  2008   sling   BREAST EXCISIONAL BIOPSY Left    BUNIONECTOMY  2008   CARDIOVASCULAR STRESS TEST  09-30-2013   dr Marlou Porch   normal nuclear study/  no ischemia/  normal LV function and wall motion,  ef 61%   CATARACT EXTRACTION W/ INTRAOCULAR LENS  IMPLANT, BILATERAL  2016   COLONOSCOPY WITH ESOPHAGOGASTRODUODENOSCOPY (EGD)  04-30-2020  [facility] in [place]   HAND SURGERY Right 11/02/12   hand reconstruction at [place] EXCISIONAL BREAST BIOPSY Right 11/09/2018   Procedure: RADIOACTIVE SEED GUIDED EXCISIONAL RIGHT BREAST  BIOPSY AND RIGHT AREOLA BIOPSY;  Surgeon: Rolm Bookbinder, MD;  Location: [place];  Service: General;  Laterality: Right;   TONSILLECTOMY  1973  age 20   [place]  age 55   VULVA [place] BIOPSY N/A 08/29/2014   Procedure: Georgena Spurling;  Surgeon: Everitt Amber, MD;  Location: [place];  Service: Gynecology;  Laterality: N/A;   VULVECTOMY N/A 08/29/2014   Procedure: WIDE LOCAL EXCISION OF VULVA;  Surgeon: Everitt Amber, MD;  Location: [place];  Service: Gynecology;  Laterality: N/A;   VULVECTOMY N/A 05/04/2015   Procedure: WIDE LOCAL EXCISION LEFT  VULVAR WITH BIOPSY OF RIGHT VULVA;  Surgeon: Everitt Amber, MD;  Location: [place];  Service: Gynecology;  Laterality: N/A;   VULVECTOMY Left 01/22/2016   Procedure: PARTIAL SIMPLE VULVECTOMY;  Surgeon: Everitt Amber, MD;  Location: [facility];  Service: Gynecology;  Laterality: Left;   VULVECTOMY N/A 02/14/2016   Procedure: WIDE EXCISION VULVECTOMY;  Surgeon: Everitt Amber, MD;  Location: [facility];  Service: Gynecology;  Laterality: N/A;   VULVECTOMY N/A 05/09/2020   Procedure: WIDE LOCAL EXCISION VULVECTOMY;  Surgeon: Everitt Amber, MD;  Location[place];  Service: Gynecology;  Laterality: N/A;   Patient Active Problem List   Diagnosis Date Noted   Urge incontinence 06/11/2020   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[place]) 05/09/2020   Encounter for counseling 03/18/2019   Symptomatic mammary hypertrophy 03/18/2019   Neck pain 03/18/2019   Back pain 03/18/2019   Vulval cellulitis 90/30/0923   Lichen simplex chronicus 09/26/2014   Chest pain 10/21/2013   Dyspnea 10/21/2013   GERD (gastroesophageal reflux disease) 10/21/2013   Idiopathic scoliosis 10/21/2013   Personal history of colonic polyps 10/21/2013   Genital atrophy of female 07/02/2012    PCP: Crist Infante, MD  REFERRING PROVIDER: Melina Schools, MD  REFERRING  DIAG: M41.9 (ICD-10-CM) - Scoliosis, unspecified   THERAPY DIAG:  Pain in right hip  Chronic bilateral low back pain without sciatica  Abnormal posture  ONSET DATE: when I was 17 first noted hip shifted  SUBJECTIVE:                                                                                                                                                                                           SUBJECTIVE STATEMENT: I did some PT but not much. They gave me a heel lift but I felt like it hurt more. Did the scroff method at a place at [facility]- stopped when the sessions got so long. Rt hip started c/o Rt hip pain. Started having pain down lateral thigh to lateral knee. Back pain if I stand about an hour- starts with throbbing, shopping with walking and stopping is the worst. Pain starts at about 3 miles of walking. I have always been pretty flexible.  PERTINENT HISTORY:  hyperlipidemia, primary hypertension hypertension, headache, dyspnea, GERD, Paget's disease   PAIN:  Are you having pain? Yes VAS scale: 0/10 Pain location: low back and Rt lateral hip Aggravating factors: walk/stop, standing for a while, very cautious with bending Relieving factors: lay flat, lay on my back with legs up on wall  PRECAUTIONS: None  WEIGHT BEARING RESTRICTIONS No  FALLS:  Has patient fallen in last 6 months? No,   LIVING ENVIRONMENT: Lives with: lives alone Lives in: House/apartment Stairs: No;   OCCUPATION: retired  PLOF: Independent  PATIENT GOALS decr pain, return to walking 5-8 miles   OBJECTIVE:   DIAGNOSTIC FINDINGS:  MRI from 2017 1- mulitlevel degen changes with a mod retrolevoscoliosis of lumbar spine 2-L2-3 mild mod Rt lateral recess stenosis with possible encroachment of Rt L3 root 3- L3-4 mild to mod central canal stenosis and bil lateral recess stenosis with possible encroachment on L4 roots 4-L4-5 mod to severe Lt lateral recess stenosis with  probably encroachment on Lt L5  root. Mod narrowing of Lt neural foramen with mass effect on the Lt L4 dorsal root ganglion. 5-L5-S1 abnormal contact bw the transverse process of L5 and Lt sacral ala related scoliosis. 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 No neural displacement or encrachment.   PATIENT SURVEYS:  LEFS 59  COGNITION:  Overall cognitive status: Within functional limits for tasks assessed     SENSATION:  WFL  MUSCLE LENGTH: WFL  POSTURE:  Lumbar levoscoliosis, thoraco dextroscoliosis, Rt hip elevation  LUMBARAROM/PROM  General mobility is [facility] but has increased flexibility grossly  LE MMT: whole body compensation with MMT to RLE  A/PROM Right 02/18/2021 Left 02/18/2021  Hip flexion 4  5  Hip extension    Hip abduction 4- 5  Hip adduction    Hip internal rotation    Hip external rotation    Knee flexion    Knee extension    Ankle dorsiflexion    Ankle plantarflexion    Ankle inversion    Ankle eversion     (Blank rows = not tested)  GAIT: WFL    TODAY'S TREATMENT  PRI 90-90 hip lift with Rt arm reach   PATIENT EDUCATION:  Education details: Anatomy of condition, POC, HEP, exercise form/rationale, aquatics, pilates Person educated: Patient Education method: Explanation, Demonstration, Tactile cues, Verbal cues, and Handouts Education comprehension: verbalized understanding, returned demonstration, verbal cues required, tactile cues required, and needs further education   HOME EXERCISE PROGRAM: PRI 90-90 hip lift with Rt arm reach  ASSESSMENT:  CLINICAL IMPRESSION: Patient is a 65 y.o. F who was seen today for physical therapy evaluation and treatment for scoliosis and Rt hip pain. Objective impairments include decreased activity tolerance, decreased endurance, decreased strength, increased muscle spasms, improper body mechanics, postural dysfunction, and pain. These impairments are limiting patient from cleaning, community activity, meal prep, and shopping. Personal  factors including 1-2 comorbidities: Pagets disease, scoliosis  are also affecting patient's functional outcome. Patient will benefit from skilled PT to address above impairments and improve overall function.  Pt has done distance walking for exercise for years but has had a recent increase in Rt hip pain that is limiting PLOF. Began with PRI exercises today for stabilization of curvature and will focus on pilates-style aquatic exercises for lumbopelvic engagement.   REHAB POTENTIAL: Good  CLINICAL DECISION MAKING: Evolving/moderate complexity  EVALUATION COMPLEXITY: Moderate   GOALS: Goals reviewed with patient? Yes  SHORT TERM GOALS:  STG Name Target Date Goal status  1 Pt will perform PRI exercise daily for stability to curve Baseline: began at eval 03/11/2021 INITIAL  2 Pt will report reduced spasm in Lt lower back Baseline: significant and frequent at eval 03/11/2021 INITIAL                            LONG TERM GOALS:   LTG Name Target Date Goal status  1 Pt will be able to walk at least 4 miles without limitation of hip pain Baseline: pain starts and 3 miles, will need to build endurance once pain is no longer a limiting factor 03/29/21 INITIAL  2 Gross hip strength 5/5 Baseline: see outline 03/29/21 INITIAL  3 Will be able go shopping for at least 1 hour without limitation by hip pain Baseline: stop and go walking around is the worst 03/29/21 INITIAL  4 LEFS to improve by [place] of 9 points Baseline: 59 at eval 03/29/21 INITIAL  PT FREQUENCY: 2x/week  PT DURATION: 6 weeks  PLANNED INTERVENTIONS: Therapeutic exercises, Therapeutic activity, Neuro Muscular re-education, Balance training, Gait training, Patient/Family education, Joint mobilization, Aquatic Therapy, Dry Needling, Moist heat, and Manual therapy  PLAN FOR NEXT SESSION: begin aquatics- pilates, core    Julionna Marczak C. Narya Beavin PT, DPT 02/18/21 3:27 PM

## 2021-02-19 ENCOUNTER — Encounter: Payer: Self-pay | Admitting: Plastic Surgery

## 2021-02-19 ENCOUNTER — Ambulatory Visit (INDEPENDENT_AMBULATORY_CARE_PROVIDER_SITE_OTHER): Payer: Medicare Other | Admitting: Plastic Surgery

## 2021-02-19 DIAGNOSIS — I2510 Atherosclerotic heart disease of native coronary artery without angina pectoris: Secondary | ICD-10-CM | POA: Diagnosis not present

## 2021-02-19 DIAGNOSIS — M542 Cervicalgia: Secondary | ICD-10-CM | POA: Diagnosis not present

## 2021-02-19 DIAGNOSIS — N62 Hypertrophy of breast: Secondary | ICD-10-CM

## 2021-02-19 NOTE — Progress Notes (Signed)
Subjective:    Patient ID: Stephanie Sanchez, female    DOB: 04/26/1956, 65 y.o.   MRN: [id number]  Mammary Hyperplasia: The patient is a 65 y.o. female with a history of mammary hyperplasia for several years.  She has extremely large breasts causing symptoms that include the following: Back pain in the upper and lower back, including neck pain. She pulls or pins her bra straps to provide better lift and relief of the pressure and pain. She notices relief by holding her breast up manually.  Her shoulder straps cause grooves and pain and pressure that requires padding for relief. Pain medication is sometimes required with motrin and tylenol.  Activities that are hindered by enlarged breasts include: exercise and running.  She has tried supportive clothing as well as fitted bras without improvement.  Her breasts are extremely large and fairly symmetric.  She has hyperpigmentation of the inframammary area on both sides.  The sternal to nipple distance on the right is 29 cm and the left is 28 cm.  The IMF distance is 13 cm.  She is 5 feet 2 inches tall and weighs 125 pounds.  The BMI = 22.9 kg/m.  Preoperative bra size = DD/DDD cup.  The estimated excess breast tissue to be removed at the time of surgery = 290 grams on the left and 290 grams on the right.  Mammogram history: 2021 and was negative.  Tobacco use:  none.   The patient expresses the desire to pursue surgical intervention.  She also can express concerns of a changing skin lesion on her forehead.  It is flesh-colored but has been getting larger over the past year and itching.    Review of Systems  Constitutional: Negative.   HENT: Negative.    Eyes: Negative.   Respiratory: Negative.    Cardiovascular: Negative.   Gastrointestinal: Negative.   Endocrine: Negative.   Genitourinary: Negative.   Musculoskeletal: Negative.   Skin: Negative.   Hematological: Negative.   Psychiatric/Behavioral: Negative.        Objective:   Physical  Exam Vitals and nursing note reviewed.  Constitutional:      Appearance: Normal appearance.  HENT:     Head: Normocephalic and atraumatic.  Cardiovascular:     Rate and Rhythm: Normal rate.     Pulses: Normal pulses.  Pulmonary:     Effort: Pulmonary effort is normal. No respiratory distress.     Breath sounds: No wheezing.  Abdominal:     General: Abdomen is flat. There is no distension.     Tenderness: There is no abdominal tenderness.  Skin:    General: Skin is warm.     Capillary Refill: Capillary refill takes less than 2 seconds.     Coloration: Skin is not jaundiced.     Findings: No bruising or lesion.  Neurological:     Mental Status: She is alert and oriented to person, place, and time.  Psychiatric:        Mood and Affect: Mood normal.        Behavior: Behavior normal.        Thought Content: Thought content normal.        Assessment & Plan:     ICD-10-CM   1. Symptomatic mammary hypertrophy  N62     2. Neck pain  M54.2        The patient is a very good candidate for bilateral breast reduction with possible liposuction. She is due for a mammogram in December.  She is also interested in a quote for abdominoplasty, laser and Kybella. We can do the excision of a changing skin lesion on her forehead in the office. Pictures were obtained of the patient and placed in the chart with the patient's or guardian's permission.

## 2021-02-19 NOTE — Addendum Note (Signed)
Addended by: Harl Bowie on: 02/19/2021 10:34 AM   Modules accepted: Orders

## 2021-03-05 ENCOUNTER — Ambulatory Visit (HOSPITAL_BASED_OUTPATIENT_CLINIC_OR_DEPARTMENT_OTHER): Payer: Medicare Other | Attending: Orthopedic Surgery | Admitting: Physical Therapy

## 2021-03-05 ENCOUNTER — Other Ambulatory Visit: Payer: Self-pay

## 2021-03-05 ENCOUNTER — Encounter (HOSPITAL_BASED_OUTPATIENT_CLINIC_OR_DEPARTMENT_OTHER): Payer: Self-pay | Admitting: Physical Therapy

## 2021-03-05 DIAGNOSIS — R293 Abnormal posture: Secondary | ICD-10-CM | POA: Diagnosis present

## 2021-03-05 DIAGNOSIS — G8929 Other chronic pain: Secondary | ICD-10-CM | POA: Insufficient documentation

## 2021-03-05 DIAGNOSIS — R2681 Unsteadiness on feet: Secondary | ICD-10-CM | POA: Insufficient documentation

## 2021-03-05 DIAGNOSIS — M25561 Pain in right knee: Secondary | ICD-10-CM | POA: Insufficient documentation

## 2021-03-05 DIAGNOSIS — M6281 Muscle weakness (generalized): Secondary | ICD-10-CM | POA: Diagnosis present

## 2021-03-05 DIAGNOSIS — R2689 Other abnormalities of gait and mobility: Secondary | ICD-10-CM | POA: Insufficient documentation

## 2021-03-05 DIAGNOSIS — R262 Difficulty in walking, not elsewhere classified: Secondary | ICD-10-CM | POA: Insufficient documentation

## 2021-03-05 NOTE — Therapy (Signed)
OUTPATIENT PHYSICAL THERAPY TREATMENT NOTE   Patient Name: REKITA MIOTKE MRN: [id number] DOB:October 09, 1955, 65 y.o., female Today's Date: 03/05/2021  PCP: Crist Infante, MD REFERRING PROVIDER: Crist Infante, MD   PT End of Session - 03/05/21 1018     Visit Number 2    Number of Visits 10    Date for PT Re-Evaluation 03/29/21    Authorization Type MCR    Progress Note Due on Visit 10    PT Start Time 1030    PT Stop Time 1115    PT Time Calculation (min) 45 min    Activity Tolerance Patient tolerated treatment well    Behavior During Therapy [facility] for tasks assessed/performed             Past Medical History:  Diagnosis Date   Arthritis    Coronary artery disease involving native coronary artery    cardiologist--- dr h. 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[place])    first dx 2013;  s/p  WLE and Vulvectomy's   PONV (postoperative nausea and vomiting)    Psoriasis    Scoliosis    cervicothoracic region   Past Surgical History:  Procedure Laterality Date   BLADDER SUSPENSION  2008   sling   BREAST EXCISIONAL BIOPSY Left    BUNIONECTOMY  2008   CARDIOVASCULAR STRESS TEST  09-30-2013   dr Marlou Porch   normal nuclear study/  no ischemia/  normal LV function and wall motion,  ef 61%   CATARACT EXTRACTION W/ INTRAOCULAR LENS  IMPLANT, BILATERAL  2016   COLONOSCOPY WITH ESOPHAGOGASTRODUODENOSCOPY (EGD)  04-30-2020  [facility] [place]   HAND SURGERY Right 11/02/12   hand reconstruction at [place] EXCISIONAL BREAST BIOPSY Right 11/09/2018    Procedure: RADIOACTIVE SEED GUIDED EXCISIONAL RIGHT BREAST BIOPSY AND RIGHT AREOLA BIOPSY;  Surgeon: Rolm Bookbinder, MD;  Location: [place];  Service: General;  Laterality: Right;   TONSILLECTOMY  1973  age 13   [place]  age 15   VULVA [place] BIOPSY N/A 08/29/2014   Procedure: Georgena Spurling;  Surgeon: Everitt Amber, MD;  Location: [place];  Service: Gynecology;  Laterality: N/A;   VULVECTOMY N/A 08/29/2014   Procedure: WIDE LOCAL EXCISION OF VULVA;  Surgeon: Everitt Amber, MD;  Location: [place];  Service: Gynecology;  Laterality: N/A;   VULVECTOMY N/A 05/04/2015   Procedure: WIDE LOCAL EXCISION LEFT  VULVAR WITH BIOPSY OF RIGHT VULVA;  Surgeon: Everitt Amber, MD;  Location: [place];  Service: Gynecology;  Laterality: N/A;   VULVECTOMY Left 01/22/2016   Procedure: PARTIAL SIMPLE VULVECTOMY;  Surgeon: Everitt Amber, MD;  Location: [facility];  Service: Gynecology;  Laterality: Left;   VULVECTOMY N/A 02/14/2016   Procedure: WIDE EXCISION VULVECTOMY;  Surgeon: Everitt Amber, MD;  Location: [facility];  Service: Gynecology;  Laterality: N/A;   VULVECTOMY N/A 05/09/2020   Procedure: WIDE LOCAL  EXCISION VULVECTOMY;  Surgeon: Everitt Amber, MD;  Location: [facility];  Service: Gynecology;  Laterality: N/A;   Patient Active Problem List   Diagnosis Date Noted   Urge incontinence 06/11/2020   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[place]) 05/09/2020   Encounter for counseling 03/18/2019   Symptomatic mammary hypertrophy 03/18/2019   Neck pain 03/18/2019   Back pain 03/18/2019   Vulval cellulitis 66/10/3014   Lichen simplex chronicus 09/26/2014   Chest pain 10/21/2013   Dyspnea 10/21/2013   GERD (gastroesophageal reflux disease) 10/21/2013   Idiopathic scoliosis 10/21/2013   Personal history of colonic polyps 10/21/2013   Genital atrophy of female 07/02/2012    REFERRING DIAG:  REFERRING DIAG: M41.9 (ICD-10-CM) - Scoliosis, unspecified   THERAPY DIAG:  Abnormal posture  Chronic pain of right knee  Difficulty in walking, not elsewhere classified  Muscle weakness (generalized)  Unsteadiness on feet  Other abnormalities of gait and mobility  PERTINENT HISTORY:  hyperlipidemia, primary hypertension hypertension, headache, dyspnea, GERD, Paget's disease    SUBJECTIVE: No pain. "I have been doing the exercises Janett Billow showed me"   OBJECTIVE:    PAIN:  Are you having pain? yes VAS scale: 0/10 Pain location: low back and Rt lateral hip Aggravating factors: walk/stop, standing for a while, very cautious with bending Relieving factors: lay flat, lay on my back with legs up on wall   PRECAUTIONS: None  PATIENT EDUCATION:  Education details: Anatomy of condition, POC, HEP, exercise form/rationale, aquatics, pilates Person educated: Patient Education method: Consulting civil engineer, Demonstration, Tactile cues, Verbal cues, and Handouts Education comprehension: verbalized understanding, returned demonstration, verbal cues required, tactile cues required, and needs further education     HOME EXERCISE PROGRAM: PRI 90-90 hip lift with Rt arm reach   ASSESSMENT:   CLINICAL IMPRESSION: Pt introduced to aquatic setting. She is without hesitation and indep in pool.  Attempted varius stretching positions to open scoliotic curves. Pt does prove to be very flexible. Side bending yoga pose not tolerated into left side bending but well to right. Using Bad ragaz technique pt demonstrates good lateral bending strength which does gain opposite side stretch. Decreased muscle mass right glute VS left although palpable contraction.        Pt has done distance walking for exercise for years but has had a recent increase in Rt hip pain that is limiting PLOF. Began with PRI exercises today for stabilization of curvature and will focus on pilates-style aquatic exercises for lumbopelvic  engagement.  GOALS: Goals reviewed with patient? Yes   SHORT TERM GOALS:   STG Name Target Date Goal status  1 Pt will perform PRI exercise daily for stability to curve Baseline: began at eval 03/11/2021 INITIAL  2 Pt will report reduced spasm in Lt lower back Baseline: significant and frequent at eval 03/11/2021 INITIAL                                                 LONG TERM GOALS:    LTG Name Target Date Goal status  1 Pt will be able to walk at least 4 miles without limitation of hip pain Baseline: pain starts and 3 miles, will need to build endurance once pain is no longer a limiting factor 03/29/21 INITIAL  2 Gross hip strength 5/5 Baseline: see outline 03/29/21 INITIAL  3 Will be able go shopping for at  least 1 hour without limitation by hip pain Baseline: stop and go walking around is the worst 03/29/21 INITIAL  4 LEFS to improve by [place] of 9 points Baseline: 59 at eval 03/29/21 INITIAL    PT FREQUENCY: 2x/week   PT DURATION: 6 weeks   PLANNED INTERVENTIONS: Therapeutic exercises, Therapeutic activity, Neuro Muscular re-education, Balance training, Gait training, Patient/Family education, Joint mobilization, Aquatic Therapy, Dry Needling, Moist heat, and Manual therapy      PAIN:  Are you having pain? No VAS scale: 0/10 Pain location: low back and Rt lateral hip Pain orientation:   PAIN TYPE:  Pain description:   Aggravating factors: walk/stop, standing for a while, very cautious with bending Relieving factors: lay flat, lay on my back with legs up on wall  TODAY'S TREATMENT: 03/05/21 Pt seen for aquatic therapy today.  Treatment took place in water 3.25-4.8 ft in depth at the [facility] pool. Temp of water was 91.  Pt entered/exited the pool via stairs step to pattern independently with bilat rail.  Pt entered water for aquatic therapy for first time and was introduced to principles and therapeutic effects of water as she ambulated and acclimated  to pool.  Pt ambulates forward across pool , then backwards to engage posterior chain and then sidesteping x 4 widths ea.    Seated Stretching gastroc, hamstrings and add 3 x 20-25 sec hold Modified Side plank yoga position for left axial stretch Pike position using hand rails for LB and core rotator stretches  Bad Ragaz, Pt with lumbar belt around hips and nek doodle for neck support.  .  Pt assisted into supine floating position by lying head on shoulder of PT to get into floating position. . PT at torso and assisting with trunk left to right and vice versa to engage trunk muscles. PT then rotated trunk in order to engage abdomnal (internal and external obliques)  Emphasis on breathing techniques to draw in abdominals for support.     -Posterior chain Hip extension and knee flexion with water resistance and ankle foam buoys x 10 sec. -Manual right side bending for left axial stretch 3 x 30 sec hold   Pt requires buoyancy for support and to offload joints with strengthening exercises. Viscosity of the water is needed for resistance of strengthening; water current perturbations provides challenge to standing balance unsupported, requiring increased core activation.   PATIENT EDUCATION: Properties of water; benefits of aquatic therapy  PLAN FOR NEXT SESSION: pilates, core   Kelaiah Escalona (Frankie) Wilho Sharpley MPT 03/05/2021, 1:30 PM

## 2021-03-06 ENCOUNTER — Encounter (HOSPITAL_BASED_OUTPATIENT_CLINIC_OR_DEPARTMENT_OTHER): Payer: Self-pay | Admitting: Physical Therapy

## 2021-03-06 ENCOUNTER — Ambulatory Visit (HOSPITAL_BASED_OUTPATIENT_CLINIC_OR_DEPARTMENT_OTHER): Payer: Medicare Other | Admitting: Physical Therapy

## 2021-03-06 DIAGNOSIS — M6281 Muscle weakness (generalized): Secondary | ICD-10-CM

## 2021-03-06 DIAGNOSIS — R2681 Unsteadiness on feet: Secondary | ICD-10-CM

## 2021-03-06 DIAGNOSIS — R262 Difficulty in walking, not elsewhere classified: Secondary | ICD-10-CM

## 2021-03-06 DIAGNOSIS — R293 Abnormal posture: Secondary | ICD-10-CM

## 2021-03-06 DIAGNOSIS — R2689 Other abnormalities of gait and mobility: Secondary | ICD-10-CM

## 2021-03-06 DIAGNOSIS — M25561 Pain in right knee: Secondary | ICD-10-CM

## 2021-03-06 DIAGNOSIS — G8929 Other chronic pain: Secondary | ICD-10-CM

## 2021-03-06 NOTE — Therapy (Signed)
OUTPATIENT PHYSICAL THERAPY TREATMENT NOTE   Patient Name: Stephanie Sanchez MRN: [id number] DOB:December 22, 1955, 65 y.o., female Today's Date: 03/06/2021  PCP: Crist Infante, MD REFERRING PROVIDER: Crist Infante, MD   PT End of Session - 03/06/21 0905     Visit Number 3    Number of Visits 10    Date for PT Re-Evaluation 03/29/21    Authorization Type MCR    Progress Note Due on Visit 10    PT Start Time 0901    PT Stop Time 0945    PT Time Calculation (min) 44 min    Equipment Utilized During Treatment Other (comment)    Activity Tolerance Patient tolerated treatment well    Behavior During Therapy [facility] for tasks assessed/performed             Past Medical History:  Diagnosis Date   Arthritis    Coronary artery disease involving native coronary artery    cardiologist--- dr h. 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[place])    first dx 2013;  s/p  WLE and Vulvectomy's   PONV (postoperative nausea and vomiting)    Psoriasis    Scoliosis    cervicothoracic region   Past Surgical History:  Procedure Laterality Date   BLADDER SUSPENSION  2008   sling   BREAST EXCISIONAL BIOPSY Left    BUNIONECTOMY  2008   CARDIOVASCULAR STRESS TEST  09-30-2013   dr Marlou Porch   normal nuclear study/  no ischemia/  normal LV function and wall motion,  ef 61%   CATARACT EXTRACTION W/ INTRAOCULAR LENS  IMPLANT, BILATERAL  2016   COLONOSCOPY WITH ESOPHAGOGASTRODUODENOSCOPY (EGD)  04-30-2020  [facility] [place]   HAND SURGERY Right 11/02/12   hand reconstruction at [place] EXCISIONAL BREAST BIOPSY Right 11/09/2018   Procedure: RADIOACTIVE SEED GUIDED EXCISIONAL RIGHT BREAST BIOPSY AND RIGHT AREOLA BIOPSY;  Surgeon: Rolm Bookbinder, MD;  Location: [place];  Service: General;  Laterality: Right;   TONSILLECTOMY  1973  age 40   [place]  age 8   VULVA [place] BIOPSY N/A 08/29/2014   Procedure: Georgena Spurling;  Surgeon: Everitt Amber, MD;  Location: [place];  Service: Gynecology;  Laterality: N/A;   VULVECTOMY N/A 08/29/2014   Procedure: WIDE LOCAL EXCISION OF VULVA;  Surgeon: Everitt Amber, MD;  Location: [place];  Service: Gynecology;  Laterality: N/A;   VULVECTOMY N/A 05/04/2015   Procedure: WIDE LOCAL EXCISION LEFT  VULVAR WITH BIOPSY OF RIGHT VULVA;  Surgeon: Everitt Amber, MD;  Location: [place];  Service: Gynecology;  Laterality: N/A;   VULVECTOMY Left 01/22/2016   Procedure: PARTIAL SIMPLE VULVECTOMY;  Surgeon: Everitt Amber, MD;  Location: [facility];  Service: Gynecology;  Laterality: Left;   VULVECTOMY N/A 02/14/2016   Procedure: WIDE EXCISION VULVECTOMY;  Surgeon: Everitt Amber, MD;  Location: [facility];  Service: Gynecology;  Laterality: N/A;  VULVECTOMY N/A 05/09/2020   Procedure: WIDE LOCAL EXCISION VULVECTOMY;  Surgeon: Everitt Amber, MD;  Location: [facility];  Service: Gynecology;  Laterality: N/A;   Patient Active Problem List   Diagnosis Date Noted   Urge incontinence 06/11/2020   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[place]) 05/09/2020   Encounter for counseling 03/18/2019   Symptomatic mammary hypertrophy 03/18/2019   Neck pain 03/18/2019   Back pain 03/18/2019   Vulval cellulitis 37/90/2409   Lichen simplex chronicus 09/26/2014   Chest pain 10/21/2013   Dyspnea 10/21/2013   GERD (gastroesophageal reflux disease) 10/21/2013   Idiopathic scoliosis 10/21/2013   Personal history of colonic polyps 10/21/2013   Genital  atrophy of female 07/02/2012    REFERRING DIAG: REFERRING DIAG: M41.9 (ICD-10-CM) - Scoliosis, unspecified   THERAPY DIAG:  Abnormal posture  Chronic pain of right knee  Difficulty in walking, not elsewhere classified  Muscle weakness (generalized)  Unsteadiness on feet  Other abnormalities of gait and mobility  PERTINENT HISTORY:  hyperlipidemia, primary hypertension hypertension, headache, dyspnea, GERD, Paget's disease    SUBJECTIVE: No pain. "Doing OK."   OBJECTIVE:    PAIN:  Are you having pain? yes VAS scale: 0/10 Pain location: low back and Rt lateral hip Aggravating factors: walk/stop, standing for a while, very cautious with bending Relieving factors: lay flat, lay on my back with legs up on wall   PRECAUTIONS: None  PATIENT EDUCATION:  Education details: Anatomy of condition, POC, HEP, exercise form/rationale, aquatics, pilates Person educated: Patient Education method: Consulting civil engineer, Demonstration, Tactile cues, Verbal cues, and Handouts Education comprehension: verbalized understanding, returned demonstration, verbal cues required, tactile cues required, and needs further education     HOME EXERCISE PROGRAM: PRI 90-90 hip lift with Rt arm reach   ASSESSMENT:   CLINICAL IMPRESSION: Pt completing pilates exercises submerged in 49ft.    She needs vc and some assist with plank positioning supported by noodle. Pt completes using blue, yellow and then 1 and 2 foam hand buoys in order to see which support allows for best execution. Yellow noodle and 1 foam hand buoy seemed to be best.  She tolerates well without any discomfort. Will progress well with core and lumbopelvic strengthening   Pt has done distance walking for exercise for years but has had a recent increase in Rt hip pain that is limiting PLOF. Began with PRI exercises today for stabilization of curvature and will focus on pilates-style aquatic exercises for lumbopelvic engagement.  GOALS: Goals  reviewed with patient? Yes   SHORT TERM GOALS:   STG Name Target Date Goal status  1 Pt will perform PRI exercise daily for stability to curve Baseline: began at eval 03/11/2021 INITIAL  2 Pt will report reduced spasm in Lt lower back Baseline: significant and frequent at eval 03/11/2021 INITIAL                                                 LONG TERM GOALS:    LTG Name Target Date Goal status  1 Pt will be able to walk at least 4 miles without limitation of hip pain Baseline: pain starts and 3 miles, will need to build endurance once pain is no longer a limiting factor 03/29/21 INITIAL  2 Gross hip strength 5/5 Baseline: see outline 03/29/21 INITIAL  3 Will be able go shopping for at least 1  hour without limitation by hip pain Baseline: stop and go walking around is the worst 03/29/21 INITIAL  4 LEFS to improve by [place] of 9 points Baseline: 59 at eval 03/29/21 INITIAL    PT FREQUENCY: 2x/week   PT DURATION: 6 weeks   PLANNED INTERVENTIONS: Therapeutic exercises, Therapeutic activity, Neuro Muscular re-education, Balance training, Gait training, Patient/Family education, Joint mobilization, Aquatic Therapy, Dry Needling, Moist heat, and Manual therapy      PAIN:  Are you having pain? No VAS scale: 0/10 Pain location: low back and Rt lateral hip Pain orientation:   PAIN TYPE:  Pain description:   Aggravating factors: walk/stop, standing for a while, very cautious with bending Relieving factors: lay flat, lay on my back with legs up on wall  TODAY'S TREATMENT: 03/06/21 Pt seen for aquatic therapy today.  Treatment took place in water 3.25-4.8 ft in depth at the [facility] pool. Temp of water was 91.  Pt entered/exited the pool via stairs step to pattern independently with bilat rail.  Pt entered water for aquatic therapy for first time and was introduced to principles and therapeutic effects of water as she ambulated and acclimated to pool.  Pt ambulates forward  across pool , then backwards to engage posterior chain and then sidesteping x 4 widths ea.    Seated Stretching gastroc, hamstrings and add 3 x 20-25 sec hold Modified Side plank yoga position for left axial stretch Pike position using hand rails for LB and core rotator stretches  Pilates: Chest expansion 3x10 Lat pull x 10 multiple tries to maintain position [place] multiple tries to maintain position.  Supine suspension on water wall core strengthening -abdominal crunch-knees to chest x 10 -knees to R/L shoulder -core rotators x 5 -pike x 7   Pt requires buoyancy for support and to offload joints with strengthening exercises. Viscosity of the water is needed for resistance of strengthening; water current perturbations provides challenge to standing balance unsupported, requiring increased core activation.     03/05/21 Pt seen for aquatic therapy today.  Treatment took place in water 3.25-4.8 ft in depth at the [facility] pool. Temp of water was 91.  Pt entered/exited the pool via stairs step to pattern independently with bilat rail.  Pt entered water for aquatic therapy for first time and was introduced to principles and therapeutic effects of water as she ambulated and acclimated to pool.  Pt ambulates forward across pool , then backwards to engage posterior chain and then sidesteping x 4 widths ea.    Seated Stretching gastroc, hamstrings and add 3 x 20-25 sec hold Modified Side plank yoga position for left axial stretch Pike position using hand rails for LB and core rotator stretches  Bad Ragaz, Pt with lumbar belt around hips and nek doodle for neck support.  .  Pt assisted into supine floating position by lying head on shoulder of PT to get into floating position. . PT at torso and assisting with trunk left to right and vice versa to engage trunk muscles. PT then rotated trunk in order to engage abdomnal (internal and external obliques)  Emphasis on breathing techniques  to draw in abdominals for support.     -Posterior chain Hip extension and knee flexion with water resistance and ankle foam buoys x 10 sec. -Manual right side bending for left axial stretch 3 x 30 sec hold   Pt requires buoyancy for support and to offload joints with strengthening exercises. Viscosity of the water is needed for resistance  of strengthening; water current perturbations provides challenge to standing balance unsupported, requiring increased core activation.   PATIENT EDUCATION: Properties of water; benefits of aquatic therapy  PLAN FOR NEXT SESSION: pilates, core   Stephanie Kidney (Frankie) Stephanie Sanchez MPT 03/06/2021, 2:04 PM

## 2021-03-08 ENCOUNTER — Ambulatory Visit (HOSPITAL_BASED_OUTPATIENT_CLINIC_OR_DEPARTMENT_OTHER): Payer: Medicare Other | Admitting: Physical Therapy

## 2021-03-12 ENCOUNTER — Other Ambulatory Visit: Payer: Self-pay

## 2021-03-12 ENCOUNTER — Encounter (HOSPITAL_BASED_OUTPATIENT_CLINIC_OR_DEPARTMENT_OTHER): Payer: Self-pay | Admitting: Physical Therapy

## 2021-03-12 ENCOUNTER — Ambulatory Visit (HOSPITAL_BASED_OUTPATIENT_CLINIC_OR_DEPARTMENT_OTHER): Payer: Medicare Other | Admitting: Physical Therapy

## 2021-03-12 DIAGNOSIS — R2689 Other abnormalities of gait and mobility: Secondary | ICD-10-CM

## 2021-03-12 DIAGNOSIS — R293 Abnormal posture: Secondary | ICD-10-CM

## 2021-03-12 DIAGNOSIS — G8929 Other chronic pain: Secondary | ICD-10-CM

## 2021-03-12 DIAGNOSIS — R262 Difficulty in walking, not elsewhere classified: Secondary | ICD-10-CM

## 2021-03-12 DIAGNOSIS — R2681 Unsteadiness on feet: Secondary | ICD-10-CM

## 2021-03-12 DIAGNOSIS — M6281 Muscle weakness (generalized): Secondary | ICD-10-CM

## 2021-03-12 NOTE — Therapy (Signed)
OUTPATIENT PHYSICAL THERAPY TREATMENT NOTE   Patient Name: Stephanie Sanchez MRN: [id number] DOB:[birth year], 65 y.o., female Today's Date: 03/12/2021  PCP: Crist Infante, MD REFERRING PROVIDER: Crist Infante, MD   PT End of Session - 03/12/21 1031     Visit Number 4    Number of Visits 10    Date for PT Re-Evaluation 03/29/21    Authorization Type MCR    Progress Note Due on Visit 10    PT Start Time 1030    PT Stop Time 1115    PT Time Calculation (min) 45 min    Equipment Utilized During Treatment Other (comment)    Activity Tolerance Patient tolerated treatment well    Behavior During Therapy [facility] for tasks assessed/performed             Past Medical History:  Diagnosis Date   Arthritis    Coronary artery disease involving native coronary artery    cardiologist--- dr h. 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[place])    first dx 2013;  s/p  WLE and Vulvectomy's   PONV (postoperative nausea and vomiting)    Psoriasis    Scoliosis    cervicothoracic region   Past Surgical History:  Procedure Laterality Date   BLADDER SUSPENSION  2008   sling   BREAST EXCISIONAL BIOPSY Left    BUNIONECTOMY  2008   CARDIOVASCULAR STRESS TEST  09-30-2013   dr Marlou Porch   normal nuclear study/  no ischemia/  normal LV function and wall motion,  ef 61%   CATARACT EXTRACTION W/ INTRAOCULAR LENS  IMPLANT, BILATERAL  2016   COLONOSCOPY WITH ESOPHAGOGASTRODUODENOSCOPY (EGD)  04-30-2020  [facility] [place]   HAND SURGERY Right 11/02/12   hand reconstruction at [facility] EXCISIONAL BREAST BIOPSY Right 11/09/2018   Procedure: RADIOACTIVE SEED GUIDED EXCISIONAL RIGHT BREAST BIOPSY AND RIGHT AREOLA BIOPSY;  Surgeon: Rolm Bookbinder, MD;  Location: [place];  Service: General;  Laterality: Right;   TONSILLECTOMY  1973  age 19   [place]  age 9   VULVA [place] BIOPSY N/A 08/29/2014   Procedure: Georgena Spurling;  Surgeon: Everitt Amber, MD;  Location: [place];  Service: Gynecology;  Laterality: N/A;   VULVECTOMY N/A 08/29/2014   Procedure: WIDE LOCAL EXCISION OF VULVA;  Surgeon: Everitt Amber, MD;  Location: [place];  Service: Gynecology;  Laterality: N/A;   VULVECTOMY N/A 05/04/2015   Procedure: WIDE LOCAL EXCISION LEFT  VULVAR WITH BIOPSY OF RIGHT VULVA;  Surgeon: Everitt Amber, MD;  Location: [place];  Service: Gynecology;  Laterality: N/A;   VULVECTOMY Left 01/22/2016   Procedure: PARTIAL SIMPLE VULVECTOMY;  Surgeon: Everitt Amber, MD;  Location: [facility];  Service: Gynecology;  Laterality: Left;   VULVECTOMY N/A 02/14/2016   Procedure: WIDE EXCISION VULVECTOMY;  Surgeon: Everitt Amber, MD;  Location: [facility];  Service: Gynecology;  Laterality: N/A;  VULVECTOMY N/A 05/09/2020   Procedure: WIDE LOCAL EXCISION VULVECTOMY;  Surgeon: Everitt Amber, MD;  Location: [facility];  Service: Gynecology;  Laterality: N/A;   Patient Active Problem List   Diagnosis Date Noted   Urge incontinence 06/11/2020   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[place]) 05/09/2020   Encounter for counseling 03/18/2019   Symptomatic mammary hypertrophy 03/18/2019   Neck pain 03/18/2019   Back pain 03/18/2019   Vulval cellulitis 78/58/8502   Lichen simplex chronicus 09/26/2014   Chest pain 10/21/2013   Dyspnea 10/21/2013   GERD (gastroesophageal reflux disease) 10/21/2013   Idiopathic scoliosis 10/21/2013   Personal history of colonic polyps 10/21/2013   Genital  atrophy of female 07/02/2012    REFERRING DIAG: REFERRING DIAG: M41.9 (ICD-10-CM) - Scoliosis, unspecified   THERAPY DIAG:  Abnormal posture  Chronic pain of right knee  Difficulty in walking, not elsewhere classified  Muscle weakness (generalized)  Unsteadiness on feet  Other abnormalities of gait and mobility  PERTINENT HISTORY:  hyperlipidemia, primary hypertension hypertension, headache, dyspnea, GERD, Paget's disease    SUBJECTIVE: No adverse issues after last treatment"   OBJECTIVE:    PAIN:  Are you having pain? yes VAS scale: 0/10 Pain location: low back and Rt lateral hip Aggravating factors: walk/stop, standing for a while, very cautious with bending Relieving factors: lay flat, lay on my back with legs up on wall   PRECAUTIONS: None  PATIENT EDUCATION:  Education details: Anatomy of condition, POC, HEP, exercise form/rationale, aquatics, pilates Person educated: Patient Education method: Consulting civil engineer, Demonstration, Tactile cues, Verbal cues, and Handouts Education comprehension: verbalized understanding, returned demonstration, verbal cues required, tactile cues required, and needs further education     HOME EXERCISE PROGRAM: PRI 90-90 hip lift with Rt arm reach   ASSESSMENT:   CLINICAL IMPRESSION: No c/o discomfort after last treatment.  Able to progress Pilates. She continues to have difficulty holding plank position. Moved treatment into deep end using hand buoys. With vc and demonstration pt able to gain good core activation with suspended prone and supine positions. After several tries pt able to gain positions indep. Very good axial core side stretching with side/side pendulum. Added crunches assisting pt with sup positioning for improved execution. Pt reporting feeling exercised and stretched upon completion      GOALS: Goals reviewed with patient? Yes   SHORT TERM GOALS:   STG Name Target Date Goal status  1 Pt will perform PRI exercise  daily for stability to curve Baseline: began at eval 03/11/2021 INITIAL  2 Pt will report reduced spasm in Lt lower back Baseline: significant and frequent at eval 03/11/2021 INITIAL                                                 LONG TERM GOALS:    LTG Name Target Date Goal status  1 Pt will be able to walk at least 4 miles without limitation of hip pain Baseline: pain starts and 3 miles, will need to build endurance once pain is no longer a limiting factor 03/29/21 INITIAL  2 Gross hip strength 5/5 Baseline: see outline 03/29/21 INITIAL  3 Will be able go shopping for at least 1 hour without limitation by hip pain Baseline: stop and go walking around is the worst 03/29/21 INITIAL  4 LEFS to improve by [place] of 9 points Baseline: 59  at eval 03/29/21 INITIAL    PT FREQUENCY: 2x/week   PT DURATION: 6 weeks   PLANNED INTERVENTIONS: Therapeutic exercises, Therapeutic activity, Neuro Muscular re-education, Balance training, Gait training, Patient/Family education, Joint mobilization, Aquatic Therapy, Dry Needling, Moist heat, and Manual therapy      PAIN:  Are you having pain? No VAS scale: 0/10 Pain location: low back and Rt lateral hip Pain orientation:   PAIN TYPE:  Pain description:   Aggravating factors: walk/stop, standing for a while, very cautious with bending Relieving factors: lay flat, lay on my back with legs up on wall  TODAY'S TREATMENT: 03/06/21 Pt seen for aquatic therapy today.  Treatment took place in water 3.25-4.8 ft in depth at the [facility] pool. Temp of water was 91.  Pt entered/exited the pool via stairs step to pattern independently with bilat rail.  Pt entered water for aquatic therapy for first time and was introduced to principles and therapeutic effects of water as she ambulated and acclimated to pool.  Pt ambulates forward across pool , then backwards to engage posterior chain and then sidesteping x 4 widths ea.    Seated Stretching  gastroc, hamstrings and add 3 x 20-25 sec hold Pike position using hand rails for LB and core rotator stretches  Pilates: Chest expansion 3x10 Lat pull 2 x 10 Plank multiple tries to maintain position challenged hold x 10 seconds  Supine suspension using yellow hand buoys -vertical to prone x4 hold x 10-15 seconds.  Multiple tries to gain position. -vertical to supine -abdominal crunch-knees to chest 2 x 10 -side to side pendulum. Holding position for lateral core stretch R/L. Then strengthening 2 x 5reps  Pt requires buoyancy for support and to offload joints with strengthening exercises. Viscosity of the water is needed for resistance of strengthening; water current perturbations provides challenge to standing balance unsupported, requiring increased core activation.     03/05/21 Pt seen for aquatic therapy today.  Treatment took place in water 3.25-4.8 ft in depth at the [facility] pool. Temp of water was 91.  Pt entered/exited the pool via stairs step to pattern independently with bilat rail.  Pt entered water for aquatic therapy for first time and was introduced to principles and therapeutic effects of water as she ambulated and acclimated to pool.  Pt ambulates forward across pool , then backwards to engage posterior chain and then sidesteping x 4 widths ea.    Seated Stretching gastroc, hamstrings and add 3 x 20-25 sec hold Modified Side plank yoga position for left axial stretch Pike position using hand rails for LB and core rotator stretches   Forward and backward pendul Side to side pend Knees to chest 2 x 10 Side pendulum stre   Pt requires buoyancy for support and to offload joints with strengthening exercises. Viscosity of the water is needed for resistance of strengthening; water current perturbations provides challenge to standing balance unsupported, requiring increased core activation.   PATIENT EDUCATION: Properties of water; benefits of aquatic  therapy  PLAN FOR NEXT SESSION: pilates, core   Stanton Kidney ([place]ie) Ryaan Vanwagoner MPT 03/12/2021, 8:24 PM

## 2021-03-15 ENCOUNTER — Ambulatory Visit (HOSPITAL_BASED_OUTPATIENT_CLINIC_OR_DEPARTMENT_OTHER): Payer: Medicare Other | Admitting: Physical Therapy

## 2021-03-15 ENCOUNTER — Other Ambulatory Visit: Payer: Self-pay

## 2021-03-15 ENCOUNTER — Encounter (HOSPITAL_BASED_OUTPATIENT_CLINIC_OR_DEPARTMENT_OTHER): Payer: Self-pay | Admitting: Physical Therapy

## 2021-03-15 DIAGNOSIS — R293 Abnormal posture: Secondary | ICD-10-CM | POA: Diagnosis not present

## 2021-03-15 DIAGNOSIS — G8929 Other chronic pain: Secondary | ICD-10-CM

## 2021-03-15 DIAGNOSIS — M6281 Muscle weakness (generalized): Secondary | ICD-10-CM

## 2021-03-15 DIAGNOSIS — R262 Difficulty in walking, not elsewhere classified: Secondary | ICD-10-CM

## 2021-03-15 NOTE — Therapy (Signed)
OUTPATIENT PHYSICAL THERAPY TREATMENT NOTE   Patient Name: Stephanie Sanchez MRN: [id number] DOB:15-Sep-1955, 65 y.o., female Today's Date: 03/15/2021  PCP: Crist Infante, MD REFERRING PROVIDER: Crist Infante, MD   PT End of Session - 03/15/21 1437     Visit Number 5    Number of Visits 10    Date for PT Re-Evaluation 03/29/21    Authorization Type MCR    Progress Note Due on Visit 10    PT Start Time 1315    PT Stop Time 1400    PT Time Calculation (min) 45 min    Activity Tolerance Patient tolerated treatment well    Behavior During Therapy [facility] for tasks assessed/performed              Past Medical History:  Diagnosis Date   Arthritis    Coronary artery disease involving native coronary artery    cardiologist--- dr h. 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[place])    first dx 2013;  s/p  WLE and Vulvectomy's   PONV (postoperative nausea and vomiting)    Psoriasis    Scoliosis    cervicothoracic region   Past Surgical History:  Procedure Laterality Date   BLADDER SUSPENSION  2008   sling   BREAST EXCISIONAL BIOPSY Left    BUNIONECTOMY  2008   CARDIOVASCULAR STRESS TEST  09-30-2013   dr Marlou Porch   normal nuclear study/  no ischemia/  normal LV function and wall motion,  ef 61%   CATARACT EXTRACTION W/ INTRAOCULAR LENS  IMPLANT, BILATERAL  2016   COLONOSCOPY WITH ESOPHAGOGASTRODUODENOSCOPY (EGD)  04-30-2020  [facility] in [place]   HAND SURGERY Right 11/02/12   hand reconstruction at [place] EXCISIONAL BREAST BIOPSY Right 11/09/2018    Procedure: RADIOACTIVE SEED GUIDED EXCISIONAL RIGHT BREAST BIOPSY AND RIGHT AREOLA BIOPSY;  Surgeon: Rolm Bookbinder, MD;  Location: [place];  Service: General;  Laterality: Right;   TONSILLECTOMY  1973  age 58   [place]  age 45   VULVA [place] BIOPSY N/A 08/29/2014   Procedure: Georgena Spurling;  Surgeon: Everitt Amber, MD;  Location: [place];  Service: Gynecology;  Laterality: N/A;   VULVECTOMY N/A 08/29/2014   Procedure: WIDE LOCAL EXCISION OF VULVA;  Surgeon: Everitt Amber, MD;  Location: [place];  Service: Gynecology;  Laterality: N/A;   VULVECTOMY N/A 05/04/2015   Procedure: WIDE LOCAL EXCISION LEFT  VULVAR WITH BIOPSY OF RIGHT VULVA;  Surgeon: Everitt Amber, MD;  Location: [place];  Service: Gynecology;  Laterality: N/A;   VULVECTOMY Left 01/22/2016   Procedure: PARTIAL SIMPLE VULVECTOMY;  Surgeon: Everitt Amber, MD;  Location: [facility];  Service: Gynecology;  Laterality: Left;   VULVECTOMY N/A 02/14/2016   Procedure: WIDE EXCISION VULVECTOMY;  Surgeon: Everitt Amber, MD;  Location: [facility];  Service: Gynecology;  Laterality: N/A;   VULVECTOMY N/A 05/09/2020   Procedure: WIDE  LOCAL EXCISION VULVECTOMY;  Surgeon: Everitt Amber, MD;  Location: [facility];  Service: Gynecology;  Laterality: N/A;   Patient Active Problem List   Diagnosis Date Noted   Urge incontinence 06/11/2020   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[place]) 05/09/2020   Encounter for counseling 03/18/2019   Symptomatic mammary hypertrophy 03/18/2019   Neck pain 03/18/2019   Back pain 03/18/2019   Vulval cellulitis 33/54/5625   Lichen simplex chronicus 09/26/2014   Chest pain 10/21/2013   Dyspnea 10/21/2013   GERD (gastroesophageal reflux disease) 10/21/2013   Idiopathic scoliosis 10/21/2013   Personal history of colonic polyps 10/21/2013   Genital atrophy of female 07/02/2012    REFERRING DIAG:  REFERRING DIAG: M41.9 (ICD-10-CM) - Scoliosis, unspecified   THERAPY DIAG:  Abnormal posture  Chronic pain of right knee  Difficulty in walking, not elsewhere classified  Muscle weakness (generalized)  PERTINENT HISTORY:  hyperlipidemia, primary hypertension hypertension, headache, dyspnea, GERD, Paget's disease    SUBJECTIVE: no soreness. I haven't been walking as much but I still feel the pain at about the 4-5 mile range, not as bad.    OBJECTIVE:    PAIN:  Are you having pain? yes VAS scale: 0/10 Pain location: low back and Rt lateral hip Aggravating factors: walk/stop, standing for a while, very cautious with bending Relieving factors: lay flat, lay on my back with legs up on wall   PRECAUTIONS: None  PATIENT EDUCATION:  Education details: Geophysicist/field seismologist of condition, POC, HEP, exercise form/rationale, aquatics, pilates Person educated: Patient Education method: Consulting civil engineer, Demonstration, Tactile cues, Verbal cues, and Handouts Education comprehension: verbalized understanding, returned demonstration, verbal cues required, tactile cues required, and needs further education DIAGNOSTIC FINDINGS:  MRI from 2017 1- mulitlevel degen changes with a mod retrolevoscoliosis of lumbar spine 2-L2-3 mild mod Rt lateral recess stenosis with possible encroachment of Rt L3 root 3- L3-4 mild to mod central canal stenosis and bil lateral recess stenosis with possible encroachment on L4 roots 4-L4-5 mod to severe Lt lateral recess stenosis with probably encroachment on Lt L5 root. Mod narrowing of Lt neural foramen with mass effect on the Lt L4 dorsal root ganglion. 5-L5-S1 abnormal contact bw the transverse process of L5 and Lt sacral ala related scoliosis. 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 No neural displacement or encrachment.    PATIENT SURVEYS:  LEFS 59   COGNITION:          Overall cognitive status: Within functional limits for tasks assessed                         SENSATION:          WFL   MUSCLE LENGTH: WFL   POSTURE:  Lumbar levoscoliosis, thoraco dextroscoliosis, Rt hip elevation   LUMBARAROM/PROM   General mobility is [facility] but has increased flexibility grossly   LE MMT: whole body compensation with MMT to RLE   A/PROM Right 02/18/2021 Left 02/18/2021 Right  11/11  Hip flexion 4  5 4   Hip extension       Hip abduction 4- 5 4+  Hip adduction       Hip internal rotation       Hip external rotation       Knee flexion       Knee extension       Ankle dorsiflexion       Ankle plantarflexion       Ankle inversion       Ankle  eversion        (Blank rows = not tested)   GAIT: [facility]      HOME EXERCISE PROGRAM: PRI 90-90 hip lift with Rt arm reach, Lt sidelying knee toward knee [id number]   ASSESSMENT:   CLINICAL IMPRESSION: Pt demo improved strength but still having pain in hip. We did discuss input of anatomical structures but will continue to work on strengthening. Exercises focused on core activation- tends to lean back in Rt heel strike which activates QL, also worked on activation of Rt hip abd group for support.       GOALS: Goals reviewed with patient? Yes   SHORT TERM GOALS:   STG Name Target Date Goal status  1 Pt will perform PRI exercise daily for stability to curve Baseline: feel like I am awful at it 03/11/2021 Partially met  2 Pt will report reduced spasm in Lt lower back Baseline: started today after cooking 03/11/2021 ongoing                                                 LONG TERM GOALS:    LTG Name Target Date Goal status  1 Pt will be able to walk at least 4 miles without limitation of hip pain Baseline: pain starts and 3 miles, will need to build endurance once pain is no longer a limiting factor 03/29/21 INITIAL  2 Gross hip strength 5/5 Baseline: see outline 03/29/21 INITIAL  3 Will be able go shopping for at least 1 hour without limitation by hip pain Baseline: stop and go walking around is  the worst 03/29/21 INITIAL  4 LEFS to improve by [place] of 9 points Baseline: 59 at eval 03/29/21 INITIAL    PT FREQUENCY: 2x/week   PT DURATION: 6 weeks   PLANNED INTERVENTIONS: Therapeutic exercises, Therapeutic activity, Neuro Muscular re-education, Balance training, Gait training, Patient/Family education, Joint mobilization, Aquatic Therapy, Dry Needling, Moist heat, and Manual therapy      PAIN:  Are you having pain? No VAS scale: 0/10 Pain location: low back and Rt lateral hip Pain orientation:   PAIN TYPE:  Pain description:   Aggravating factors: walk/stop, standing for a while, very cautious with bending Relieving factors: lay flat, lay on my back with legs up on wall  TODAY'S TREATMENT: 11/11  Supine ab set with Rt arm reach  PRI Lt sidelying knee toward knee- adapted to not have wall  Side plank elbow and knee  SLS for Rt hip abd activation- transitioned into gait pattern  Planks  Manual- STM to thoraco lumabr paraspinals and QL bil  03/06/21 Pt seen for aquatic therapy today.  Treatment took place in water 3.25-4.8 ft in depth at the [facility] pool. Temp of water was 91.  Pt entered/exited the pool via stairs step to pattern independently with bilat rail.  Pt entered water for aquatic therapy for first time and was introduced to principles and therapeutic effects of water as she ambulated and acclimated to pool.  Pt ambulates forward across pool , then backwards to engage posterior chain and then sidesteping x 4 widths ea.    Seated Stretching gastroc, hamstrings and add 3 x 20-25 sec hold Pike position using hand rails for LB and core rotator stretches  Pilates: Chest expansion 3x10 Lat pull 2 x 10 Plank multiple tries to maintain position challenged hold  x 10 seconds  Supine suspension using yellow hand buoys -vertical to prone x4 hold x 10-15 seconds.  Multiple tries to gain position. -vertical to supine -abdominal crunch-knees to chest 2 x  10 -side to side pendulum. Holding position for lateral core stretch R/L. Then strengthening 2 x 5reps  Pt requires buoyancy for support and to offload joints with strengthening exercises. Viscosity of the water is needed for resistance of strengthening; water current perturbations provides challenge to standing balance unsupported, requiring increased core activation.     03/05/21 Pt seen for aquatic therapy today.  Treatment took place in water 3.25-4.8 ft in depth at the [facility] pool. Temp of water was 91.  Pt entered/exited the pool via stairs step to pattern independently with bilat rail.  Pt entered water for aquatic therapy for first time and was introduced to principles and therapeutic effects of water as she ambulated and acclimated to pool.  Pt ambulates forward across pool , then backwards to engage posterior chain and then sidesteping x 4 widths ea.    Seated Stretching gastroc, hamstrings and add 3 x 20-25 sec hold Modified Side plank yoga position for left axial stretch Pike position using hand rails for LB and core rotator stretches   Forward and backward pendul Side to side pend Knees to chest 2 x 10 Side pendulum stre   Pt requires buoyancy for support and to offload joints with strengthening exercises. Viscosity of the water is needed for resistance of strengthening; water current perturbations provides challenge to standing balance unsupported, requiring increased core activation.   PATIENT EDUCATION: Properties of water; benefits of aquatic therapy  PLAN FOR NEXT SESSION: re-cert to extend  [place]. [place] Current PT, DPT 03/15/21 2:45 PM

## 2021-03-18 ENCOUNTER — Ambulatory Visit (HOSPITAL_BASED_OUTPATIENT_CLINIC_OR_DEPARTMENT_OTHER): Payer: Medicare Other | Admitting: Physical Therapy

## 2021-03-19 ENCOUNTER — Encounter (HOSPITAL_BASED_OUTPATIENT_CLINIC_OR_DEPARTMENT_OTHER): Payer: Self-pay | Admitting: Physical Therapy

## 2021-03-19 ENCOUNTER — Ambulatory Visit (HOSPITAL_BASED_OUTPATIENT_CLINIC_OR_DEPARTMENT_OTHER): Payer: Medicare Other | Admitting: Physical Therapy

## 2021-03-19 ENCOUNTER — Other Ambulatory Visit: Payer: Self-pay

## 2021-03-19 DIAGNOSIS — R293 Abnormal posture: Secondary | ICD-10-CM | POA: Diagnosis not present

## 2021-03-19 DIAGNOSIS — R262 Difficulty in walking, not elsewhere classified: Secondary | ICD-10-CM

## 2021-03-19 DIAGNOSIS — G8929 Other chronic pain: Secondary | ICD-10-CM

## 2021-03-19 NOTE — Therapy (Signed)
OUTPATIENT PHYSICAL THERAPY TREATMENT NOTE   Patient Name: Stephanie Sanchez MRN: [id number] DOB:04-04-1956, 65 y.o., female Today's Date: 03/19/2021  PCP: Crist Infante, MD REFERRING PROVIDER: Crist Infante, MD   PT End of Session - 03/19/21 1234     Visit Number 6    Number of Visits 10    Date for PT Re-Evaluation 03/29/21    Authorization Type MCR    Progress Note Due on Visit 10    PT Start Time 25              Past Medical History:  Diagnosis Date   Arthritis    Coronary artery disease involving native coronary artery    cardiologist--- dr h. 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[place])    first dx 2013;  s/p  WLE and Vulvectomy's   PONV (postoperative nausea and vomiting)    Psoriasis    Scoliosis    cervicothoracic region   Past Surgical History:  Procedure Laterality Date   BLADDER SUSPENSION  2008   sling   BREAST EXCISIONAL BIOPSY Left    BUNIONECTOMY  2008   CARDIOVASCULAR STRESS TEST  09-30-2013   dr Marlou Porch   normal nuclear study/  no ischemia/  normal LV function and wall motion,  ef 61%   CATARACT EXTRACTION W/ INTRAOCULAR LENS  IMPLANT, BILATERAL  2016   COLONOSCOPY WITH ESOPHAGOGASTRODUODENOSCOPY (EGD)  04-30-2020  [facility] [place]   HAND SURGERY Right 11/02/12   hand reconstruction at [place] EXCISIONAL BREAST BIOPSY Right 11/09/2018   Procedure: RADIOACTIVE SEED GUIDED EXCISIONAL RIGHT BREAST BIOPSY AND RIGHT AREOLA BIOPSY;  Surgeon: Rolm Bookbinder, MD;  Location: [place];  Service: General;   Laterality: Right;   TONSILLECTOMY  1973  age 52   [place]  age 5   VULVA [place] BIOPSY N/A 08/29/2014   Procedure: Georgena Spurling;  Surgeon: Everitt Amber, MD;  Location: [place];  Service: Gynecology;  Laterality: N/A;   VULVECTOMY N/A 08/29/2014   Procedure: WIDE LOCAL EXCISION OF VULVA;  Surgeon: Everitt Amber, MD;  Location: [place];  Service: Gynecology;  Laterality: N/A;   VULVECTOMY N/A 05/04/2015   Procedure: WIDE LOCAL EXCISION LEFT  VULVAR WITH BIOPSY OF RIGHT VULVA;  Surgeon: Everitt Amber, MD;  Location: [place];  Service: Gynecology;  Laterality: N/A;   VULVECTOMY Left 01/22/2016   Procedure: PARTIAL SIMPLE VULVECTOMY;  Surgeon: Everitt Amber, MD;  Location: [facility];  Service: Gynecology;  Laterality: Left;   VULVECTOMY N/A 02/14/2016   Procedure: WIDE EXCISION VULVECTOMY;  Surgeon: Everitt Amber, MD;  Location: [place];  Service: Gynecology;  Laterality: N/A;   VULVECTOMY N/A 05/09/2020   Procedure: WIDE LOCAL EXCISION VULVECTOMY;  Surgeon: Everitt Amber, MD;  Location: [facility];  Service: Gynecology;  Laterality: N/A;   Patient Active Problem List   Diagnosis Date Noted   Urge incontinence  06/11/2020   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[facility]) 05/09/2020   Encounter for counseling 03/18/2019   Symptomatic mammary hypertrophy 03/18/2019   Neck pain 03/18/2019   Back pain 03/18/2019   Vulval cellulitis 02/27/2016   Lichen simplex chronicus 09/26/2014   Chest pain 10/21/2013   Dyspnea 10/21/2013   GERD (gastroesophageal reflux disease) 10/21/2013   Idiopathic scoliosis 10/21/2013   Personal history of colonic polyps 10/21/2013   Genital atrophy of female 07/02/2012    REFERRING DIAG: REFERRING DIAG: M41.9 (ICD-10-CM) - Scoliosis, unspecified   THERAPY DIAG:  Abnormal posture  Chronic pain of right knee  Difficulty in walking, not elsewhere  classified  PERTINENT HISTORY:  hyperlipidemia, primary hypertension hypertension, headache, dyspnea, GERD, Paget's disease    SUBJECTIVE: mild soreness in Lt lower back after last session   OBJECTIVE:    PAIN:  Are you having pain? yes VAS scale: 0/10 Pain location: low back and Rt lateral hip Aggravating factors: walk/stop, standing for a while, very cautious with bending Relieving factors: lay flat, lay on my back with legs up on wall   PRECAUTIONS: None  PATIENT EDUCATION:  Education details: progression of scoliosis & use of exercises to support Person educated: Patient Education method: Explanation, Demonstration, Tactile cues, Verbal cues, and Handouts Education comprehension: verbalized understanding, returned demonstration, verbal cues required, tactile cues required, and needs further education  DIAGNOSTIC FINDINGS:  MRI from 2017 1- mulitlevel degen changes with a mod retrolevoscoliosis of lumbar spine 2-L2-3 mild mod Rt lateral recess stenosis with possible encroachment of Rt L3 root 3- L3-4 mild to mod central canal stenosis and bil lateral recess stenosis with possible encroachment on L4 roots 4-L4-5 mod to severe Lt lateral recess stenosis with probably encroachment on Lt L5 root. Mod narrowing of Lt neural foramen with mass effect on the Lt L4 dorsal root ganglion. 5-L5-S1 abnormal contact bw the transverse process of L5 and Lt sacral ala related scoliosis. 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 No neural displacement or encrachment.    PATIENT SURVEYS:  LEFS 59   COGNITION:          Overall cognitive status: Within functional limits for tasks assessed                        SENSATION:          WFL   MUSCLE LENGTH: WFL   POSTURE:  Lumbar levoscoliosis, thoraco dextroscoliosis, Rt hip elevation   LUMBARAROM/PROM   General mobility is WFL but has increased flexibility grossly   LE MMT: whole body compensation with MMT to RLE   A/PROM  Right 02/18/2021 Left 02/18/2021 Right  11/11  Hip flexion 4  5 4  Hip extension       Hip abduction 4- 5 4+  Hip adduction       Hip internal rotation       Hip external rotation       Knee flexion       Knee extension       Ankle dorsiflexion       Ankle plantarflexion       Ankle inversion       Ankle eversion        (Blank rows = not tested)   GAIT: WFL      HOME EXERCISE PROGRAM: PRI 90-90 hip lift with Rt arm reach, Lt sidelying knee toward knee, Standing unsupported right lift with right trunk rotation [id number]   ASSESSMENT:   CLINICAL   IMPRESSION: Continued with PRI exercises for stabilization of scoliotic curve. Pt enjoys pilates so exercises were introduced utilizing pilates forms. Pt is currently being worked up regarding possible flare of Padgets disease and will have to place therapy on hold if further surgical intervention is required.       GOALS: Goals reviewed with patient? Yes   SHORT TERM GOALS:   STG Name Target Date Goal status  1 Pt will perform PRI exercise daily for stability to curve Baseline: feel like I am awful at it- awkward 03/11/2021 Partially met  2 Pt will report reduced spasm in Lt lower back Baseline: started today after cooking 03/11/2021 ongoing                                                 LONG TERM GOALS:    LTG Name Target Date Goal status  1 Pt will be able to walk at least 4 miles without limitation of hip pain Baseline: begins around that mark 04/19/21 ongoing  2 Gross hip strength 5/5 Baseline: see outline 04/19/21 INITIAL  3 Will be able go shopping for at least 1 hour without limitation by hip pain Baseline: stop and go walking around is the worst 04/19/21 INITIAL  4 LEFS to improve by MDC of 9 points Baseline: 59 at eval 04/19/21 INITIAL    PT FREQUENCY: 2x/week   PT DURATION: 6 weeks   PLANNED INTERVENTIONS: Therapeutic exercises, Therapeutic activity, Neuro Muscular re-education, Balance training, Gait  training, Patient/Family education, Joint mobilization, Aquatic Therapy, Dry Needling, Moist heat, and Manual therapy      PAIN:  Are you having pain? No VAS scale: 0/10 Pain location: low back and Rt lateral hip Pain orientation:   PAIN TYPE:  Pain description:   Aggravating factors: walk/stop, standing for a while, very cautious with bending Relieving factors: lay flat, lay on my back with legs up on wall  TODAY'S TREATMENT: 11/15  PRI: Lt sidelying knee toward knee, Standing unsupported right lift with right trunk rotation  Supine 90/90 with diaphragmatic breathing- anchor rib flare, breathing through upper rib cage with relaxed cervical region  Supine 90/90 with shoulder flexion  Quadruped Lt arm/Rt leg bird dog  11/11  Supine ab set with Rt arm reach  PRI Lt sidelying knee toward knee- adapted to not have wall  Side plank elbow and knee  SLS for Rt hip abd activation- transitioned into gait pattern  Planks  Manual- STM to thoraco lumabr paraspinals and QL bil  03/06/21 Pt seen for aquatic therapy today.  Treatment took place in water 3.25-4.8 ft in depth at the [facility] pool. Temp of water was 91.  Pt entered/exited the pool via stairs step to pattern independently with bilat rail.  Pt entered water for aquatic therapy for first time and was introduced to principles and therapeutic effects of water as she ambulated and acclimated to pool.  Pt ambulates forward across pool , then backwards to engage posterior chain and then sidesteping x 4 widths ea.    Seated Stretching gastroc, hamstrings and add 3 x 20-25 sec hold Pike position using hand rails for LB and core rotator stretches  Pilates: Chest expansion 3x10 Lat pull 2 x 10 Plank multiple tries to maintain position challenged hold x 10 seconds  Supine suspension using yellow hand buoys -vertical to prone x4 hold x   10-15 seconds.  Multiple tries to gain position. -vertical to supine -abdominal  crunch-knees to chest 2 x 10 -side to side pendulum. Holding position for lateral core stretch R/L. Then strengthening 2 x 5reps  Pt requires buoyancy for support and to offload joints with strengthening exercises. Viscosity of the water is needed for resistance of strengthening; water current perturbations provides challenge to standing balance unsupported, requiring increased core activation.     03/05/21 Pt seen for aquatic therapy today.  Treatment took place in water 3.25-4.8 ft in depth at the [facility] pool. Temp of water was 91.  Pt entered/exited the pool via stairs step to pattern independently with bilat rail.  Pt entered water for aquatic therapy for first time and was introduced to principles and therapeutic effects of water as she ambulated and acclimated to pool.  Pt ambulates forward across pool , then backwards to engage posterior chain and then sidesteping x 4 widths ea.    Seated Stretching gastroc, hamstrings and add 3 x 20-25 sec hold Modified Side plank yoga position for left axial stretch Pike position using hand rails for LB and core rotator stretches   Forward and backward pendul Side to side pend Knees to chest 2 x 10 Side pendulum stre   Pt requires buoyancy for support and to offload joints with strengthening exercises. Viscosity of the water is needed for resistance of strengthening; water current perturbations provides challenge to standing balance unsupported, requiring increased core activation.     PLAN FOR NEXT SESSION: aquatics- pilates, planks; focusing on Lt rib flare reduction, Rt QL stretching & inhibition  Jessica C. Hightower PT, DPT 03/19/21 12:35 PM     

## 2021-03-21 ENCOUNTER — Ambulatory Visit (HOSPITAL_BASED_OUTPATIENT_CLINIC_OR_DEPARTMENT_OTHER): Payer: Medicare Other | Admitting: Physical Therapy

## 2021-03-25 ENCOUNTER — Encounter (HOSPITAL_BASED_OUTPATIENT_CLINIC_OR_DEPARTMENT_OTHER): Payer: Self-pay | Admitting: Physical Therapy

## 2021-03-25 ENCOUNTER — Other Ambulatory Visit: Payer: Self-pay

## 2021-03-25 ENCOUNTER — Ambulatory Visit (HOSPITAL_BASED_OUTPATIENT_CLINIC_OR_DEPARTMENT_OTHER): Payer: Medicare Other | Admitting: Physical Therapy

## 2021-03-25 DIAGNOSIS — R293 Abnormal posture: Secondary | ICD-10-CM

## 2021-03-25 DIAGNOSIS — R2689 Other abnormalities of gait and mobility: Secondary | ICD-10-CM

## 2021-03-25 DIAGNOSIS — R262 Difficulty in walking, not elsewhere classified: Secondary | ICD-10-CM

## 2021-03-25 DIAGNOSIS — M6281 Muscle weakness (generalized): Secondary | ICD-10-CM

## 2021-03-25 DIAGNOSIS — G8929 Other chronic pain: Secondary | ICD-10-CM

## 2021-03-25 DIAGNOSIS — R2681 Unsteadiness on feet: Secondary | ICD-10-CM

## 2021-03-25 NOTE — Therapy (Signed)
OUTPATIENT PHYSICAL THERAPY TREATMENT NOTE   Patient Name: Stephanie Sanchez MRN: [id number] DOB:May 22, 1955, 65 y.o., female Today's Date: 03/25/2021  PCP: Crist Infante, MD REFERRING PROVIDER: Crist Infante, MD   PT End of Session - 03/25/21 1021     Visit Number 7    Number of Visits 10    Date for PT Re-Evaluation 03/29/21    Authorization Type MCR    Progress Note Due on Visit 10    PT Start Time 0948    PT Stop Time 1033    PT Time Calculation (min) 45 min    Activity Tolerance Patient tolerated treatment well    Behavior During Therapy [facility] for tasks assessed/performed              Past Medical History:  Diagnosis Date   Arthritis    Coronary artery disease involving native coronary artery    cardiologist--- dr h. 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[place])    first dx 2013;  s/p  WLE and Vulvectomy's   PONV (postoperative nausea and vomiting)    Psoriasis    Scoliosis    cervicothoracic region   Past Surgical History:  Procedure Laterality Date   BLADDER SUSPENSION  2008   sling   BREAST EXCISIONAL BIOPSY Left    BUNIONECTOMY  2008   CARDIOVASCULAR STRESS TEST  09-30-2013   dr Marlou Porch   normal nuclear study/  no ischemia/  normal LV function and wall motion,  ef 61%   CATARACT EXTRACTION W/ INTRAOCULAR LENS  IMPLANT, BILATERAL  2016   COLONOSCOPY WITH ESOPHAGOGASTRODUODENOSCOPY (EGD)  04-30-2020  [facility] in [place]   HAND SURGERY Right 11/02/12   hand reconstruction at [place] EXCISIONAL BREAST BIOPSY Right 11/09/2018    Procedure: RADIOACTIVE SEED GUIDED EXCISIONAL RIGHT BREAST BIOPSY AND RIGHT AREOLA BIOPSY;  Surgeon: Rolm Bookbinder, MD;  Location: [place];  Service: General;  Laterality: Right;   TONSILLECTOMY  1973  age 47   [place]  age 79   VULVA [place] BIOPSY N/A 08/29/2014   Procedure: Georgena Spurling;  Surgeon: Everitt Amber, MD;  Location: [place];  Service: Gynecology;  Laterality: N/A;   VULVECTOMY N/A 08/29/2014   Procedure: WIDE LOCAL EXCISION OF VULVA;  Surgeon: Everitt Amber, MD;  Location: [place];  Service: Gynecology;  Laterality: N/A;   VULVECTOMY N/A 05/04/2015   Procedure: WIDE LOCAL EXCISION LEFT  VULVAR WITH BIOPSY OF RIGHT VULVA;  Surgeon: Everitt Amber, MD;  Location: [place];  Service: Gynecology;  Laterality: N/A;   VULVECTOMY Left 01/22/2016   Procedure: PARTIAL SIMPLE VULVECTOMY;  Surgeon: Everitt Amber, MD;  Location: [facility];  Service: Gynecology;  Laterality: Left;   VULVECTOMY N/A 02/14/2016   Procedure: WIDE EXCISION VULVECTOMY;  Surgeon: Everitt Amber, MD;  Location: [facility];  Service: Gynecology;  Laterality: N/A;   VULVECTOMY N/A 05/09/2020   Procedure: WIDE  LOCAL EXCISION VULVECTOMY;  Surgeon: Everitt Amber, MD;  Location: [facility];  Service: Gynecology;  Laterality: N/A;   Patient Active Problem List   Diagnosis Date Noted   Urge incontinence 06/11/2020   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[place]) 05/09/2020   Encounter for counseling 03/18/2019   Symptomatic mammary hypertrophy 03/18/2019   Neck pain 03/18/2019   Back pain 03/18/2019   Vulval cellulitis 48/18/5631   Lichen simplex chronicus 09/26/2014   Chest pain 10/21/2013   Dyspnea 10/21/2013   GERD (gastroesophageal reflux disease) 10/21/2013   Idiopathic scoliosis 10/21/2013   Personal history of colonic polyps 10/21/2013   Genital atrophy of female 07/02/2012    REFERRING DIAG:  REFERRING DIAG: M41.9 (ICD-10-CM) - Scoliosis, unspecified   THERAPY DIAG:  Abnormal posture  Other abnormalities of gait and mobility  Unsteadiness on feet  Chronic pain of right knee  Difficulty in walking, not elsewhere classified  Muscle weakness (generalized)  PERTINENT HISTORY:  hyperlipidemia, primary hypertension hypertension, headache, dyspnea, GERD, Paget's disease    SUBJECTIVE: mild soreness in Lt lower back after last session   OBJECTIVE:    PAIN:  Are you having pain? yes VAS scale: 0/10 Pain location: low back and Rt lateral hip Aggravating factors: walk/stop, standing for a while, very cautious with bending Relieving factors: lay flat, lay on my back with legs up on wall   PRECAUTIONS: None  PATIENT EDUCATION:  Education details: progression of scoliosis & use of exercises to support Person educated: Patient Education method: Consulting civil engineer, Demonstration, Tactile cues, Verbal cues, and Handouts Education comprehension: verbalized understanding, returned demonstration, verbal cues required, tactile cues required, and needs further education  DIAGNOSTIC FINDINGS:  MRI from 2017 1- mulitlevel degen changes with a mod retrolevoscoliosis of lumbar spine 2-L2-3 mild mod Rt lateral recess stenosis with possible encroachment of Rt L3 root 3- L3-4 mild to mod central canal stenosis and bil lateral recess stenosis with possible encroachment on L4 roots 4-L4-5 mod to severe Lt lateral recess stenosis with probably encroachment on Lt L5 root. Mod narrowing of Lt neural foramen with mass effect on the Lt L4 dorsal root ganglion. 5-L5-S1 abnormal contact bw the transverse process of L5 and Lt sacral ala related scoliosis. 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 No neural displacement or encrachment.    PATIENT SURVEYS:  LEFS 59   COGNITION:          Overall cognitive status: Within functional limits for tasks assessed                        SENSATION:           WFL   MUSCLE LENGTH: WFL   POSTURE:  Lumbar levoscoliosis, thoraco dextroscoliosis, Rt hip elevation   LUMBARAROM/PROM   General mobility is [facility] but has increased flexibility grossly   LE MMT: whole body compensation with MMT to RLE   A/PROM Right 02/18/2021 Left 02/18/2021 Right  11/11  Hip flexion 4  5 4   Hip extension       Hip abduction 4- 5 4+  Hip adduction       Hip internal rotation       Hip external rotation       Knee flexion       Knee extension       Ankle dorsiflexion       Ankle plantarflexion       Ankle inversion       Ankle eversion        (  Blank rows = not tested)   GAIT: [facility]      HOME EXERCISE PROGRAM: PRI 90-90 hip lift with Rt arm reach, Lt sidelying knee toward knee, Standing unsupported right lift with right trunk rotation [id number]   ASSESSMENT:   CLINICAL IMPRESSION: Focused on Rt QL stretching & inhibition using pilates and bad ragaz techniques. Pt tolerates without issue. She continues to have difficulty gaining then maintaining plank position in water. She is encouraged not to get frustrated.  Pt also encouraged to continue with on land HEP regardless of frustration for maximum benefit of therapy. No mention of surgical procedure.      GOALS: Goals reviewed with patient? Yes   SHORT TERM GOALS:   STG Name Target Date Goal status  1 Pt will perform PRI exercise daily for stability to curve Baseline: feel like I am awful at it- awkward 03/11/2021 Partially met  2 Pt will report reduced spasm in Lt lower back Baseline: started today after cooking 03/11/2021 ongoing                                                 LONG TERM GOALS:    LTG Name Target Date Goal status  1 Pt will be able to walk at least 4 miles without limitation of hip pain Baseline: begins around that mark 04/19/21 ongoing  2 Gross hip strength 5/5 Baseline: see outline 04/19/21 INITIAL  3 Will be able go shopping for at least 1 hour without limitation by hip  pain Baseline: stop and go walking around is the worst 04/19/21 INITIAL  4 LEFS to improve by [place] of 9 points Baseline: 59 at eval 04/19/21 INITIAL    PT FREQUENCY: 2x/week   PT DURATION: 6 weeks   PLANNED INTERVENTIONS: Therapeutic exercises, Therapeutic activity, Neuro Muscular re-education, Balance training, Gait training, Patient/Family education, Joint mobilization, Aquatic Therapy, Dry Needling, Moist heat, and Manual therapy      PAIN:  Are you having pain? No VAS scale: 0/10 Pain location: low back and Rt lateral hip Pain orientation:   PAIN TYPE:  Pain description:   Aggravating factors: walk/stop, standing for a while, very cautious with bending Relieving factors: lay flat, lay on my back with legs up on wall  TODAY'S TREATMENT:  11/21 Pt seen for aquatic therapy today.  Treatment took place in water 3.25-4.8 ft in depth at the [facility] pool. Temp of water was 91.  Pt entered/exited the pool via stairs step to pattern independently with bilat rail.  Pt entered water for aquatic therapy for first time and was introduced to principles and therapeutic effects of water as she ambulated and acclimated to pool.  Pt ambulates forward across pool , then backwards to engage posterior chain and then sidesteping x 4 widths ea.    Seated Stretching gastroc, hamstrings and add 3 x 20-25 sec hold  Standing Yoga stretch right QL 3 x30 sec hold  Pilates: Chest expansion 3x10 Lat pull 2 x 10 yellow and blue noodle Modified plank x 30 second hold x 3 Plank multiple tries to maintain position challenged hold x 12 seconds best -side to side pendulum. Holding position for lateral core stretch R/L. Then strengthening 2 x 7 reps.      -vertical to prone x4 hold x 10-15 seconds. Completes with increased succes using yellow hand buoys      -  flys 2x 5      Pt requires buoyancy for support and to offload joints with strengthening exercises. Viscosity of the water is needed for  resistance of    strengthening; water current perturbations provides challenge to standing balance unsupported, requiring increased core activation.    11/15 PRI: Lt sidelying knee toward knee, Standing unsupported right lift with right trunk rotation  Supine 90/90 with diaphragmatic breathing- anchor rib flare, breathing through upper rib cage with relaxed cervical region  Supine 90/90 with shoulder flexion  Quadruped Lt arm/Rt leg bird dog  11/11  Supine ab set with Rt arm reach  PRI Lt sidelying knee toward knee- adapted to not have wall  Side plank elbow and knee  SLS for Rt hip abd activation- transitioned into gait pattern  Planks  Manual- STM to thoraco lumabr paraspinals and QL       PLAN FOR NEXT SESSION: aquatics- pilates, planks; focusing on Lt rib flare reduction, Rt QL stretching & inhibition [facility] Kidney ([facility]) Kelton Bultman MPT   03/25/21 10:35 AM

## 2021-04-01 ENCOUNTER — Encounter (HOSPITAL_BASED_OUTPATIENT_CLINIC_OR_DEPARTMENT_OTHER): Payer: Self-pay | Admitting: Physical Therapy

## 2021-04-01 ENCOUNTER — Other Ambulatory Visit: Payer: Self-pay

## 2021-04-01 ENCOUNTER — Ambulatory Visit (HOSPITAL_BASED_OUTPATIENT_CLINIC_OR_DEPARTMENT_OTHER): Payer: Self-pay | Admitting: Physical Therapy

## 2021-04-01 ENCOUNTER — Ambulatory Visit (HOSPITAL_BASED_OUTPATIENT_CLINIC_OR_DEPARTMENT_OTHER): Payer: Medicare Other | Admitting: Physical Therapy

## 2021-04-01 DIAGNOSIS — G8929 Other chronic pain: Secondary | ICD-10-CM

## 2021-04-01 DIAGNOSIS — R293 Abnormal posture: Secondary | ICD-10-CM

## 2021-04-01 DIAGNOSIS — R262 Difficulty in walking, not elsewhere classified: Secondary | ICD-10-CM

## 2021-04-01 NOTE — Therapy (Signed)
OUTPATIENT PHYSICAL THERAPY TREATMENT NOTE   Patient Name: Stephanie Sanchez MRN: [id number] DOB:November 17, 1955, 65 y.o., female Today's Date: 04/01/2021  PCP: Crist Infante, MD REFERRING PROVIDER: Crist Infante, MD   PT End of Session - 04/01/21 1131     Visit Number 8    Number of Visits 10    Date for PT Re-Evaluation 03/29/21    Authorization Type MCR    Progress Note Due on Visit 10    PT Start Time 1116    PT Stop Time 1200    PT Time Calculation (min) 44 min    Equipment Utilized During Treatment Other (comment)    Activity Tolerance Patient tolerated treatment well    Behavior During Therapy [facility] for tasks assessed/performed              Past Medical History:  Diagnosis Date   Arthritis    Coronary artery disease involving native coronary artery    cardiologist--- dr h. 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[place])    first dx 2013;  s/p  WLE and Vulvectomy's   PONV (postoperative nausea and vomiting)    Psoriasis    Scoliosis    cervicothoracic region   Past Surgical History:  Procedure Laterality Date   BLADDER SUSPENSION  2008   sling   BREAST EXCISIONAL BIOPSY Left    BUNIONECTOMY  2008   CARDIOVASCULAR STRESS TEST  09-30-2013   dr Marlou Porch   normal nuclear study/  no ischemia/  normal LV function and wall motion,  ef 61%   CATARACT EXTRACTION W/ INTRAOCULAR LENS  IMPLANT, BILATERAL  2016   COLONOSCOPY WITH ESOPHAGOGASTRODUODENOSCOPY (EGD)  04-30-2020  [facility] [place]   HAND SURGERY Right 11/02/12   hand reconstruction at [place] EXCISIONAL BREAST BIOPSY Right 11/09/2018   Procedure: RADIOACTIVE SEED GUIDED EXCISIONAL RIGHT BREAST BIOPSY AND RIGHT AREOLA BIOPSY;  Surgeon: Rolm Bookbinder, MD;  Location: [place];  Service: General;  Laterality: Right;   TONSILLECTOMY  1973  age 77   [place]  age 36   VULVA [place] BIOPSY N/A 08/29/2014   Procedure: Georgena Spurling;  Surgeon: Everitt Amber, MD;  Location: [place];  Service: Gynecology;  Laterality: N/A;   VULVECTOMY N/A 08/29/2014   Procedure: WIDE LOCAL EXCISION OF VULVA;  Surgeon: Everitt Amber, MD;  Location: [place];  Service: Gynecology;  Laterality: N/A;   VULVECTOMY N/A 05/04/2015   Procedure: WIDE LOCAL EXCISION LEFT  VULVAR WITH BIOPSY OF RIGHT VULVA;  Surgeon: Everitt Amber, MD;  Location: [place];  Service: Gynecology;  Laterality: N/A;   VULVECTOMY Left 01/22/2016   Procedure: PARTIAL SIMPLE VULVECTOMY;  Surgeon: Everitt Amber, MD;  Location: [facility];  Service: Gynecology;  Laterality: Left;   VULVECTOMY N/A 02/14/2016   Procedure: WIDE EXCISION VULVECTOMY;  Surgeon: Everitt Amber, MD;  Location: [facility];  Service: Gynecology;  Laterality: N/A;  VULVECTOMY N/A 05/09/2020   Procedure: WIDE LOCAL EXCISION VULVECTOMY;  Surgeon: Everitt Amber, MD;  Location: [facility];  Service: Gynecology;  Laterality: N/A;   Patient Active Problem List   Diagnosis Date Noted   Urge incontinence 06/11/2020   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[place]) 05/09/2020   Encounter for counseling 03/18/2019   Symptomatic mammary hypertrophy 03/18/2019   Neck pain 03/18/2019   Back pain 03/18/2019   Vulval cellulitis 10/62/6948   Lichen simplex chronicus 09/26/2014   Chest pain 10/21/2013   Dyspnea 10/21/2013   GERD (gastroesophageal reflux disease) 10/21/2013   Idiopathic scoliosis 10/21/2013   Personal history of colonic polyps 10/21/2013   Genital  atrophy of female 07/02/2012    REFERRING DIAG: REFERRING DIAG: M41.9 (ICD-10-CM) - Scoliosis, unspecified   THERAPY DIAG:  Abnormal posture  Chronic pain of right knee  Difficulty in walking, not elsewhere classified  PERTINENT HISTORY:  hyperlipidemia, primary hypertension hypertension, headache, dyspnea, GERD, Paget's disease    SUBJECTIVE: Doing well. Feeling as though she is getting stronger   OBJECTIVE:    PAIN:  Are you having pain? yes VAS scale: 0/10 Pain location: low back and Rt lateral hip Aggravating factors: walk/stop, standing for a while, very cautious with bending Relieving factors: lay flat, lay on my back with legs up on wall   PRECAUTIONS: None  PATIENT EDUCATION:  Education details: progression of scoliosis & use of exercises to support Person educated: Patient Education method: Consulting civil engineer, Demonstration, Tactile cues, Verbal cues, and Handouts Education comprehension: verbalized understanding, returned demonstration, verbal cues required, tactile cues required, and needs further education  DIAGNOSTIC FINDINGS:  MRI from 2017 1- mulitlevel degen changes with a mod retrolevoscoliosis of lumbar spine 2-L2-3 mild mod Rt lateral recess stenosis with possible encroachment of Rt L3 root 3- L3-4 mild to mod central canal stenosis and bil lateral recess stenosis with possible encroachment on L4 roots 4-L4-5 mod to severe Lt lateral recess stenosis with probably encroachment on Lt L5 root. Mod narrowing of Lt neural foramen with mass effect on the Lt L4 dorsal root ganglion. 5-L5-S1 abnormal contact bw the transverse process of L5 and Lt sacral ala related scoliosis. 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 No neural displacement or encrachment.    PATIENT SURVEYS:  LEFS 59   COGNITION:          Overall cognitive status: Within functional limits for tasks assessed                        SENSATION:          WFL   MUSCLE LENGTH: WFL   POSTURE:   Lumbar levoscoliosis, thoraco dextroscoliosis, Rt hip elevation   LUMBARAROM/PROM   General mobility is [facility] but has increased flexibility grossly   LE MMT: whole body compensation with MMT to RLE   A/PROM Right 02/18/2021 Left 02/18/2021 Right  11/11  Hip flexion 4  5 4   Hip extension       Hip abduction 4- 5 4+  Hip adduction       Hip internal rotation       Hip external rotation       Knee flexion       Knee extension       Ankle dorsiflexion       Ankle plantarflexion       Ankle inversion       Ankle eversion        (Blank rows =  not tested)   GAIT: [employer]      HOME EXERCISE PROGRAM: PRI 90-90 hip lift with Rt arm reach, Lt sidelying knee toward knee, Standing unsupported right lift with right trunk rotation [id number]   ASSESSMENT:   CLINICAL IMPRESSION: Pt concerned about losing strength with planned procedure in January. Encouraged focus on strengthening as much as able prior and returning to therapy as soon as able. Pt demonstrating improved core strength with pilates. Pt able to execute plank and reverse plank positions with improvement today. Able to move from blue noodle to yellow noodle with lat pull downs. Some low back spasm with initial proximal strengthening but alleviated with repositioning and cues for core tightness. Continues with QL stretching and strengthening     GOALS: Goals reviewed with patient? Yes   SHORT TERM GOALS:   STG Name Target Date Goal status  1 Pt will perform PRI exercise daily for stability to curve Baseline: feel like I am awful at it- awkward 03/11/2021 Partially met  2 Pt will report reduced spasm in Lt lower back Baseline: started today after cooking 03/11/2021 ongoing                                                 LONG TERM GOALS:    LTG Name Target Date Goal status  1 Pt will be able to walk at least 4 miles without limitation of hip pain Baseline: begins around that mark 04/19/21 ongoing  2 Gross hip strength  5/5 Baseline: see outline 04/19/21 INITIAL  3 Will be able go shopping for at least 1 hour without limitation by hip pain Baseline: stop and go walking around is the worst 04/19/21 INITIAL  4 LEFS to improve by [place] of 9 points Baseline: 59 at eval 04/19/21 INITIAL    PT FREQUENCY: 2x/week   PT DURATION: 6 weeks   PLANNED INTERVENTIONS: Therapeutic exercises, Therapeutic activity, Neuro Muscular re-education, Balance training, Gait training, Patient/Family education, Joint mobilization, Aquatic Therapy, Dry Needling, Moist heat, and Manual therapy      PAIN:  Are you having pain? No VAS scale: 0/10 Pain location: low back and Rt lateral hip Pain orientation:   PAIN TYPE:  Pain description:   Aggravating factors: walk/stop, standing for a while, very cautious with bending Relieving factors: lay flat, lay on my back with legs up on wall  TODAY'S TREATMENT:  11/28 Pt seen for aquatic therapy today.  Treatment took place in water 3.25-4.8 ft in depth at the [facility] pool. Temp of water was 91.  Pt entered/exited the pool via stairs step to pattern independently with bilat rail.  Pt entered water for aquatic therapy for first time and was introduced to principles and therapeutic effects of water as she ambulated and acclimated to pool.  Pt ambulates forward across pool , then backwards to engage posterior chain and then sidesteping x 4 widths ea.    Seated Stretching gastroc, hamstrings and add 3 x 20-25 sec hold  Standing Yoga stretch right QL 3 x30 sec hold Long leg Hip flex;ext and add/abd x10 holding to hand buoys. Cues for core tightness and control. (Proximal strengthening)  Pilates: Chest expansion 3x10 Lat pull 2x10 blue noodle, x5 yellow Plank multiple tries to gain position. Although without good tight core control, pt able to maintain position x 20 seconds using squoodle Side to side  pendulum. Holding position for lateral core stretch R/L. Then  strengthening 2 x 8 reps. (QL stretch and strengthening)      Reverse plank with squoodle            Pt requires buoyancy for support and to offload joints with strengthening exercises. Viscosity of the water is needed for resistance of    strengthening; water current perturbations provides challenge to standing balance unsupported, requiring increased core activation.   11/21 Pt seen for aquatic therapy today.  Treatment took place in water 3.25-4.8 ft in depth at the [facility] pool. Temp of water was 91.  Pt entered/exited the pool via stairs step to pattern independently with bilat rail.  Pt entered water for aquatic therapy for first time and was introduced to principles and therapeutic effects of water as she ambulated and acclimated to pool.  Pt ambulates forward across pool , then backwards to engage posterior chain and then sidesteping x 4 widths ea.    Seated Stretching gastroc, hamstrings and add 3 x 20-25 sec hold  Standing Yoga stretch right QL 3 x30 sec hold  Pilates: Chest expansion 3x10 Lat pull 2 x 10 yellow and blue noodle Modified plank x 30 second hold x 3 Plank multiple tries to maintain position challenged hold x 12 seconds best -side to side pendulum. Holding position for lateral core stretch R/L. Then strengthening 2 x 7 reps.      -vertical to prone x4 hold x 10-15 seconds. Completes with increased succes using yellow hand buoys      -flys 2x 5      Pt requires buoyancy for support and to offload joints with strengthening exercises. Viscosity of the water is needed for resistance of    strengthening; water current perturbations provides challenge to standing balance unsupported, requiring increased core activation.    11/15 PRI: Lt sidelying knee toward knee, Standing unsupported right lift with right trunk rotation  Supine 90/90 with diaphragmatic breathing- anchor rib flare, breathing through upper rib cage with relaxed cervical region  Supine 90/90  with shoulder flexion  Quadruped Lt arm/Rt leg bird dog  11/11  Supine ab set with Rt arm reach  PRI Lt sidelying knee toward knee- adapted to not have wall  Side plank elbow and knee  SLS for Rt hip abd activation- transitioned into gait pattern  Planks  Manual- STM to thoraco lumabr paraspinals and QL       PLAN FOR NEXT SESSION: aquatics- pilates, planks; focusing on Lt rib flare reduction, Rt QL stretching & inhibition Stanton Kidney ([place]) Kail Fraley MPT   04/01/21 1:12 PM

## 2021-04-03 ENCOUNTER — Encounter (HOSPITAL_BASED_OUTPATIENT_CLINIC_OR_DEPARTMENT_OTHER): Payer: Self-pay | Admitting: Physical Therapy

## 2021-04-03 ENCOUNTER — Ambulatory Visit (HOSPITAL_BASED_OUTPATIENT_CLINIC_OR_DEPARTMENT_OTHER): Payer: Medicare Other | Admitting: Physical Therapy

## 2021-04-03 ENCOUNTER — Other Ambulatory Visit: Payer: Self-pay

## 2021-04-03 DIAGNOSIS — R262 Difficulty in walking, not elsewhere classified: Secondary | ICD-10-CM

## 2021-04-03 DIAGNOSIS — R293 Abnormal posture: Secondary | ICD-10-CM

## 2021-04-03 DIAGNOSIS — G8929 Other chronic pain: Secondary | ICD-10-CM

## 2021-04-03 NOTE — Therapy (Signed)
OUTPATIENT PHYSICAL THERAPY TREATMENT NOTE   Patient Name: Stephanie Sanchez MRN: [id number] DOB:06-08-1955, 65 y.o., female Today's Date: 04/03/2021  PCP: Crist Infante, MD REFERRING PROVIDER: Crist Infante, MD   PT End of Session - 04/03/21 0912     Visit Number 9    Number of Visits 10    Date for PT Re-Evaluation 03/29/21    Authorization Type MCR    Progress Note Due on Visit 10    PT Start Time 0907    PT Stop Time 0947    PT Time Calculation (min) 40 min    Equipment Utilized During Treatment Other (comment)    Activity Tolerance Patient tolerated treatment well    Behavior During Therapy [facility] for tasks assessed/performed              Past Medical History:  Diagnosis Date   Arthritis    Coronary artery disease involving native coronary artery    cardiologist--- dr h. 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[place])    first dx 2013;  s/p  WLE and Vulvectomy's   PONV (postoperative nausea and vomiting)    Psoriasis    Scoliosis    cervicothoracic region   Past Surgical History:  Procedure Laterality Date   BLADDER SUSPENSION  2008   sling   BREAST EXCISIONAL BIOPSY Left    BUNIONECTOMY  2008   CARDIOVASCULAR STRESS TEST  09-30-2013   dr Marlou Porch   normal nuclear study/  no ischemia/  normal LV function and wall motion,  ef 61%   CATARACT EXTRACTION W/ INTRAOCULAR LENS  IMPLANT, BILATERAL  2016   COLONOSCOPY WITH ESOPHAGOGASTRODUODENOSCOPY (EGD)  04-30-2020  [facility] [place]   HAND SURGERY Right 11/02/12   hand reconstruction at [place] EXCISIONAL BREAST BIOPSY Right 11/09/2018   Procedure: RADIOACTIVE SEED GUIDED EXCISIONAL RIGHT BREAST BIOPSY AND RIGHT AREOLA BIOPSY;  Surgeon: Rolm Bookbinder, MD;  Location: [place];  Service: General;  Laterality: Right;   TONSILLECTOMY  1973  age 62   [place]  age 64   VULVA [place] BIOPSY N/A 08/29/2014   Procedure: Georgena Spurling;  Surgeon: Everitt Amber, MD;  Location: [place];  Service: Gynecology;  Laterality: N/A;   VULVECTOMY N/A 08/29/2014   Procedure: WIDE LOCAL EXCISION OF VULVA;  Surgeon: Everitt Amber, MD;  Location: [place];  Service: Gynecology;  Laterality: N/A;   VULVECTOMY N/A 05/04/2015   Procedure: WIDE LOCAL EXCISION LEFT  VULVAR WITH BIOPSY OF RIGHT VULVA;  Surgeon: Everitt Amber, MD;  Location: [place];  Service: Gynecology;  Laterality: N/A;   VULVECTOMY Left 01/22/2016   Procedure: PARTIAL SIMPLE VULVECTOMY;  Surgeon: Everitt Amber, MD;  Location: [facility];  Service: Gynecology;  Laterality: Left;   VULVECTOMY N/A 02/14/2016   Procedure: WIDE EXCISION VULVECTOMY;  Surgeon: Everitt Amber, MD;  Location: [facility];  Service: Gynecology;  Laterality: N/A;  VULVECTOMY N/A 05/09/2020   Procedure: WIDE LOCAL EXCISION VULVECTOMY;  Surgeon: Everitt Amber, MD;  Location: [facility];  Service: Gynecology;  Laterality: N/A;   Patient Active Problem List   Diagnosis Date Noted   Urge incontinence 06/11/2020   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[place]) 05/09/2020   Encounter for counseling 03/18/2019   Symptomatic mammary hypertrophy 03/18/2019   Neck pain 03/18/2019   Back pain 03/18/2019   Vulval cellulitis 62/56/3893   Lichen simplex chronicus 09/26/2014   Chest pain 10/21/2013   Dyspnea 10/21/2013   GERD (gastroesophageal reflux disease) 10/21/2013   Idiopathic scoliosis 10/21/2013   Personal history of colonic polyps 10/21/2013   Genital  atrophy of female 07/02/2012    REFERRING DIAG: REFERRING DIAG: M41.9 (ICD-10-CM) - Scoliosis, unspecified   THERAPY DIAG:  Abnormal posture  Chronic pain of right knee  Difficulty in walking, not elsewhere classified  PERTINENT HISTORY:  hyperlipidemia, primary hypertension hypertension, headache, dyspnea, GERD, Paget's disease    SUBJECTIVE: Doing well. Feeling as though she is getting stronger   OBJECTIVE:    PAIN:  Are you having pain? yes VAS scale: 0/10 Pain location: low back and Rt lateral hip Aggravating factors: walk/stop, standing for a while, very cautious with bending Relieving factors: lay flat, lay on my back with legs up on wall   PRECAUTIONS: None  PATIENT EDUCATION:  Education details: progression of scoliosis & use of exercises to support Person educated: Patient Education method: Consulting civil engineer, Demonstration, Tactile cues, Verbal cues, and Handouts Education comprehension: verbalized understanding, returned demonstration, verbal cues required, tactile cues required, and needs further education  DIAGNOSTIC FINDINGS:  MRI from 2017 1- mulitlevel degen changes with a mod retrolevoscoliosis of lumbar spine 2-L2-3 mild mod Rt lateral recess stenosis with possible encroachment of Rt L3 root 3- L3-4 mild to mod central canal stenosis and bil lateral recess stenosis with possible encroachment on L4 roots 4-L4-5 mod to severe Lt lateral recess stenosis with probably encroachment on Lt L5 root. Mod narrowing of Lt neural foramen with mass effect on the Lt L4 dorsal root ganglion. 5-L5-S1 abnormal contact bw the transverse process of L5 and Lt sacral ala related scoliosis. 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 No neural displacement or encrachment.    PATIENT SURVEYS:  LEFS 59   COGNITION:          Overall cognitive status: Within functional limits for tasks assessed                        SENSATION:          WFL   MUSCLE LENGTH: WFL   POSTURE:   Lumbar levoscoliosis, thoraco dextroscoliosis, Rt hip elevation   LUMBARAROM/PROM   General mobility is [facility] but has increased flexibility grossly   LE MMT: whole body compensation with MMT to RLE   A/PROM Right 02/18/2021 Left 02/18/2021 Right  11/11  Hip flexion 4  5 4   Hip extension       Hip abduction 4- 5 4+  Hip adduction       Hip internal rotation       Hip external rotation       Knee flexion       Knee extension       Ankle dorsiflexion       Ankle plantarflexion       Ankle inversion       Ankle eversion        (Blank rows =  not tested)   GAIT: [facility]      HOME EXERCISE PROGRAM: PRI 90-90 hip lift with Rt arm reach, Lt sidelying knee toward knee, Standing unsupported right lift with right trunk rotation [id number]   ASSESSMENT:   CLINICAL IMPRESSION: Pt with improving core strength as evidenced by increased rep of lat pulls along with use of heavy buoy with completion.  She demonstrates longer holding times with plank and reverse plank.  Overall pain in right hip decreased from 4/10 after 4 mile walk to 2/10. She reports improved compliance with HEP for stability to curve.  Progressing very well.     GOALS: Goals reviewed with patient? Yes   SHORT TERM GOALS:   STG Name Target Date Goal status  1 Pt will perform PRI exercise daily for stability to curve Baseline: feel like I am awful at it- awkward 03/11/2021 Partially met  2 Pt will report reduced spasm in Lt lower back Baseline: started today after cooking 03/11/2021 ongoing                                                 LONG TERM GOALS:    LTG Name Target Date Goal status  1 Pt will be able to walk at least 4 miles without limitation of hip pain Baseline: begins around that mark 04/19/21 ongoing  2 Gross hip strength 5/5 Baseline: see outline 04/19/21 INITIAL  3 Will be able go shopping for at least 1 hour without limitation by hip pain Baseline: stop and go walking around is the worst  04/19/21 INITIAL  4 LEFS to improve by [place] of 9 points Baseline: 59 at eval 04/19/21 INITIAL    PT FREQUENCY: 2x/week   PT DURATION: 6 weeks   PLANNED INTERVENTIONS: Therapeutic exercises, Therapeutic activity, Neuro Muscular re-education, Balance training, Gait training, Patient/Family education, Joint mobilization, Aquatic Therapy, Dry Needling, Moist heat, and Manual therapy      PAIN:  Are you having pain? No VAS scale: 0/10 Pain location: low back and Rt lateral hip Pain orientation:   PAIN TYPE:  Pain description:   Aggravating factors: walk/stop, standing for a while, very cautious with bending Relieving factors: lay flat, lay on my back with legs up on wall  TODAY'S TREATMENT:  11/30 Pt seen for aquatic therapy today.  Treatment took place in water 3.25-4.8 ft in depth at the [facility] pool. Temp of water was 91.  Pt entered/exited the pool via stairs step to pattern independently with bilat rail.  Standing Yoga stretch right QL 3 x30 sec hold  Pilates: Chest expansion (modified plank 3 x 30 seconds warm up Lat pull x10 then x7 with yellow noodle    Plank multiple tries to gain position. Multiple tries best hold x 30 seconds using squoodle Completed 5-6 tries holding all at least 10 seconds Side to side pendulum. Holding position for lateral core stretch R/L. Then strengthening x10 then x8 reps. (QL stretch and strengthening) Sitting balance on squoodle gaining position then again with hands out of water.  Initiation of reverse plank pushing squoodle below hips in seate dpsotion holding x 30 seconds      Reverse plank with squoodle x3 with best hold x 15 seconds            Pt requires buoyancy for support and to offload joints with strengthening exercises. Viscosity of  the water is needed for resistance of    strengthening; water current perturbations provides challenge to standing balance unsupported, requiring increased core activation.   11/21 Pt seen  for aquatic therapy today.  Treatment took place in water 3.25-4.8 ft in depth at the [facility] pool. Temp of water was 91.  Pt entered/exited the pool via stairs step to pattern independently with bilat rail.  Pt entered water for aquatic therapy for first time and was introduced to principles and therapeutic effects of water as she ambulated and acclimated to pool.  Pt ambulates forward across pool , then backwards to engage posterior chain and then sidesteping x 4 widths ea.    Seated Stretching gastroc, hamstrings and add 3 x 20-25 sec hold  Standing Yoga stretch right QL 3 x30 sec hold  Pilates: Chest expansion 3x10 Lat pull 2 x 10 yellow and blue noodle Modified plank x 30 second hold x 3 Plank multiple tries to maintain position challenged hold x 12 seconds best -side to side pendulum. Holding position for lateral core stretch R/L. Then strengthening 2 x 7 reps.      -vertical to prone x4 hold x 10-15 seconds. Completes with increased succes using yellow hand buoys      -flys 2x 5      Pt requires buoyancy for support and to offload joints with strengthening exercises. Viscosity of the water is needed for resistance of    strengthening; water current perturbations provides challenge to standing balance unsupported, requiring increased core activation.          PLAN FOR NEXT SESSION: aquatics- pilates, planks; focusing on Lt rib flare reduction, Rt QL stretching & inhibition Annamarie Major) Kimyatta Lecy MPT   04/03/21 9:49 AM

## 2021-04-04 ENCOUNTER — Ambulatory Visit (HOSPITAL_BASED_OUTPATIENT_CLINIC_OR_DEPARTMENT_OTHER): Payer: Medicare Other | Admitting: Physical Therapy

## 2021-04-08 ENCOUNTER — Other Ambulatory Visit: Payer: Self-pay

## 2021-04-08 ENCOUNTER — Ambulatory Visit (HOSPITAL_BASED_OUTPATIENT_CLINIC_OR_DEPARTMENT_OTHER): Payer: Medicare Other | Attending: Orthopedic Surgery | Admitting: Physical Therapy

## 2021-04-08 ENCOUNTER — Encounter (HOSPITAL_BASED_OUTPATIENT_CLINIC_OR_DEPARTMENT_OTHER): Payer: Self-pay | Admitting: Physical Therapy

## 2021-04-08 DIAGNOSIS — M25551 Pain in right hip: Secondary | ICD-10-CM | POA: Diagnosis present

## 2021-04-08 DIAGNOSIS — M6281 Muscle weakness (generalized): Secondary | ICD-10-CM | POA: Insufficient documentation

## 2021-04-08 DIAGNOSIS — M25561 Pain in right knee: Secondary | ICD-10-CM | POA: Insufficient documentation

## 2021-04-08 DIAGNOSIS — R262 Difficulty in walking, not elsewhere classified: Secondary | ICD-10-CM | POA: Diagnosis present

## 2021-04-08 DIAGNOSIS — R293 Abnormal posture: Secondary | ICD-10-CM | POA: Insufficient documentation

## 2021-04-08 DIAGNOSIS — G8929 Other chronic pain: Secondary | ICD-10-CM | POA: Insufficient documentation

## 2021-04-08 NOTE — Therapy (Addendum)
OUTPATIENT PHYSICAL THERAPY TREATMENT NOTE  Progress Note Reporting Period 02/18/21 to 04/08/21  See note below for Objective Data and Assessment of Progress/Goals.      Patient Name: Stephanie Sanchez MRN: [id number] DOB:Oct 19, 1955, 65 y.o., female Today's Date: 04/08/2021  PCP: Crist Infante, MD REFERRING PROVIDER: Crist Infante, MD   PT End of Session - 04/08/21 0936     Visit Number 10    Number of Visits 10    Date for PT Re-Evaluation 04/19/21    Authorization Type MCR    Progress Note Due on Visit 10    PT Start Time 0905    PT Stop Time 0950    PT Time Calculation (min) 45 min    Equipment Utilized During Treatment Other (comment)    Activity Tolerance Patient tolerated treatment well    Behavior During Therapy [facility] for tasks assessed/performed              Past Medical History:  Diagnosis Date   Arthritis    Coronary artery disease involving native coronary artery    cardiologist--- dr h. 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[place])    first dx 2013;  s/p  WLE and Vulvectomy's   PONV (postoperative nausea and vomiting)    Psoriasis    Scoliosis    cervicothoracic region   Past Surgical History:  Procedure Laterality Date   BLADDER SUSPENSION  2008   sling   BREAST EXCISIONAL BIOPSY Left    BUNIONECTOMY  2008   CARDIOVASCULAR STRESS TEST  09-30-2013   dr Marlou Porch   normal nuclear study/  no ischemia/  normal LV function and wall motion,  ef 61%   CATARACT EXTRACTION W/ INTRAOCULAR LENS  IMPLANT, BILATERAL  2016   COLONOSCOPY WITH  ESOPHAGOGASTRODUODENOSCOPY (EGD)  04-30-2020  [facility] [place]   HAND SURGERY Right 11/02/12   hand reconstruction at [place] EXCISIONAL BREAST BIOPSY Right 11/09/2018   Procedure: RADIOACTIVE SEED GUIDED EXCISIONAL RIGHT BREAST BIOPSY AND RIGHT AREOLA BIOPSY;  Surgeon: Rolm Bookbinder, MD;  Location: [place];  Service: General;  Laterality: Right;   TONSILLECTOMY  1973  age 45   [place]  age 43   VULVA [place] BIOPSY N/A 08/29/2014   Procedure: Georgena Spurling;  Surgeon: Everitt Amber, MD;  Location: [place];  Service: Gynecology;  Laterality: N/A;   VULVECTOMY N/A 08/29/2014   Procedure: WIDE LOCAL EXCISION OF VULVA;  Surgeon: Everitt Amber, MD;  Location: [place];  Service: Gynecology;  Laterality: N/A;   VULVECTOMY N/A 05/04/2015   Procedure: WIDE LOCAL EXCISION LEFT  VULVAR WITH BIOPSY OF RIGHT VULVA;  Surgeon: Everitt Amber, MD;  Location: [place];  Service: Gynecology;  Laterality: N/A;   VULVECTOMY Left 01/22/2016   Procedure: PARTIAL SIMPLE VULVECTOMY;  Surgeon: Everitt Amber, MD;  Location: [facility];  Service: Gynecology;  Laterality: Left;   VULVECTOMY N/A 02/14/2016  Procedure: WIDE EXCISION VULVECTOMY;  Surgeon: Everitt Amber, MD;  Location: [facility];  Service: Gynecology;  Laterality: N/A;   VULVECTOMY N/A 05/09/2020   Procedure: WIDE LOCAL EXCISION VULVECTOMY;  Surgeon: Everitt Amber, MD;  Location: [facility];  Service: Gynecology;  Laterality: N/A;   Patient Active Problem List   Diagnosis Date Noted   Urge incontinence 06/11/2020   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[place]) 05/09/2020   Encounter for counseling 03/18/2019   Symptomatic mammary hypertrophy 03/18/2019   Neck pain 03/18/2019   Back pain 03/18/2019   Vulval cellulitis 92/03/9416   Lichen simplex chronicus 09/26/2014   Chest pain 10/21/2013   Dyspnea 10/21/2013   GERD  (gastroesophageal reflux disease) 10/21/2013   Idiopathic scoliosis 10/21/2013   Personal history of colonic polyps 10/21/2013   Genital atrophy of female 07/02/2012    REFERRING DIAG: REFERRING DIAG: M41.9 (ICD-10-CM) - Scoliosis, unspecified   THERAPY DIAG:  Abnormal posture  Chronic pain of right knee  Difficulty in walking, not elsewhere classified  PERTINENT HISTORY:  hyperlipidemia, primary hypertension hypertension, headache, dyspnea, GERD, Paget's disease    SUBJECTIVE: No complaints or concerns, no falls or pain   OBJECTIVE:    PAIN:  Are you having pain? yes VAS scale: 0/10 Pain location: low back and Rt lateral hip Aggravating factors: walk/stop, standing for a while, very cautious with bending Relieving factors: lay flat, lay on my back with legs up on wall   PRECAUTIONS: None  PATIENT EDUCATION:  Education details: progression of scoliosis & use of exercises to support Person educated: Patient Education method: Consulting civil engineer, Demonstration, Tactile cues, Verbal cues, and Handouts Education comprehension: verbalized understanding, returned demonstration, verbal cues required, tactile cues required, and needs further education  DIAGNOSTIC FINDINGS:  MRI from 2017 1- mulitlevel degen changes with a mod retrolevoscoliosis of lumbar spine 2-L2-3 mild mod Rt lateral recess stenosis with possible encroachment of Rt L3 root 3- L3-4 mild to mod central canal stenosis and bil lateral recess stenosis with possible encroachment on L4 roots 4-L4-5 mod to severe Lt lateral recess stenosis with probably encroachment on Lt L5 root. Mod narrowing of Lt neural foramen with mass effect on the Lt L4 dorsal root ganglion. 5-L5-S1 abnormal contact bw the transverse process of L5 and Lt sacral ala related scoliosis. 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 No neural displacement or encrachment.    PATIENT SURVEYS:  LEFS 59   COGNITION:          Overall cognitive status: Within  functional limits for tasks assessed                        SENSATION:          WFL   MUSCLE LENGTH: WFL   POSTURE:  Lumbar levoscoliosis, thoraco dextroscoliosis, Rt hip elevation   LUMBARAROM/PROM   General mobility is [facility] but has increased flexibility grossly   LE MMT: whole body compensation with MMT to RLE   A/PROM Right 02/18/2021 Left 02/18/2021 Right  11/11  Hip flexion _0 Hip extension       Hip abduction 4- 5 4+  Hip adduction       Hip internal rotation       Hip external rotation       Knee flexion       Knee extension       Ankle dorsiflexion       Ankle plantarflexion  Ankle inversion       Ankle eversion        (Blank rows = not tested)   GAIT: [facility]      HOME EXERCISE PROGRAM: PRI 90-90 hip lift with Rt arm reach, Lt sidelying knee toward knee, Standing unsupported right lift with right trunk rotation [id number].  Added aquatics in [place]:   CLINICAL IMPRESSION: Pt complaining of UE fatigue with planks.  Modified some positions and readjusted pt's technique to improve focus on core rather than UE.  Pt arrives early for session and warms up indep. Added a side plank to reduce left rib flare reduction. Pt has difficulty gaining position maybe secondary to fatigue.  Will start with next visit. She did complete side pendulums with similar target well. Laminated HEP will instruct next visit. Pt progressing towards goal slowly.  She does report compliance with PRI although continues to feel like she is not completing them well.  She vu of importance of completing. She is gaining strength as evidenced by increased reps of all exercises completed in aquatic.  She continues to have some Lt lower back spasms with standing usually while cooking. She ambulates outdoor but remains on level ground for improved toleration up to 4 miles.  She is encouraged to continue completing. She is planning on having a surgery in Jan which will but her in  supine for up to 1 month.  She is encouraged to gain as much strength as able prior/ increase HEP at pool for improved ability after.     GOALS: Goals reviewed with patient? Yes   SHORT TERM GOALS:   STG Name Target Date Goal status  1 Pt will perform PRI exercise daily for stability to curve Baseline: feel like I am awful at it- awkward 03/11/2021 Goal met 12/5  2 Pt will report reduced spasm in Lt lower back Baseline: started today after cooking 03/11/2021 ongoing                                                 LONG TERM GOALS:    LTG Name Target Date Goal status  1 Pt will be able to walk at least 4 miles without limitation of hip pain Baseline: begins around that mark 04/19/21 ongoing  2 Gross hip strength 5/5 Baseline: see outline 04/19/21 INITIAL Ongoing12/5  3 Will be able go shopping for at least 1 hour without limitation by hip pain Baseline: stop and go walking around is the worst 04/19/21 INITIAL Ongoing12/5  4 LEFS to improve by [place] of 9 points Baseline: 59 at eval 04/19/21 INITIAL Ongoing12-9    PT FREQUENCY: 2x/week   PT DURATION: 6 weeks   PLANNED INTERVENTIONS: Therapeutic exercises, Therapeutic activity, Neuro Muscular re-education, Balance training, Gait training, Patient/Family education, Joint mobilization, Aquatic Therapy, Dry Needling, Moist heat, and Manual therapy      PAIN:  Are you having pain? No VAS scale: 0/10 Pain location: low back and Rt lateral hip Pain orientation:   PAIN TYPE:  Pain description:   Aggravating factors: walk/stop, standing for a while, very cautious with bending Relieving factors: lay flat, lay on my back with legs up on wall  TODAY'S TREATMENT:  12/5 Pt seen for aquatic therapy today.  Treatment took place in water 3.25-4.8 ft in depth at the [facility] pool. Temp of water was  91.  Pt entered/exited the pool via stairs step to pattern independently with bilat rail.  Standing Yoga stretch right QL 3  x30 sec hold  Pilates: Chest expansion (modified plank 3 x 30 seconds warm up) Lat pull 2x12 yellow noodle    Plank x 5 minutes holding best time 25 seconds Side to side pendulum. Holding position for lateral core stretch R/L. Then strengthening 2x12  (QL stretch and strengthening) Sitting balance on yellow noodle gaining position then again with hands out of water after several tries  Initiation of reverse plank pushing squoodle below hips in seated      Long leg hipf flex; extension; add/abd 2 x 12            Pt requires buoyancy for support and to offload joints with strengthening exercises. Viscosity of the water is needed for resistance of    strengthening; water current perturbations provides challenge to standing balance unsupported, requiring increased core activation.   11/30 Pt seen for aquatic therapy today.  Treatment took place in water 3.25-4.8 ft in depth at the [facility] pool. Temp of water was 91.  Pt entered/exited the pool via stairs step to pattern independently with bilat rail.  Standing Yoga stretch right QL 3 x30 sec hold  Pilates: Chest expansion (modified plank 3 x 30 seconds warm up Lat pull x10 then x7 with yellow noodle    Plank multiple tries to gain position. Multiple tries best hold x 30 seconds using squoodle Completed 5-6 tries holding all at least 10 seconds Side to side pendulum. Holding position for lateral core stretch R/L. Then strengthening x10 then x8 reps. (QL stretch and strengthening) Sitting balance on squoodle gaining position then again with hands out of water.  Initiation of reverse plank pushing squoodle below hips in seate dpsotion holding x 30 seconds      Reverse plank with squoodle x3 with best hold x 15 seconds            Pt requires buoyancy for support and to offload joints with strengthening exercises. Viscosity of the water is needed for resistance of    strengthening; water current perturbations provides challenge to  standing balance unsupported, requiring increased core activation.     PLAN FOR NEXT SESSION: laminated HEP /LTG's [facility] Tharon Aquas) Shain Pauwels MPT   04/08/21 2:27 PM Addended Stanton Kidney Tharon Aquas) Ruthel Martine MPT

## 2021-04-10 ENCOUNTER — Ambulatory Visit (HOSPITAL_BASED_OUTPATIENT_CLINIC_OR_DEPARTMENT_OTHER): Payer: Medicare Other | Admitting: Physical Therapy

## 2021-04-10 ENCOUNTER — Encounter (HOSPITAL_BASED_OUTPATIENT_CLINIC_OR_DEPARTMENT_OTHER): Payer: Self-pay | Admitting: Physical Therapy

## 2021-04-10 ENCOUNTER — Other Ambulatory Visit: Payer: Self-pay

## 2021-04-10 DIAGNOSIS — M25561 Pain in right knee: Secondary | ICD-10-CM

## 2021-04-10 DIAGNOSIS — G8929 Other chronic pain: Secondary | ICD-10-CM

## 2021-04-10 DIAGNOSIS — R293 Abnormal posture: Secondary | ICD-10-CM

## 2021-04-10 DIAGNOSIS — R262 Difficulty in walking, not elsewhere classified: Secondary | ICD-10-CM

## 2021-04-10 NOTE — Therapy (Signed)
OUTPATIENT PHYSICAL THERAPY TREATMENT NOTE  Progress Note Reporting Period 02/18/21 to 04/08/21  See note below for Objective Data and Assessment of Progress/Goals.      Patient Name: Stephanie Sanchez MRN: [id number] DOB:Aug 19, 1955, 65 y.o., female Today's Date: 04/10/2021  PCP: Crist Infante, MD REFERRING PROVIDER: Crist Infante, MD   PT End of Session - 04/10/21 0907     Visit Number 11    Number of Visits 13    Date for PT Re-Evaluation 04/19/21    Authorization Type MCR    Progress Note Due on Visit 10    PT Start Time 0901    PT Stop Time 0950    PT Time Calculation (min) 49 min    Equipment Utilized During Treatment Other (comment)    Activity Tolerance Patient tolerated treatment well    Behavior During Therapy [facility] for tasks assessed/performed              Past Medical History:  Diagnosis Date   Arthritis    Coronary artery disease involving native coronary artery    cardiologist--- dr h. 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[place])    first dx 2013;  s/p  WLE and Vulvectomy's   PONV (postoperative nausea and vomiting)    Psoriasis    Scoliosis    cervicothoracic region   Past Surgical History:  Procedure Laterality Date   BLADDER SUSPENSION  2008   sling   BREAST EXCISIONAL BIOPSY Left    BUNIONECTOMY  2008   CARDIOVASCULAR STRESS TEST  09-30-2013   dr Marlou Porch   normal nuclear study/  no ischemia/  normal LV function and wall motion,  ef 61%   CATARACT EXTRACTION W/ INTRAOCULAR LENS  IMPLANT, BILATERAL  2016   COLONOSCOPY WITH  ESOPHAGOGASTRODUODENOSCOPY (EGD)  04-30-2020  [facility] [place]   HAND SURGERY Right 11/02/12   hand reconstruction at [place] EXCISIONAL BREAST BIOPSY Right 11/09/2018   Procedure: RADIOACTIVE SEED GUIDED EXCISIONAL RIGHT BREAST BIOPSY AND RIGHT AREOLA BIOPSY;  Surgeon: Rolm Bookbinder, MD;  Location: [place];  Service: General;  Laterality: Right;   TONSILLECTOMY  1973  age 52   [place]  age 46   VULVA [place] BIOPSY N/A 08/29/2014   Procedure: Georgena Spurling;  Surgeon: Everitt Amber, MD;  Location: [place];  Service: Gynecology;  Laterality: N/A;   VULVECTOMY N/A 08/29/2014   Procedure: WIDE LOCAL EXCISION OF VULVA;  Surgeon: Everitt Amber, MD;  Location: [place];  Service: Gynecology;  Laterality: N/A;   VULVECTOMY N/A 05/04/2015   Procedure: WIDE LOCAL EXCISION LEFT  VULVAR WITH BIOPSY OF RIGHT VULVA;  Surgeon: Everitt Amber, MD;  Location: [place];  Service: Gynecology;  Laterality: N/A;   VULVECTOMY Left 01/22/2016   Procedure: PARTIAL SIMPLE VULVECTOMY;  Surgeon: Everitt Amber, MD;  Location: [facility];  Service: Gynecology;  Laterality: Left;   VULVECTOMY N/A 02/14/2016  Procedure: WIDE EXCISION VULVECTOMY;  Surgeon: Everitt Amber, MD;  Location: [facility];  Service: Gynecology;  Laterality: N/A;   VULVECTOMY N/A 05/09/2020   Procedure: WIDE LOCAL EXCISION VULVECTOMY;  Surgeon: Everitt Amber, MD;  Location: [facility];  Service: Gynecology;  Laterality: N/A;   Patient Active Problem List   Diagnosis Date Noted   Urge incontinence 06/11/2020   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[place]) 05/09/2020   Encounter for counseling 03/18/2019   Symptomatic mammary hypertrophy 03/18/2019   Neck pain 03/18/2019   Back pain 03/18/2019   Vulval cellulitis 92/03/9416   Lichen simplex chronicus 09/26/2014   Chest pain 10/21/2013   Dyspnea 10/21/2013   GERD  (gastroesophageal reflux disease) 10/21/2013   Idiopathic scoliosis 10/21/2013   Personal history of colonic polyps 10/21/2013   Genital atrophy of female 07/02/2012    REFERRING DIAG: REFERRING DIAG: M41.9 (ICD-10-CM) - Scoliosis, unspecified   THERAPY DIAG:  Abnormal posture  Chronic pain of right knee  Difficulty in walking, not elsewhere classified  PERTINENT HISTORY:  hyperlipidemia, primary hypertension hypertension, headache, dyspnea, GERD, Paget's disease    SUBJECTIVE: No complaints or concerns, no falls or pain   OBJECTIVE:    PAIN:  Are you having pain? yes VAS scale: 0/10 Pain location: low back and Rt lateral hip Aggravating factors: walk/stop, standing for a while, very cautious with bending Relieving factors: lay flat, lay on my back with legs up on wall   PRECAUTIONS: None  PATIENT EDUCATION:  Education details: progression of scoliosis & use of exercises to support Person educated: Patient Education method: Consulting civil engineer, Demonstration, Tactile cues, Verbal cues, and Handouts Education comprehension: verbalized understanding, returned demonstration, verbal cues required, tactile cues required, and needs further education  DIAGNOSTIC FINDINGS:  MRI from 2017 1- mulitlevel degen changes with a mod retrolevoscoliosis of lumbar spine 2-L2-3 mild mod Rt lateral recess stenosis with possible encroachment of Rt L3 root 3- L3-4 mild to mod central canal stenosis and bil lateral recess stenosis with possible encroachment on L4 roots 4-L4-5 mod to severe Lt lateral recess stenosis with probably encroachment on Lt L5 root. Mod narrowing of Lt neural foramen with mass effect on the Lt L4 dorsal root ganglion. 5-L5-S1 abnormal contact bw the transverse process of L5 and Lt sacral ala related scoliosis. 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 No neural displacement or encrachment.    PATIENT SURVEYS:  LEFS 59   COGNITION:          Overall cognitive status: Within  functional limits for tasks assessed                        SENSATION:          WFL   MUSCLE LENGTH: WFL   POSTURE:  Lumbar levoscoliosis, thoraco dextroscoliosis, Rt hip elevation   LUMBARAROM/PROM   General mobility is [facility] but has increased flexibility grossly   LE MMT: whole body compensation with MMT to RLE   A/PROM Right 02/18/2021 Left 02/18/2021 Right  11/11  Hip flexion _0 Hip extension       Hip abduction 4- 5 4+  Hip adduction       Hip internal rotation       Hip external rotation       Knee flexion       Knee extension       Ankle dorsiflexion       Ankle plantarflexion  Ankle inversion       Ankle eversion        (Blank rows = not tested)   GAIT: [facility]      HOME EXERCISE PROGRAM: PRI 90-90 hip lift with Rt arm reach, Lt sidelying knee toward knee, Standing unsupported right lift with right trunk rotation [id number].  Added aquatics in [place]:   CLINICAL IMPRESSION: Pt received laminated HEP.  Added side planks with concentration towards left. Pt with excellent toleration of session. She is just about indep with aquatic HEP. Reports indep and compliance with on land HEP.  Will plan on DC next visit     GOALS: Goals reviewed with patient? Yes   SHORT TERM GOALS:   STG Name Target Date Goal status  1 Pt will perform PRI exercise daily for stability to curve Baseline: feel like I am awful at it- awkward 03/11/2021 Goal met 12/5  2 Pt will report reduced spasm in Lt lower back Baseline: started today after cooking 03/11/2021 ongoing                                                 LONG TERM GOALS:    LTG Name Target Date Goal status  1 Pt will be able to walk at least 4 miles without limitation of hip pain Baseline: begins around that mark 04/19/21 ongoing  2 Gross hip strength 5/5 Baseline: see outline 04/19/21 INITIAL Ongoing12/5  3 Will be able go shopping for at least 1 hour without limitation by hip  pain Baseline: stop and go walking around is the worst 04/19/21 INITIAL Ongoing12/5  4 LEFS to improve by [place] of 9 points Baseline: 59 at eval 04/19/21 INITIAL Ongoing12-9    PT FREQUENCY: 2x/week   PT DURATION: 6 weeks   PLANNED INTERVENTIONS: Therapeutic exercises, Therapeutic activity, Neuro Muscular re-education, Balance training, Gait training, Patient/Family education, Joint mobilization, Aquatic Therapy, Dry Needling, Moist heat, and Manual therapy      PAIN:  Are you having pain? No VAS scale: 0/10 Pain location: low back and Rt lateral hip Pain orientation:   PAIN TYPE:  Pain description:   Aggravating factors: walk/stop, standing for a while, very cautious with bending Relieving factors: lay flat, lay on my back with legs up on wall  TODAY'S TREATMENT:  12/7 Pt seen for aquatic therapy today.  Treatment took place in water 3.25-4.8 ft in depth at the [facility] pool. Temp of water was 91.  Pt entered/exited the pool via stairs step to pattern independently with bilat rail.   Warm up walking and kicking using noodle    Pilates: Side plank instruction and isometric holding Side plank pull down Sl side plank Side to side pendulum. Holding position for lateral core stretch R/L. Then strengthening 2x12  (QL stretch and strengthening) Sitting balance on yellow noodle gaining position then again with hands out of water after several tries  Initiation of reverse plank pushing squoodle below hips in seated      Plank isometric hold 3x30 sec     Mod plank lat pull down     Swimming in plank            Pt requires buoyancy for support and to offload joints with strengthening exercises. Viscosity of the water is needed for resistance of    strengthening; water current perturbations provides  challenge to standing balance unsupported, requiring increased core activation.  12/5 Pt seen for aquatic therapy today.  Treatment took place in water 3.25-4.8 ft in depth  at the [facility] pool. Temp of water was 91.  Pt entered/exited the pool via stairs step to pattern independently with bilat rail.   Standing Yoga stretch right QL 3 x30 sec hold   Pilates: Chest expansion (modified plank 3 x 30 seconds warm up) Lat pull 2x12 yellow noodle    Plank x 5 minutes holding best time 25 seconds Side to side pendulum. Holding position for lateral core stretch R/L. Then strengthening 2x12  (QL stretch and strengthening) Sitting balance on yellow noodle gaining position then again with hands out of water after several tries  Initiation of reverse plank pushing squoodle below hips in seated      Long leg hipf flex; extension; add/abd 2 x 12            Pt requires buoyancy for support and to offload joints with strengthening exercises. Viscosity of the water is needed for resistance of    strengthening; water current perturbations provides challenge to standing balance unsupported, requiring increased core activation.      PLAN FOR NEXT SESSION:  focusing on Lt rib flare reduction, Rt QL stretching & inhibition , HEP dc  Neera Teng (Frankie) Dilara Navarrete MPT 04/10/21 6:54 PM

## 2021-04-15 ENCOUNTER — Ambulatory Visit (HOSPITAL_BASED_OUTPATIENT_CLINIC_OR_DEPARTMENT_OTHER): Payer: Self-pay | Admitting: Physical Therapy

## 2021-04-17 ENCOUNTER — Encounter (HOSPITAL_BASED_OUTPATIENT_CLINIC_OR_DEPARTMENT_OTHER): Payer: Medicare Other | Admitting: Physical Therapy

## 2021-04-18 ENCOUNTER — Ambulatory Visit (HOSPITAL_BASED_OUTPATIENT_CLINIC_OR_DEPARTMENT_OTHER): Payer: Self-pay | Admitting: Physical Therapy

## 2021-04-18 ENCOUNTER — Encounter (HOSPITAL_BASED_OUTPATIENT_CLINIC_OR_DEPARTMENT_OTHER): Payer: Self-pay | Admitting: Physical Therapy

## 2021-04-18 ENCOUNTER — Other Ambulatory Visit: Payer: Self-pay

## 2021-04-18 ENCOUNTER — Ambulatory Visit
Admission: RE | Admit: 2021-04-18 | Discharge: 2021-04-18 | Disposition: A | Discharge status: Home / Self Care | Payer: Medicare Other | Source: Ambulatory Visit | Attending: Internal Medicine | Admitting: Internal Medicine

## 2021-04-18 ENCOUNTER — Ambulatory Visit (HOSPITAL_BASED_OUTPATIENT_CLINIC_OR_DEPARTMENT_OTHER): Payer: Medicare Other | Admitting: Physical Therapy

## 2021-04-18 DIAGNOSIS — M25551 Pain in right hip: Secondary | ICD-10-CM

## 2021-04-18 DIAGNOSIS — M6281 Muscle weakness (generalized): Secondary | ICD-10-CM

## 2021-04-18 DIAGNOSIS — R293 Abnormal posture: Secondary | ICD-10-CM | POA: Diagnosis not present

## 2021-04-18 DIAGNOSIS — G8929 Other chronic pain: Secondary | ICD-10-CM

## 2021-04-18 DIAGNOSIS — R262 Difficulty in walking, not elsewhere classified: Secondary | ICD-10-CM

## 2021-04-18 DIAGNOSIS — Z1231 Encounter for screening mammogram for malignant neoplasm of breast: Secondary | ICD-10-CM

## 2021-04-18 NOTE — Therapy (Signed)
OUTPATIENT PHYSICAL THERAPY TREATMENT NOTE       Patient Name: Stephanie Sanchez MRN: [id number] DOB:Jul 11, 1955, 65 y.o., female Today's Date: 04/18/2021  PCP: Crist Infante, MD REFERRING PROVIDER: Crist Infante, MD   PT End of Session - 04/18/21 1430     Visit Number 12    Number of Visits 13    Date for PT Re-Evaluation 04/19/21    Authorization Type MCR    Progress Note Due on Visit 10    PT Start Time [place] During Treatment Other (comment)    Activity Tolerance Patient tolerated treatment well    Behavior During Therapy [facility] for tasks assessed/performed               Past Medical History:  Diagnosis Date   Arthritis    Coronary artery disease involving native coronary artery    cardiologist--- dr h. 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[place])    first dx 2013;  s/p  WLE and Vulvectomy's   PONV (postoperative nausea and vomiting)    Psoriasis    Scoliosis    cervicothoracic region   Past Surgical History:  Procedure Laterality Date   BLADDER SUSPENSION  2008   sling   BREAST EXCISIONAL BIOPSY Left    BUNIONECTOMY  2008   CARDIOVASCULAR STRESS TEST  09-30-2013   dr Marlou Porch   normal nuclear study/  no ischemia/  normal LV function and wall motion,  ef 61%   CATARACT EXTRACTION W/ INTRAOCULAR LENS  IMPLANT, BILATERAL  2016   COLONOSCOPY WITH ESOPHAGOGASTRODUODENOSCOPY (EGD)  04-30-2020  [facility] [place]   HAND SURGERY Right 11/02/12   hand reconstruction at [place] EXCISIONAL BREAST BIOPSY Right 11/09/2018    Procedure: RADIOACTIVE SEED GUIDED EXCISIONAL RIGHT BREAST BIOPSY AND RIGHT AREOLA BIOPSY;  Surgeon: Rolm Bookbinder, MD;  Location: [place];  Service: General;  Laterality: Right;   TONSILLECTOMY  1973  age 16   [place]  age 44   VULVA [place] BIOPSY N/A 08/29/2014   Procedure: Georgena Spurling;  Surgeon: Everitt Amber, MD;  Location: [place];  Service: Gynecology;  Laterality: N/A;   VULVECTOMY N/A 08/29/2014   Procedure: WIDE LOCAL EXCISION OF VULVA;  Surgeon: Everitt Amber, MD;  Location: [place];  Service: Gynecology;  Laterality: N/A;   VULVECTOMY N/A 05/04/2015   Procedure: WIDE LOCAL EXCISION LEFT  VULVAR WITH BIOPSY OF RIGHT VULVA;  Surgeon: Everitt Amber, MD;  Location: [place];  Service: Gynecology;  Laterality: N/A;   VULVECTOMY Left 01/22/2016   Procedure: PARTIAL SIMPLE VULVECTOMY;  Surgeon: Everitt Amber, MD;  Location: [facility];  Service: Gynecology;  Laterality: Left;   VULVECTOMY N/A 02/14/2016   Procedure: WIDE EXCISION VULVECTOMY;  Surgeon: Everitt Amber, MD;  Location: [facility];  Service: Gynecology;  Laterality: N/A;   VULVECTOMY N/A 05/09/2020   Procedure: WIDE LOCAL EXCISION  VULVECTOMY;  Surgeon: Everitt Amber, MD;  Location: [facility];  Service: Gynecology;  Laterality: N/A;   Patient Active Problem List   Diagnosis Date Noted   Urge incontinence 06/11/2020   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[place]) 05/09/2020   Encounter for counseling 03/18/2019   Symptomatic mammary hypertrophy 03/18/2019   Neck pain 03/18/2019   Back pain 03/18/2019   Vulval cellulitis 81/19/1478   Lichen simplex chronicus 09/26/2014   Chest pain 10/21/2013   Dyspnea 10/21/2013   GERD (gastroesophageal reflux disease) 10/21/2013   Idiopathic scoliosis 10/21/2013   Personal history of colonic polyps 10/21/2013   Genital atrophy of female 07/02/2012    REFERRING DIAG:  REFERRING DIAG: M41.9 (ICD-10-CM) - Scoliosis, unspecified   THERAPY DIAG:  Abnormal posture  Chronic pain of right knee  Difficulty in walking, not elsewhere classified  Muscle weakness (generalized)  Pain in right hip  PERTINENT HISTORY:  hyperlipidemia, primary hypertension hypertension, headache, dyspnea, GERD, Paget's disease    SUBJECTIVE: Pt states the pain has gotten worse today since last visit. She has L sided hip/back spasm. Pt states she is here for DN due to increased pain and spasm. She is traveling this weekend out of town.    OBJECTIVE:    PAIN:  Are you having pain? yes VAS scale: 5/10, reduced to 2/10 by end of session Pain location: low back and L lateral hip Aggravating factors: walk/stop, standing for a while, very cautious with bending Relieving factors: lay flat, lay on my back with legs up on wall   PRECAUTIONS: None  PATIENT EDUCATION:  Education details: TPDN with verbal consent given, anatomy, exercise progression, DOMS expectations, muscle firing,  envelope of function Person educated: Patient Education method: Explanation, Demonstration, Tactile cues, Verbal cues, and Handouts Education comprehension: verbalized understanding, returned demonstration, verbal cues required, tactile cues required, and needs further education  DIAGNOSTIC FINDINGS:  MRI from 2017 1- mulitlevel degen changes with a mod retrolevoscoliosis of lumbar spine 2-L2-3 mild mod Rt lateral recess stenosis with possible encroachment of Rt L3 root 3- L3-4 mild to mod central canal stenosis and bil lateral recess stenosis with possible encroachment on L4 roots 4-L4-5 mod to severe Lt lateral recess stenosis with probably encroachment on Lt L5 root. Mod narrowing of Lt neural foramen with mass effect on the Lt L4 dorsal root ganglion. 5-L5-S1 abnormal contact bw the transverse process of L5 and Lt sacral ala related scoliosis. 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 No neural  displacement or encrachment.    PATIENT SURVEYS:  LEFS 59      HOME EXERCISE PROGRAM: PRI 90-90 hip lift with Rt arm reach, Lt sidelying knee toward knee, Standing unsupported right lift with right trunk rotation [id number].  Added aquatics in [employer]   ASSESSMENT:   CLINICAL IMPRESSION:  Pt with acute exacerbation of pain recently. Session used to reduce pain so that pt is able to travel over the long weekend. Due to pt report of increased pain with longer duration walking, pt does not feel D/C is appropriate today. Pt had good tolerance to manual therapy, TPDN, as well as gentle lumbar mobility exercise. Pt given edu about pacing of activity as well as building functional tolerance/endurance for prevention of future pain. Pt would benefit from continued skilled therapy in order to reach goals and maximize functional lumbopelvic strength and ROM for prevention of functional decline.       GOALS: Goals reviewed with patient? Yes   SHORT TERM GOALS:   STG  Name Target Date Goal status  1 Pt will perform PRI exercise daily for stability to curve Baseline: feel like I am awful at it- awkward 03/11/2021 Goal met 12/5  2 Pt will report reduced spasm in Lt lower back Baseline: started today after cooking 03/11/2021 ongoing                                                 LONG TERM GOALS:    LTG Name Target Date Goal status  1 Pt will be able to walk at least 4 miles without limitation of hip pain Baseline: begins around that mark 04/19/21 ongoing  2 Gross hip strength 5/5 Baseline: see outline 04/19/21 INITIAL Ongoing12/5  3 Will be able go shopping for at least 1 hour without limitation by hip pain Baseline: stop and go walking around is the worst 04/19/21 INITIAL Ongoing12/5  4 LEFS to improve by [place] of 9 points Baseline: 59 at eval 04/19/21 INITIAL Ongoing12-9    PT FREQUENCY: 2x/week   PT DURATION: 6 weeks   PLANNED INTERVENTIONS: Therapeutic exercises, Therapeutic  activity, Neuro Muscular re-education, Balance training, Gait training, Patient/Family education, Joint mobilization, Aquatic Therapy, Dry Needling, Moist heat, and Manual therapy      PAIN:  Are you having pain? No VAS scale: 0/10 Pain location: low back and Rt lateral hip Pain orientation:   PAIN TYPE:  Pain description:   Aggravating factors: walk/stop, standing for a while, very cautious with bending Relieving factors: lay flat, lay on my back with legs up on wall  TODAY'S TREATMENT:  12/15  TPDN of L lumbar multifidi L3-5; skilled palpation and assessment of soft tissue response throughout  Manual: xfriction and deep ischemic pressure to L lumbar paraspinals, QL  Exercise: pelvic tilting seated 15x; PPT supine 10x; bridge with PPT 10x; self massage with tennis ball  12/7 Pt seen for aquatic therapy today.  Treatment took place in water 3.25-4.8 ft in depth at the [facility] pool. Temp of water was 91.  Pt entered/exited the pool via stairs step to pattern independently with bilat rail.   Warm up walking and kicking using noodle    Pilates: Side plank instruction and isometric holding Side plank pull down Sl side plank Side to side pendulum. Holding position for lateral core stretch R/L. Then strengthening 2x12  (QL stretch and strengthening) Sitting balance on yellow noodle gaining position then again with hands out of water after several tries  Initiation of reverse plank pushing squoodle below hips in seated      Plank isometric hold 3x30 sec     Mod plank lat pull down     Swimming in plank            Pt requires buoyancy for support and to offload joints with strengthening exercises. Viscosity of the water is needed for resistance of    strengthening; water current perturbations provides challenge to standing balance unsupported, requiring increased core activation.  12/5 Pt seen for aquatic therapy today.  Treatment took place in water 3.25-4.8 ft in  depth at the [facility] pool. Temp of water was 91.  Pt entered/exited the pool via stairs step to pattern independently with bilat rail.   Standing Yoga stretch right QL 3 x30 sec hold   Pilates: Chest expansion (modified plank 3 x 30 seconds warm up) Lat pull 2x12  yellow noodle    Plank x 5 minutes holding best time 25 seconds Side to side pendulum. Holding position for lateral core stretch R/L. Then strengthening 2x12  (QL stretch and strengthening) Sitting balance on yellow noodle gaining position then again with hands out of water after several tries  Initiation of reverse plank pushing squoodle below hips in seated      Long leg hipf flex; extension; add/abd 2 x 12            Pt requires buoyancy for support and to offload joints with strengthening exercises. Viscosity of the water is needed for resistance of    strengthening; water current perturbations provides challenge to standing balance unsupported, requiring increased core activation.      PLAN FOR NEXT SESSION:  focusing on Lt rib flare reduction, Rt QL stretching & inhibition , HEP D/C as able  Daleen Bo PT, DPT 04/18/21 3:18 PM

## 2021-04-25 ENCOUNTER — Encounter (HOSPITAL_BASED_OUTPATIENT_CLINIC_OR_DEPARTMENT_OTHER): Payer: Self-pay | Admitting: Physical Therapy

## 2021-05-03 ENCOUNTER — Ambulatory Visit (HOSPITAL_BASED_OUTPATIENT_CLINIC_OR_DEPARTMENT_OTHER): Payer: Medicare Other | Admitting: Physical Therapy

## 2021-05-08 ENCOUNTER — Ambulatory Visit (HOSPITAL_BASED_OUTPATIENT_CLINIC_OR_DEPARTMENT_OTHER): Payer: Medicare Other | Admitting: Physical Therapy

## 2021-05-12 ENCOUNTER — Encounter (HOSPITAL_BASED_OUTPATIENT_CLINIC_OR_DEPARTMENT_OTHER): Payer: Self-pay | Admitting: Physical Therapy

## 2021-05-13 ENCOUNTER — Ambulatory Visit (HOSPITAL_BASED_OUTPATIENT_CLINIC_OR_DEPARTMENT_OTHER): Payer: Medicare Other | Attending: Orthopedic Surgery | Admitting: Physical Therapy

## 2021-05-13 ENCOUNTER — Encounter (HOSPITAL_BASED_OUTPATIENT_CLINIC_OR_DEPARTMENT_OTHER): Payer: Self-pay | Admitting: Physical Therapy

## 2021-05-13 ENCOUNTER — Other Ambulatory Visit: Payer: Self-pay

## 2021-05-13 ENCOUNTER — Ambulatory Visit (HOSPITAL_BASED_OUTPATIENT_CLINIC_OR_DEPARTMENT_OTHER): Payer: Medicare Other | Admitting: Physical Therapy

## 2021-05-13 DIAGNOSIS — R262 Difficulty in walking, not elsewhere classified: Secondary | ICD-10-CM

## 2021-05-13 DIAGNOSIS — G8929 Other chronic pain: Secondary | ICD-10-CM | POA: Diagnosis present

## 2021-05-13 DIAGNOSIS — M25551 Pain in right hip: Secondary | ICD-10-CM

## 2021-05-13 DIAGNOSIS — M25561 Pain in right knee: Secondary | ICD-10-CM | POA: Insufficient documentation

## 2021-05-13 DIAGNOSIS — R293 Abnormal posture: Secondary | ICD-10-CM | POA: Diagnosis present

## 2021-05-13 DIAGNOSIS — M6281 Muscle weakness (generalized): Secondary | ICD-10-CM

## 2021-05-13 NOTE — Therapy (Signed)
OUTPATIENT PHYSICAL THERAPY RE-CERT and D/C NOTE       Patient Name: Stephanie Sanchez MRN: [id number] DOB:28-Feb-1956, 66 y.o., female Today's Date: 05/13/2021  PCP: Crist Infante, MD REFERRING PROVIDER: Crist Infante, MD   PT End of Session - 05/13/21 1420     Visit Number 13    Number of Visits 13    Date for PT Re-Evaluation 05/13/21    Authorization Type MCR    Progress Note Due on Visit 10    PT Start Time 3845    PT Stop Time 1510    PT Time Calculation (min) 45 min    Equipment Utilized During Treatment Other (comment)    Activity Tolerance Patient tolerated treatment well    Behavior During Therapy [facility] for tasks assessed/performed               Past Medical History:  Diagnosis Date   Arthritis    Coronary artery disease involving native coronary artery    cardiologist--- dr h. 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[place])    first dx 2013;  s/p  WLE and Vulvectomy's   PONV (postoperative nausea and vomiting)    Psoriasis    Scoliosis    cervicothoracic region   Past Surgical History:  Procedure Laterality Date   BLADDER SUSPENSION  2008   sling   BREAST EXCISIONAL BIOPSY Left    BUNIONECTOMY  2008   CARDIOVASCULAR STRESS TEST  09-30-2013   dr Marlou Porch   normal nuclear study/  no ischemia/  normal LV function and wall motion,  ef 61%   CATARACT EXTRACTION W/ INTRAOCULAR LENS  IMPLANT, BILATERAL  2016   COLONOSCOPY WITH ESOPHAGOGASTRODUODENOSCOPY (EGD)  04-30-2020  [facility] [place]   HAND SURGERY Right 11/02/12   hand reconstruction at [place] EXCISIONAL BREAST BIOPSY Right 11/09/2018   Procedure: RADIOACTIVE SEED GUIDED EXCISIONAL RIGHT BREAST BIOPSY AND RIGHT AREOLA BIOPSY;  Surgeon: Rolm Bookbinder, MD;  Location: [place];  Service: General;  Laterality: Right;   TONSILLECTOMY  1973  age 86   [place]  age 52   VULVA [place] BIOPSY N/A 08/29/2014   Procedure: Georgena Spurling;  Surgeon: Everitt Amber, MD;  Location: [place];  Service: Gynecology;  Laterality: N/A;   VULVECTOMY N/A 08/29/2014   Procedure: WIDE LOCAL EXCISION OF VULVA;  Surgeon: Everitt Amber, MD;  Location: [place];  Service: Gynecology;  Laterality: N/A;   VULVECTOMY N/A 05/04/2015   Procedure: WIDE LOCAL EXCISION LEFT  VULVAR WITH BIOPSY OF RIGHT VULVA;  Surgeon: Everitt Amber, MD;  Location: [place];  Service: Gynecology;  Laterality: N/A;   VULVECTOMY Left 01/22/2016   Procedure: PARTIAL SIMPLE VULVECTOMY;  Surgeon: Everitt Amber, MD;  Location: [facility];  Service: Gynecology;  Laterality: Left;   VULVECTOMY N/A 02/14/2016   Procedure: WIDE EXCISION VULVECTOMY;  Surgeon: Everitt Amber, MD;  [facility];  Service: Gynecology;  Laterality: N/A;   VULVECTOMY N/A 05/09/2020   Procedure: WIDE LOCAL EXCISION VULVECTOMY;  Surgeon: Everitt Amber, MD;  Location: [facility];  Service: Gynecology;  Laterality: N/A;   Patient Active Problem List   Diagnosis Date Noted   Urge incontinence 06/11/2020   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[place]) 05/09/2020   Encounter for counseling 03/18/2019   Symptomatic mammary hypertrophy 03/18/2019   Neck pain 03/18/2019   Back pain 03/18/2019   Vulval cellulitis 35/57/3220   Lichen simplex chronicus 09/26/2014   Chest pain 10/21/2013   Dyspnea 10/21/2013   GERD (gastroesophageal reflux disease) 10/21/2013   Idiopathic scoliosis 10/21/2013   Personal history of colonic polyps 10/21/2013    Genital atrophy of female 07/02/2012    REFERRING DIAG: REFERRING DIAG: M41.9 (ICD-10-CM) - Scoliosis, unspecified   THERAPY DIAG:  Abnormal posture - Plan: PT plan of care cert/re-cert  Chronic pain of right knee - Plan: PT plan of care cert/re-cert  Difficulty in walking, not elsewhere classified - Plan: PT plan of care cert/re-cert  Muscle weakness (generalized) - Plan: PT plan of care cert/re-cert  Pain in right hip - Plan: PT plan of care cert/re-cert  PERTINENT HISTORY:  hyperlipidemia, primary hypertension hypertension, headache, dyspnea, GERD, Paget's disease    SUBJECTIVE: Pt states the some of the HEP will cause increased spasm and pain. It will become a cramp.    PAIN:  Are you having pain? yes VAS scale: 3/10 Pain location: low back and L lateral hip Aggravating factors: walk/stop, standing for a while, very cautious with bending Relieving factors: lay flat, lay on my back with legs up on wall   OBJECTIVE:   LUMBARAROM/PROM   Flexion WFL Ext WFL L rot p! WFL- improved to no pain following treatment R rot  WFL  R SB p! WFL- improved to no pain following treatment  L SB p! WFL- improved to no pain following treatment   LE MMT: whole body compensation with MMT to RLE   A/PROM Right 02/18/2021 Left 02/18/2021  Hip flexion 4 4  Hip extension      Hip abduction 4+ 4  Hip adduction  5 5   Hip internal rotation  4 4   Hip external rotation  4  4  Knee flexion 5   5  Knee extension  5 5    (Blank rows = not tested)       PRECAUTIONS: None  PATIENT EDUCATION:  Education details: TPDN with verbal consent given, anatomy, exercise progression, DOMS expectations, muscle firing,  envelope of function, review of self stretching diagram, D/C plans Person educated: Patient Education method: Explanation, Demonstration, Tactile cues, Verbal cues, and Handouts Education comprehension: verbalized understanding, returned demonstration, verbal cues required,  tactile cues required, and needs further education  DIAGNOSTIC FINDINGS:  MRI from 2017 1- mulitlevel degen changes with a mod retrolevoscoliosis of lumbar spine 2-L2-3 mild mod Rt lateral recess stenosis with possible encroachment of Rt L3 root 3- L3-4 mild to mod central canal stenosis and bil lateral recess stenosis with possible encroachment on L4 roots 4-L4-5 mod to severe Lt lateral recess stenosis with probably encroachment on Lt L5 root. Mod narrowing of Lt neural foramen with mass effect on the Lt L4 dorsal root ganglion. 5-L5-S1 abnormal contact bw the transverse process of L5 and Lt sacral ala related scoliosis. 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 No neural displacement or encrachment.    PATIENT SURVEYS:  LEFS 59  HOME EXERCISE PROGRAM: PRI 90-90 hip lift with Rt arm reach, Lt sidelying knee toward knee, Standing unsupported right lift with right trunk rotation [id number].  Added aquatics in [place]:   CLINICAL IMPRESSION:  Pt re-certification done in order to encompass pt's treatment session today.   Pt to be in surgery on 05/22/21 and will be recovering for 6 weeks following. Plan to D/C this current episode of care and resume when pt is able to return to therapy. Pt continues to have good response to pain management in clinic. D/C this episode of care.       GOALS: Goals reviewed with patient? Yes   SHORT TERM GOALS:   STG Name Target Date Goal status  1 Pt will perform PRI exercise daily for stability to curve Baseline: feel like I am awful at it- awkward 03/11/2021 Goal met 12/5  2 Pt will report reduced spasm in Lt lower back Baseline: started today after cooking 03/11/2021 MET                                                 LONG TERM GOALS:    LTG Name Target Date Goal status  1 Pt will be able to walk at least 4 miles without limitation of hip pain Baseline: begins around that mark 04/19/21 Partially met  2 Gross hip strength  5/5 Baseline: see outline 04/19/21 Not met  3 Will be able go shopping for at least 1 hour without limitation by hip pain Baseline: stop and go walking around is the worst 04/19/21 Partially met  4 LEFS to improve by [place] of 9 points Baseline: 59 at eval 04/19/21 Unable to be assessed    PT FREQUENCY: 1x/week   PT DURATION: 1 weeks   PLANNED INTERVENTIONS: Therapeutic exercises, Therapeutic activity, Neuro Muscular re-education, Balance training, Gait training, Patient/Family education, Joint mobilization, Aquatic Therapy, Dry Needling, Moist heat, and Manual therapy      PAIN:  Are you having pain? No VAS scale: 0/10 Pain location: low back and Rt lateral hip Pain orientation:   PAIN TYPE:  Pain description:   Aggravating factors: walk/stop, standing for a while, very cautious with bending Relieving factors: lay flat, lay on my back with legs up on wall  TODAY'S TREATMENT: 05/13/21  TPDN of L lumbar multifidi L3-5; skilled palpation and assessment of soft tissue response throughout  Manual: xfriction and deep ischemic pressure to L lumbar paraspinals, QL  Exercise: review of HEP as well as review of self stretching chart and safe parameters  12/15  TPDN of L lumbar multifidi L3-5; skilled palpation and assessment of soft tissue response throughout  Manual: xfriction and deep ischemic pressure to L lumbar paraspinals, QL  Exercise: pelvic tilting seated 15x; PPT supine 10x; bridge with PPT 10x; self massage with tennis ball  12/7 Pt seen for aquatic therapy today.  Treatment took place in water 3.25-4.8 ft in depth at the [facility] pool. Temp of water was 91.  Pt entered/exited the pool via stairs step to pattern independently with bilat rail.   Warm up walking and kicking using noodle    Pilates: Side plank instruction and isometric holding Side plank pull down Sl side plank Side to side pendulum. Holding position for lateral core stretch R/L. Then  strengthening 2x12  (QL stretch and strengthening) Sitting balance on yellow  noodle gaining position then again with hands out of water after several tries  Initiation of reverse plank pushing squoodle below hips in seated      Plank isometric hold 3x30 sec     Mod plank lat pull down     Swimming in plank            Pt requires buoyancy for support and to offload joints with strengthening exercises. Viscosity of the water is needed for resistance of    strengthening; water current perturbations provides challenge to standing balance unsupported, requiring increased core activation.  12/5 Pt seen for aquatic therapy today.  Treatment took place in water 3.25-4.8 ft in depth at the [facility] pool. Temp of water was 91.  Pt entered/exited the pool via stairs step to pattern independently with bilat rail.   Standing Yoga stretch right QL 3 x30 sec hold   Pilates: Chest expansion (modified plank 3 x 30 seconds warm up) Lat pull 2x12 yellow noodle    Plank x 5 minutes holding best time 25 seconds Side to side pendulum. Holding position for lateral core stretch R/L. Then strengthening 2x12  (QL stretch and strengthening) Sitting balance on yellow noodle gaining position then again with hands out of water after several tries  Initiation of reverse plank pushing squoodle below hips in seated      Long leg hipf flex; extension; add/abd 2 x 12            Pt requires buoyancy for support and to offload joints with strengthening exercises. Viscosity of the water is needed for resistance of    strengthening; water current perturbations provides challenge to standing balance unsupported, requiring increased core activation.     Daleen Bo PT, DPT 05/13/21 3:29 PM

## 2021-05-14 ENCOUNTER — Encounter: Payer: Medicare Other | Admitting: Surgical

## 2021-05-15 ENCOUNTER — Ambulatory Visit (HOSPITAL_BASED_OUTPATIENT_CLINIC_OR_DEPARTMENT_OTHER): Payer: Medicare Other | Admitting: Physical Therapy

## 2021-05-21 ENCOUNTER — Ambulatory Visit: Payer: Medicare Other | Admitting: Plastic Surgery

## 2021-05-31 ENCOUNTER — Ambulatory Visit: Payer: Medicare Other | Admitting: Plastic Surgery

## 2021-06-05 ENCOUNTER — Ambulatory Visit (HOSPITAL_BASED_OUTPATIENT_CLINIC_OR_DEPARTMENT_OTHER): Admit: 2021-06-05 | Payer: Medicare Other | Admitting: Plastic Surgery

## 2021-06-05 ENCOUNTER — Encounter (HOSPITAL_BASED_OUTPATIENT_CLINIC_OR_DEPARTMENT_OTHER): Payer: Self-pay

## 2021-06-05 SURGERY — MAMMOPLASTY, REDUCTION
Anesthesia: General | Site: Breast | Laterality: Bilateral

## 2021-06-13 ENCOUNTER — Other Ambulatory Visit: Payer: Self-pay

## 2021-06-13 ENCOUNTER — Other Ambulatory Visit (HOSPITAL_COMMUNITY): Payer: Self-pay | Admitting: Orthopedic Surgery

## 2021-06-13 ENCOUNTER — Encounter (HOSPITAL_BASED_OUTPATIENT_CLINIC_OR_DEPARTMENT_OTHER): Payer: Self-pay | Admitting: Orthopedic Surgery

## 2021-06-14 ENCOUNTER — Encounter: Payer: Medicare Other | Admitting: Plastic Surgery

## 2021-06-17 ENCOUNTER — Encounter (HOSPITAL_BASED_OUTPATIENT_CLINIC_OR_DEPARTMENT_OTHER)
Admission: RE | Admit: 2021-06-17 | Discharge: 2021-06-17 | Disposition: A | Discharge status: Home / Self Care | Payer: Medicare Other | Source: Ambulatory Visit | Attending: Orthopedic Surgery | Admitting: Orthopedic Surgery

## 2021-06-17 DIAGNOSIS — Z01812 Encounter for preprocedural laboratory examination: Secondary | ICD-10-CM | POA: Insufficient documentation

## 2021-06-17 DIAGNOSIS — I10 Essential (primary) hypertension: Secondary | ICD-10-CM | POA: Insufficient documentation

## 2021-06-17 LAB — BASIC METABOLIC PANEL
Anion gap: 9 (ref 5–15)
BUN: 19 mg/dL (ref 8–23)
CO2: 26 mmol/L (ref 22–32)
Calcium: 9.4 mg/dL (ref 8.9–10.3)
Chloride: 106 mmol/L (ref 98–111)
Creatinine, Ser: 0.76 mg/dL (ref 0.44–1.00)
GFR, Estimated: >60 mL/min (ref >60)
Glucose, Bld: 87 mg/dL (ref 70–99)
Potassium: 5.6 mmol/L — ABNORMAL HIGH (ref 3.5–5.1)
Sodium: 141 mmol/L (ref 135–145)

## 2021-06-17 NOTE — Progress Notes (Signed)

## 2021-06-19 NOTE — Progress Notes (Signed)
Reviewed patient's chart with Dr. Ambrose Pancoast regarding potassium of 5.6 on 06/17/21. Per Dr. Ambrose Pancoast repeat BMP on DOS.

## 2021-06-20 ENCOUNTER — Encounter (HOSPITAL_BASED_OUTPATIENT_CLINIC_OR_DEPARTMENT_OTHER): Payer: Self-pay | Admitting: Orthopedic Surgery

## 2021-06-20 ENCOUNTER — Encounter (HOSPITAL_BASED_OUTPATIENT_CLINIC_OR_DEPARTMENT_OTHER)
Admission: RE | Disposition: A | Discharge status: Home / Self Care | Payer: Self-pay | Source: Home / Self Care | Attending: Orthopedic Surgery

## 2021-06-20 ENCOUNTER — Ambulatory Visit (HOSPITAL_BASED_OUTPATIENT_CLINIC_OR_DEPARTMENT_OTHER): Payer: Medicare Other | Admitting: Certified Registered"

## 2021-06-20 ENCOUNTER — Other Ambulatory Visit: Payer: Self-pay

## 2021-06-20 ENCOUNTER — Ambulatory Visit (HOSPITAL_BASED_OUTPATIENT_CLINIC_OR_DEPARTMENT_OTHER)
Admission: RE | Admit: 2021-06-20 | Discharge: 2021-06-20 | Disposition: A | Discharge status: Home / Self Care | Payer: Medicare Other | Attending: Orthopedic Surgery | Admitting: Orthopedic Surgery

## 2021-06-20 DIAGNOSIS — E875 Hyperkalemia: Secondary | ICD-10-CM

## 2021-06-20 DIAGNOSIS — M67472 Ganglion, left ankle and foot: Secondary | ICD-10-CM

## 2021-06-20 DIAGNOSIS — M67479 Ganglion, unspecified ankle and foot: Secondary | ICD-10-CM

## 2021-06-20 DIAGNOSIS — I10 Essential (primary) hypertension: Secondary | ICD-10-CM

## 2021-06-20 DIAGNOSIS — Z87891 Personal history of nicotine dependence: Secondary | ICD-10-CM | POA: Diagnosis not present

## 2021-06-20 DIAGNOSIS — M65872 Other synovitis and tenosynovitis, left ankle and foot: Secondary | ICD-10-CM

## 2021-06-20 DIAGNOSIS — I2510 Atherosclerotic heart disease of native coronary artery without angina pectoris: Secondary | ICD-10-CM

## 2021-06-20 DIAGNOSIS — Z8544 Personal history of malignant neoplasm of other female genital organs: Secondary | ICD-10-CM | POA: Insufficient documentation

## 2021-06-20 DIAGNOSIS — M1990 Unspecified osteoarthritis, unspecified site: Secondary | ICD-10-CM | POA: Insufficient documentation

## 2021-06-20 DIAGNOSIS — K219 Gastro-esophageal reflux disease without esophagitis: Secondary | ICD-10-CM | POA: Insufficient documentation

## 2021-06-20 DIAGNOSIS — M71372 Other bursal cyst, left ankle and foot: Secondary | ICD-10-CM | POA: Insufficient documentation

## 2021-06-20 DIAGNOSIS — N762 Acute vulvitis: Secondary | ICD-10-CM

## 2021-06-20 HISTORY — PX: GANGLION CYST EXCISION: SHX1691

## 2021-06-20 HISTORY — DX: Hyperlipidemia, unspecified: E78.5

## 2021-06-20 HISTORY — DX: Essential (primary) hypertension: I10

## 2021-06-20 SURGERY — EXCISION, GANGLION CYST, FOOT
Anesthesia: Monitor Anesthesia Care | Site: Foot | Laterality: Left

## 2021-06-20 MED ORDER — PROPOFOL 500 MG/50ML IV EMUL
INTRAVENOUS | Status: AC
  Filled 2021-06-20: qty 100

## 2021-06-20 MED ORDER — 0.9 % SODIUM CHLORIDE (POUR BTL) OPTIME
TOPICAL | Status: DC | PRN
  Administered 2021-06-20: 120 mL

## 2021-06-20 MED ORDER — VANCOMYCIN HCL 500 MG IV SOLR
INTRAVENOUS | Status: DC | PRN
  Administered 2021-06-20: 500 mg via TOPICAL

## 2021-06-20 MED ORDER — CEFAZOLIN SODIUM-DEXTROSE 2-4 GM/100ML-% IV SOLN
2.0000 g | INTRAVENOUS | Status: AC
  Administered 2021-06-20: 2 g via INTRAVENOUS

## 2021-06-20 MED ORDER — OXYCODONE HCL 5 MG/5ML PO SOLN: 5.0000 mg | Freq: Once | ORAL | Status: DC | PRN

## 2021-06-20 MED ORDER — HYDROMORPHONE HCL 1 MG/ML IJ SOLN: 0.2500 mg | INTRAMUSCULAR | Status: DC | PRN

## 2021-06-20 MED ORDER — FENTANYL CITRATE (PF) 100 MCG/2ML IJ SOLN
INTRAMUSCULAR | Status: AC
  Filled 2021-06-20: qty 2

## 2021-06-20 MED ORDER — LACTATED RINGERS IV SOLN: INTRAVENOUS | Status: DC

## 2021-06-20 MED ORDER — BUPIVACAINE-EPINEPHRINE (PF) 0.5% -1:200000 IJ SOLN
INTRAMUSCULAR | Status: AC
  Filled 2021-06-20: qty 30

## 2021-06-20 MED ORDER — EPHEDRINE 5 MG/ML INJ
INTRAVENOUS | Status: AC
  Filled 2021-06-20: qty 10

## 2021-06-20 MED ORDER — VANCOMYCIN HCL 500 MG IV SOLR
INTRAVENOUS | Status: AC
  Filled 2021-06-20: qty 10

## 2021-06-20 MED ORDER — PROMETHAZINE HCL 25 MG/ML IJ SOLN: 6.2500 mg | INTRAMUSCULAR | Status: DC | PRN

## 2021-06-20 MED ORDER — ROPIVACAINE HCL 5 MG/ML IJ SOLN
INTRAMUSCULAR | Status: DC | PRN
  Administered 2021-06-20: 30 mL via PERINEURAL

## 2021-06-20 MED ORDER — FENTANYL CITRATE (PF) 100 MCG/2ML IJ SOLN
100.0000 ug | Freq: Once | INTRAMUSCULAR | Status: AC
  Administered 2021-06-20: 100 ug via INTRAVENOUS

## 2021-06-20 MED ORDER — SCOPOLAMINE 1 MG/3DAYS TD PT72
MEDICATED_PATCH | TRANSDERMAL | Status: AC
Start: 2021-06-20 — End: ?
  Filled 2021-06-20: qty 1

## 2021-06-20 MED ORDER — MIDAZOLAM HCL 5 MG/5ML IJ SOLN
INTRAMUSCULAR | Status: DC | PRN
Start: 2021-06-20 — End: 2021-06-20
  Administered 2021-06-20: 2 mg via INTRAVENOUS

## 2021-06-20 MED ORDER — OXYCODONE HCL 5 MG PO TABS: 5.0000 mg | ORAL_TABLET | Freq: Once | ORAL | Status: DC | PRN

## 2021-06-20 MED ORDER — MIDAZOLAM HCL 2 MG/2ML IJ SOLN
INTRAMUSCULAR | Status: AC
  Filled 2021-06-20: qty 2

## 2021-06-20 MED ORDER — PHENYLEPHRINE 40 MCG/ML (10ML) SYRINGE FOR IV PUSH (FOR BLOOD PRESSURE SUPPORT)
PREFILLED_SYRINGE | INTRAVENOUS | Status: AC
  Filled 2021-06-20: qty 10

## 2021-06-20 MED ORDER — SODIUM CHLORIDE 0.9 % IV SOLN: INTRAVENOUS | Status: DC

## 2021-06-20 MED ORDER — FENTANYL CITRATE (PF) 100 MCG/2ML IJ SOLN
INTRAMUSCULAR | Status: DC | PRN
  Administered 2021-06-20: 25 ug via INTRAVENOUS

## 2021-06-20 MED ORDER — MIDAZOLAM HCL 2 MG/2ML IJ SOLN
2.0000 mg | Freq: Once | INTRAMUSCULAR | Status: AC
Start: 2021-06-20 — End: 2021-06-20
  Administered 2021-06-20: 2 mg via INTRAVENOUS

## 2021-06-20 MED ORDER — CEFAZOLIN SODIUM-DEXTROSE 2-4 GM/100ML-% IV SOLN
INTRAVENOUS | Status: AC
  Filled 2021-06-20: qty 100

## 2021-06-20 MED ORDER — DEXMEDETOMIDINE HCL IN NACL 80 MCG/20ML IV SOLN
INTRAVENOUS | Status: AC
  Filled 2021-06-20: qty 20

## 2021-06-20 MED ORDER — SCOPOLAMINE 1 MG/3DAYS TD PT72
1.0000 | MEDICATED_PATCH | TRANSDERMAL | Status: DC
  Administered 2021-06-20: 1.5 mg via TRANSDERMAL

## 2021-06-20 MED ORDER — TRAMADOL HCL 50 MG PO TABS: 50.0000 mg | ORAL_TABLET | Freq: Four times a day (QID) | ORAL | 0 refills | Status: AC | PRN

## 2021-06-20 MED ORDER — ONDANSETRON HCL 4 MG/2ML IJ SOLN
INTRAMUSCULAR | Status: DC | PRN
  Administered 2021-06-20: 4 mg via INTRAVENOUS

## 2021-06-20 MED ORDER — PROPOFOL 500 MG/50ML IV EMUL
INTRAVENOUS | Status: DC | PRN
  Administered 2021-06-20: 100 ug/kg/min via INTRAVENOUS

## 2021-06-20 SURGICAL SUPPLY — 64 items
APL PRP STRL LF DISP 70% ISPRP (MISCELLANEOUS) ×1
BANDAGE ESMARK 6X9 LF (GAUZE/BANDAGES/DRESSINGS) IMPLANT
BLADE MINI RND TIP GREEN BEAV (BLADE) ×1 IMPLANT
BLADE SURG 15 STRL LF DISP TIS (BLADE) ×1 IMPLANT
BLADE SURG 15 STRL SS (BLADE) ×2
BNDG CMPR 9X6 STRL LF SNTH (GAUZE/BANDAGES/DRESSINGS)
BNDG COHESIVE 4X5 TAN ST LF (GAUZE/BANDAGES/DRESSINGS) IMPLANT
BNDG COHESIVE 6X5 TAN ST LF (GAUZE/BANDAGES/DRESSINGS) IMPLANT
BNDG CONFORM 3 STRL LF (GAUZE/BANDAGES/DRESSINGS) ×1 IMPLANT
BNDG ESMARK 4X9 LF (GAUZE/BANDAGES/DRESSINGS) ×1 IMPLANT
BNDG ESMARK 6X9 LF (GAUZE/BANDAGES/DRESSINGS)
CHLORAPREP W/TINT 26 (MISCELLANEOUS) ×2 IMPLANT
CORD BIPOLAR FORCEPS 12FT (ELECTRODE) ×2 IMPLANT
COVER BACK TABLE 60X90IN (DRAPES) ×2 IMPLANT
CUFF TOURN SGL QUICK 34 (TOURNIQUET CUFF)
CUFF TRNQT CYL 34X4.125X (TOURNIQUET CUFF) IMPLANT
DRAPE EXTREMITY T 121X128X90 (DISPOSABLE) ×2 IMPLANT
DRAPE OEC MINIVIEW 54X84 (DRAPES) IMPLANT
DRAPE SURG 17X23 STRL (DRAPES) IMPLANT
DRAPE U-SHAPE 47X51 STRL (DRAPES) IMPLANT
DRSG MEPITEL 4X7.2 (GAUZE/BANDAGES/DRESSINGS) ×1 IMPLANT
DRSG PAD ABDOMINAL 8X10 ST (GAUZE/BANDAGES/DRESSINGS) ×2 IMPLANT
ELECT REM PT RETURN 9FT ADLT (ELECTROSURGICAL) ×2
ELECTRODE REM PT RTRN 9FT ADLT (ELECTROSURGICAL) ×1 IMPLANT
GAUZE 4X4 16PLY ~~LOC~~+RFID DBL (SPONGE) IMPLANT
GAUZE SPONGE 4X4 12PLY STRL (GAUZE/BANDAGES/DRESSINGS) ×4 IMPLANT
GLOVE SRG 8 PF TXTR STRL LF DI (GLOVE) ×2 IMPLANT
GLOVE SURG ENC MOIS LTX SZ8 (GLOVE) ×2 IMPLANT
GLOVE SURG LTX SZ8 (GLOVE) ×2 IMPLANT
GLOVE SURG UNDER POLY LF SZ8 (GLOVE) ×4
GOWN STRL REUS W/ TWL LRG LVL3 (GOWN DISPOSABLE) ×1 IMPLANT
GOWN STRL REUS W/ TWL XL LVL3 (GOWN DISPOSABLE) ×2 IMPLANT
GOWN STRL REUS W/TWL LRG LVL3 (GOWN DISPOSABLE) ×2
GOWN STRL REUS W/TWL XL LVL3 (GOWN DISPOSABLE) ×4
LOOP VESSEL MAXI BLUE (MISCELLANEOUS) IMPLANT
NDL HYPO 25X1 1.5 SAFETY (NEEDLE) IMPLANT
NEEDLE HYPO 22GX1.5 SAFETY (NEEDLE) IMPLANT
NEEDLE HYPO 25X1 1.5 SAFETY (NEEDLE) IMPLANT
NS IRRIG 1000ML POUR BTL (IV SOLUTION) ×2 IMPLANT
PACK BASIN DAY SURGERY FS (CUSTOM PROCEDURE TRAY) ×2 IMPLANT
PAD CAST 4YDX4 CTTN HI CHSV (CAST SUPPLIES) ×1 IMPLANT
PADDING CAST COTTON 4X4 STRL (CAST SUPPLIES) ×2
PADDING CAST COTTON 6X4 STRL (CAST SUPPLIES) IMPLANT
PENCIL SMOKE EVACUATOR (MISCELLANEOUS) ×2 IMPLANT
SANITIZER HAND PURELL 535ML FO (MISCELLANEOUS) ×2 IMPLANT
SHEET MEDIUM DRAPE 40X70 STRL (DRAPES) ×2 IMPLANT
SLEEVE SCD COMPRESS KNEE MED (STOCKING) ×2 IMPLANT
SPONGE T-LAP 18X18 ~~LOC~~+RFID (SPONGE) ×2 IMPLANT
STOCKINETTE 6  STRL (DRAPES) ×2
STOCKINETTE 6 STRL (DRAPES) ×1 IMPLANT
SUCTION FRAZIER HANDLE 10FR (MISCELLANEOUS)
SUCTION TUBE FRAZIER 10FR DISP (MISCELLANEOUS) IMPLANT
SUT ETHILON 3 0 PS 1 (SUTURE) IMPLANT
SUT ETHILON 4 0 PS 2 18 (SUTURE) IMPLANT
SUT MNCRL AB 3-0 PS2 18 (SUTURE) IMPLANT
SUT VIC AB 2-0 SH 27 (SUTURE)
SUT VIC AB 2-0 SH 27XBRD (SUTURE) IMPLANT
SUT VICRYL 0 SH 27 (SUTURE) IMPLANT
SYR BULB EAR ULCER 3OZ GRN STR (SYRINGE) ×2 IMPLANT
SYR CONTROL 10ML LL (SYRINGE) IMPLANT
TOWEL GREEN STERILE FF (TOWEL DISPOSABLE) ×2 IMPLANT
TUBE CONNECTING 20X1/4 (TUBING) IMPLANT
UNDERPAD 30X36 HEAVY ABSORB (UNDERPADS AND DIAPERS) ×2 IMPLANT
YANKAUER SUCT BULB TIP NO VENT (SUCTIONS) IMPLANT

## 2021-06-20 NOTE — Transfer of Care (Signed)
Immediate Anesthesia Transfer of Care Note  Patient: Stephanie Sanchez  Procedure(s) Performed: Excision of left foot ganglion cyst (Left: Foot)  Patient Location: PACU  Anesthesia Type:MAC combined with regional for post-op pain  Level of Consciousness: awake, alert , oriented and patient cooperative  Airway & Oxygen Therapy: Patient Spontanous Breathing  Post-op Assessment: Report given to RN and Post -op Vital signs reviewed and stable  Post vital signs: Reviewed and stable  Last Vitals:  Vitals Value Taken Time  BP    Temp    Pulse 80 06/20/21 1420  Resp 19 06/20/21 1420  SpO2 99 % 06/20/21 1420  Vitals shown include unvalidated device data.  Last Pain:  Vitals:   06/20/21 1208  TempSrc: Oral  PainSc: 0-No pain      Patients Stated Pain Goal: 3 (35/78/97 [id number])  Complications: No notable events documented.

## 2021-06-20 NOTE — Progress Notes (Signed)
Per Dr. Sabra Heck okay to cancel redraw on labs

## 2021-06-20 NOTE — Progress Notes (Signed)
Assisted Dr. Miller with left, ultrasound guided, popliteal block. Side rails up, monitors on throughout procedure. See vital signs in flow sheet. Tolerated Procedure well. 

## 2021-06-20 NOTE — Anesthesia Procedure Notes (Signed)
Anesthesia Regional Block: Popliteal block   Pre-Anesthetic Checklist: , timeout performed,  Correct Patient, Correct Site, Correct Laterality,  Correct Procedure, Correct Position, site marked,  Risks and benefits discussed,  Surgical consent,  Pre-op evaluation,  At surgeon's request and post-op pain management  Laterality: Left  Prep: chloraprep       Needles:  Injection technique: Single-shot  Needle Type: Stimiplex     Needle Length: 9cm  Needle Gauge: 21     Additional Needles:   Procedures:,,,, ultrasound used (permanent image in chart),,    Narrative:  Start time: 06/20/2021 1:04 PM End time: 06/20/2021 1:09 PM Injection made incrementally with aspirations every 5 mL.  Performed by: Personally  Anesthesiologist: Lynda Rainwater, MD

## 2021-06-20 NOTE — H&P (Signed)
Stephanie Sanchez is an 66 y.o. female.   Chief Complaint: Left foot pain HPI: 66 year old female without significant past medical history presents today for surgical treatment of chronic left foot pain.  She has a prominent ganglion cyst on the dorsum of the left foot that has been getting larger over the last few months.  She has failed nonoperative treatment to date including oral anti-inflammatories and shoewear modification.  Past Medical History:  Diagnosis Date   Arthritis    Coronary artery disease involving native coronary artery    cardiologist--- dr h. 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[place])    first dx 2013;  s/p  WLE and Vulvectomy's   PONV (postoperative nausea and vomiting)    Psoriasis    Scoliosis    cervicothoracic region    Past Surgical History:  Procedure Laterality Date   BLADDER SUSPENSION  2008   sling   BREAST EXCISIONAL BIOPSY Left    BUNIONECTOMY  2008   CARDIOVASCULAR STRESS TEST  09-30-2013   dr Marlou Porch   normal nuclear study/  no ischemia/  normal LV function and wall motion,  ef 61%   CATARACT EXTRACTION W/ INTRAOCULAR LENS  IMPLANT, BILATERAL  2016   COLONOSCOPY WITH ESOPHAGOGASTRODUODENOSCOPY (EGD)  04-30-2020  [facility] in [place]   HAND SURGERY Right 11/02/12   hand reconstruction at [place] EXCISIONAL BREAST BIOPSY Right 11/09/2018   Procedure: RADIOACTIVE SEED GUIDED EXCISIONAL RIGHT BREAST BIOPSY AND RIGHT AREOLA BIOPSY;  Surgeon: Rolm Bookbinder, MD;  Location: [place];  Service: General;  Laterality: Right;   TONSILLECTOMY  1973  age 76   [place]  age 37   VULVA [place] BIOPSY N/A 08/29/2014   Procedure: Georgena Spurling;  Surgeon: Everitt Amber, MD;  Location: [place];  Service: Gynecology;  Laterality: N/A;   VULVECTOMY N/A 08/29/2014   Procedure: WIDE LOCAL EXCISION OF VULVA;  Surgeon: Everitt Amber, MD;  Location: [place];  Service: Gynecology;  Laterality: N/A;   VULVECTOMY N/A 05/04/2015   Procedure: WIDE LOCAL EXCISION LEFT  VULVAR WITH BIOPSY OF RIGHT VULVA;  Surgeon: Everitt Amber, MD;  Location: [place];  Service: Gynecology;  Laterality: N/A;   VULVECTOMY Left 01/22/2016   Procedure: PARTIAL SIMPLE VULVECTOMY;  Surgeon: Everitt Amber, MD;  Location: [facility];  Service: Gynecology;  Laterality: Left;   VULVECTOMY N/A 02/14/2016   Procedure: WIDE EXCISION VULVECTOMY;  Surgeon: Everitt Amber, MD;  Location: [facility];  Service: Gynecology;  Laterality: N/A;   VULVECTOMY N/A 05/09/2020   Procedure: WIDE LOCAL EXCISION VULVECTOMY;  Surgeon: Everitt Amber, MD;  Location: [facility];  Service: Gynecology;  Laterality: N/A;    Family History  Problem Relation Age of Onset   Aortic aneurysm Mother    AAA (abdominal aortic aneurysm) Mother    Diabetes Father  Other Father        enlarged heart   Arrhythmia Brother    Heart attack Maternal Grandfather    Arrhythmia Brother    Thyroid cancer Daughter 4   Breast cancer Cousin    Social History:  reports that she quit smoking about 42 years ago. Her smoking use included cigarettes. She has a 5.00 pack-year smoking history. She has never used smokeless tobacco. She reports current alcohol use. She reports that she does not use drugs.  Allergies:  Allergies  Allergen Reactions   Codeine Itching, Nausea And Vomiting, Rash and Nausea Only    "comes out like a burn/rash" "comes  out like a burn/rash"    Medications Prior to Admission  Medication Sig Dispense Refill   b complex vitamins capsule Take 1 capsule by mouth daily.     Calcium Carbonate (CALCIUM 500 PO) Take by mouth in the morning and at bedtime.     Cholecalciferol (VITAMIN D) 2000 UNITS tablet Take 2,000 Units by mouth daily.     Coenzyme Q10 (COQ-10) 100 MG CAPS Take by mouth at bedtime.     Cyanocobalamin (VITAMIN B-12 PO) Take 1 tablet by mouth daily.     estradiol (ESTRACE VAGINAL) 0.1 MG/GM vaginal cream Place 1 Applicatorful vaginally at bedtime. 42.5 g 12   estradiol (VIVELLE-DOT) 0.05 MG/24HR patch Place 1 patch onto the skin 2 (two) times a week.     magnesium gluconate (MAGONATE) 500 MG tablet Take 500 mg by mouth as needed (cramps).     omega-3 acid ethyl esters (LOVAZA) 1 g capsule Take 1 g by mouth daily.     OVER THE COUNTER MEDICATION Take 1,000 mg by mouth daily. Ester C 1000mg      Probiotic Product (PROBIOTIC PO) Take by mouth.     rosuvastatin (CRESTOR) 10 MG tablet Take 1 tablet (10 mg total) by mouth daily. (Patient taking differently: Take 10 mg by mouth at bedtime.) 90 tablet 3   Specialty Vitamins Products (COLLAGEN ULTRA) CAPS Take by mouth daily.     telmisartan (MICARDIS) 40 MG tablet Take 40 mg by mouth daily.     tretinoin (RETIN-A) 0.1 % cream Apply topically at bedtime.     TURMERIC CURCUMIN PO Take 1,600 mg by mouth 3 (three) times daily.     Ubiquinol 100 MG CAPS Take by mouth 1 day or 1 dose.     zinc gluconate 50 MG tablet Take 50 mg by mouth daily.     Garlic 0093 MG CAPS Take 2,000 mg by mouth 3 (three) times daily.      No results found for this or any previous visit (from the past 48 hour(s)). No results found.  Review of Systems no recent fever, chills, nausea, vomiting or changes in her appetite  Blood pressure 119/71, pulse 75, temperature 97.7 F (36.5 C), temperature source Oral, resp. rate 20, height 5\' 2"  (1.575 m), weight 52.4 kg, SpO2 100 %. Physical  Exam  Well-nourished well-developed woman in no apparent distress.  Alert and oriented x4.  Normal mood and affect.  Normal gait.  Left foot has a prominent subcutaneous mass over the first TMT joint.  It transilluminates and is somewhat mobile and firm to palpation.  Pulses are palpable in the foot.  No lymphadenopathy.  Intact sensibility to light touch dorsally and plantarly at the forefoot.  5 out of 5 strength in plantarflexion and dorsiflexion of the ankle and toes.  The mass seems to involve the EHL tendon.  Assessment/Plan  Painful left dorsal foot mass-this most likely represents a ganglion cyst involving the EHL tendon sheath .  She has failed nonoperative treatment to date and presents today for surgical excision.  The risks and benefits of the alternative treatment options have been discussed in detail.  The patient wishes to proceed with surgery and specifically understands risks of bleeding, infection, nerve damage, blood clots, need for additional surgery, amputation and death.  Wylene Simmer, MD Jul 18, 2021, 12:31 PM

## 2021-06-20 NOTE — Discharge Instructions (Addendum)
Wylene Simmer, MD [facility]  Please read the following information regarding your care after surgery.  Medications  You only need a prescription for the narcotic pain medicine (ex. oxycodone, Percocet, Norco).  All of the other medicines listed below are available over the counter. X Aleve 2 pills twice a day for the first 3 days after surgery. X acetominophen (Tylenol) 650 mg every 4-6 hours as you need for minor to moderate pain X tramadol as prescribed for severe pain  Weight Bearing X Bear weight on your operated foot in the post-op shoe.  Cast / Splint / Dressing X Keep your splint, cast or dressing clean and dry.  Dont put anything (coat hanger, pencil, etc) down inside of it.  If it gets damp, use a hair dryer on the cool setting to dry it.  If it gets soaked, call the office to schedule an appointment for a cast change.  After your dressing, cast or splint is removed; you may shower, but do not soak or scrub the wound.  Allow the water to run over it, and then gently pat it dry.  Swelling It is normal for you to have swelling where you had surgery.  To reduce swelling and pain, keep your toes above your nose for at least 3 days after surgery.  It may be necessary to keep your foot or leg elevated for several weeks.  If it hurts, it should be elevated.  Follow Up Call my office at [phone] when you are discharged from the hospital or surgery center to schedule an appointment to be seen two weeks after surgery.  Call my office at [phone] if you develop a fever >101.5 F, nausea, vomiting, bleeding from the surgical site or severe pain.      Post Anesthesia Home Care Instructions  Activity: Get plenty of rest for the remainder of the day. A responsible individual must stay with you for 24 hours following the procedure.  For the next 24 hours, DO NOT: -Drive a car -Paediatric nurse -Drink alcoholic beverages -Take any medication unless instructed by your  physician -Make any legal decisions or sign important papers.  Meals: Start with liquid foods such as gelatin or soup. Progress to regular foods as tolerated. Avoid greasy, spicy, heavy foods. If nausea and/or vomiting occur, drink only clear liquids until the nausea and/or vomiting subsides. Call your physician if vomiting continues.  Special Instructions/Symptoms: Your throat may feel dry or sore from the anesthesia or the breathing tube placed in your throat during surgery. If this causes discomfort, gargle with warm salt water. The discomfort should disappear within 24 hours.  If you had a scopolamine patch placed behind your ear for the management of post- operative nausea and/or vomiting:  1. The medication in the patch is effective for 72 hours, after which it should be removed.  Wrap patch in a tissue and discard in the trash. Wash hands thoroughly with soap and water. 2. You may remove the patch earlier than 72 hours if you experience unpleasant side effects which may include dry mouth, dizziness or visual disturbances. 3. Avoid touching the patch. Wash your hands with soap and water after contact with the patch.     Regional Anesthesia Blocks  1. Numbness or the inability to move the "blocked" extremity may last from 3-48 hours after placement. The length of time depends on the medication injected and your individual response to the medication. If the numbness is not going away after 48 hours, call your surgeon.  2. The extremity that is blocked will need to be protected until the numbness is gone and the  Strength has returned. Because you cannot feel it, you will need to take extra care to avoid injury. Because it may be weak, you may have difficulty moving it or using it. You may not know what position it is in without looking at it while the block is in effect.  3. For blocks in the legs and feet, returning to weight bearing and walking needs to be done carefully. You will need to  wait until the numbness is entirely gone and the strength has returned. You should be able to move your leg and foot normally before you try and bear weight or walk. You will need someone to be with you when you first try to ensure you do not fall and possibly risk injury.  4. Bruising and tenderness at the needle site are common side effects and will resolve in a few days.  5. Persistent numbness or new problems with movement should be communicated to the surgeon or the [facility] [phone] [place] [phone]).

## 2021-06-20 NOTE — Op Note (Signed)
06/20/2021  2:19 PM  PATIENT:  Stephanie Sanchez  66 y.o. female  PRE-OPERATIVE DIAGNOSIS:  left foot ganglion cyst  POST-OPERATIVE DIAGNOSIS:   1.  left foot ganglion cyst      2.  Left extensor hallucis longus tenosynovitis  Procedure(s):  1.  Excision of left foot ganglion cyst 2.  Tenolysis of the left extensor hallucis longus  SURGEON:  Wylene Simmer, MD  ASSISTANT: none  ANESTHESIA:   MAC, regional  EBL:  minimal   TOURNIQUET:   Total Tourniquet Time Documented: Calf (Left) - 15 minutes Total: Calf (Left) - 15 minutes  COMPLICATIONS:  None apparent  DISPOSITION:  Extubated, awake and stable to recovery.  INDICATION FOR PROCEDURE: The patient is a 66 year old female without significant past medical history.  She has an enlarging mass at the dorsum of her left foot which has become painful.  Signs and symptoms are consistent with a ganglion cyst.  She presents now for surgical treatment.  The risks and benefits of the alternative treatment options have been discussed in detail.  The patient wishes to proceed with surgery and specifically understands risks of bleeding, infection, nerve damage, blood clots, need for additional surgery, amputation and death.   PROCEDURE IN DETAIL:  After pre operative consent was obtained, and the correct operative site was identified, the patient was brought to the operating room and placed supine on the OR table.  Anesthesia was administered.  Pre-operative antibiotics were administered.  A surgical timeout was taken.  The left lower extremity was prepped and draped in standard sterile fashion.  The foot was exsanguinated and a 4 inch Esmarch tourniquet wrapped around the ankle.  A longitudinal incision was made over the dorsal mass adjacent to the first TMT joint.  Dissection was carried sharply down through the subcutaneous tissues.  The mass was well-circumscribed and was translucent with clear fluid.  Careful blunt dissection was carried  circumferentially around the mass.  Care was taken to protect the adjacent branches of the superficial peroneal nerve as well as the dorsalis pedis artery and deep peroneal nerve.  The mass was noted to involve the tenosynovium of the extensor hallucis longus tendon.  Mass was excised in its entirety and passed off the field as a specimen to pathology.  Careful tenolysis was then carried along the EHL tendon removing all residual tenosynovitis.  This was done with tenotomy scissors.  The wound was irrigated copiously and sprinkled with vancomycin powder.  Subcutaneous tissues were approximated with Monocryl.  Skin incision was closed with nylon.  Sterile dressings were applied followed by compression wrap.  Tourniquet was released after application of the dressings.  The patient was awakened from anesthesia and transported to the recovery room in stable condition.   FOLLOW UP PLAN: Weightbearing as tolerated in a flat postop shoe.  Follow-up in the office in 2 weeks for suture removal and pathology results.

## 2021-06-20 NOTE — Anesthesia Postprocedure Evaluation (Signed)
Anesthesia Post Note  Patient: Stephanie Sanchez  Procedure(s) Performed: Excision of left foot ganglion cyst (Left: Foot)     Patient location during evaluation: PACU Anesthesia Type: MAC Level of consciousness: awake and alert Pain management: pain level controlled Vital Signs Assessment: post-procedure vital signs reviewed and stable Respiratory status: spontaneous breathing, nonlabored ventilation and respiratory function stable Cardiovascular status: blood pressure returned to baseline and stable Postop Assessment: no apparent nausea or vomiting Anesthetic complications: no   No notable events documented.  Last Vitals:  Vitals:   06/20/21 1430 06/20/21 1520  BP: 112/68 116/66  Pulse: 69 75  Resp: 14 16  Temp:  (!) 36.4 C  SpO2: 96% 96%    Last Pain:  Vitals:   06/20/21 1520  TempSrc:   PainSc: 0-No pain                 Lynda Rainwater

## 2021-06-20 NOTE — Anesthesia Preprocedure Evaluation (Signed)
Anesthesia Evaluation  Patient identified by MRN, date of birth, ID band Patient awake    Reviewed: Allergy & Precautions, NPO status , Patient's Chart, lab work & pertinent test results  History of Anesthesia Complications (+) PONV and history of anesthetic complications  Airway Mallampati: II  TM Distance: >3 FB Neck ROM: Full    Dental no notable dental hx.    Pulmonary former smoker,    Pulmonary exam normal        Cardiovascular hypertension, + CAD  Normal cardiovascular exam     Neuro/Psych negative neurological ROS  negative psych ROS   GI/Hepatic Neg liver ROS, GERD  Controlled,  Endo/Other  negative endocrine ROS  Renal/GU negative Renal ROS  negative genitourinary   Musculoskeletal  (+) Arthritis , Osteoarthritis,    Abdominal   Peds  Hematology negative hematology ROS (+)   Anesthesia Other Findings Day of surgery medications reviewed with patient.  Reproductive/Obstetrics Paget's disease of vulva                             Anesthesia Physical  Anesthesia Plan  ASA: II  Anesthesia Plan: MAC   Post-op Pain Management: Regional block* and Minimal or no pain anticipated   Induction: Intravenous  PONV Risk Score and Plan: 3 and Treatment may vary due to age or medical condition, Ondansetron, Midazolam and Propofol infusion  Airway Management Planned: Simple Face Mask  Additional Equipment: None  Intra-op Plan:   Post-operative Plan:   Informed Consent: I have reviewed the patients History and Physical, chart, labs and discussed the procedure including the risks, benefits and alternatives for the proposed anesthesia with the patient or authorized representative who has indicated his/her understanding and acceptance.     Dental advisory given  Plan Discussed with: CRNA  Anesthesia Plan Comments:         Anesthesia Quick Evaluation

## 2021-06-21 ENCOUNTER — Encounter (HOSPITAL_BASED_OUTPATIENT_CLINIC_OR_DEPARTMENT_OTHER): Payer: Self-pay | Admitting: Orthopedic Surgery

## 2021-06-21 LAB — SURGICAL PATHOLOGY

## 2021-06-28 ENCOUNTER — Encounter: Payer: Medicare Other | Admitting: Surgical

## 2021-09-10 ENCOUNTER — Telehealth: Payer: Self-pay | Admitting: Plastic Surgery

## 2021-09-10 NOTE — Telephone Encounter (Signed)
Left message to return call for scheduling follow up appointment at [facility] location to move forward in process. Recommended [facility] location for sooner appointment.  ?

## 2021-09-13 ENCOUNTER — Ambulatory Visit: Payer: Medicare Other | Admitting: Student

## 2021-09-17 ENCOUNTER — Ambulatory Visit (INDEPENDENT_AMBULATORY_CARE_PROVIDER_SITE_OTHER): Payer: Medicare Other | Admitting: Student

## 2021-09-17 DIAGNOSIS — R21 Rash and other nonspecific skin eruption: Secondary | ICD-10-CM | POA: Diagnosis not present

## 2021-09-17 DIAGNOSIS — L989 Disorder of the skin and subcutaneous tissue, unspecified: Secondary | ICD-10-CM

## 2021-09-17 DIAGNOSIS — M549 Dorsalgia, unspecified: Secondary | ICD-10-CM

## 2021-09-17 DIAGNOSIS — N62 Hypertrophy of breast: Secondary | ICD-10-CM | POA: Diagnosis not present

## 2021-09-17 DIAGNOSIS — M542 Cervicalgia: Secondary | ICD-10-CM

## 2021-09-17 NOTE — Progress Notes (Signed)
? ?  Referring Provider ?Crist Infante, MD ?[address] ?[place],  [place] [address]  ? ?CC:  ?Chief Complaint  ?Patient presents with  ? Follow-up  ?   ? ?Stephanie Sanchez is an 66 y.o. female.  ?HPI: Patient is a 66 y.o. year old female here for follow up after she was supposed to undergo breast reduction on 06/05/2021 but surgery was canceled due to vulvectomy she underwent in January due to her extramammary Paget's disease and left foot ganglion excision she underwent in February. ? ?She was seen for initial consult by Dr. Janalyn Shy on 02/19/21.  At that time, patient reported symptoms including upper and lower back pain, neck pain, and strap grooving.  She also reported exercising and running more inhibited by her enlarged breasts.   ? ?Today, the patient presents wanting to pursue breast reduction surgery.  She states that she has been cleared from her OB/GYN and her orthopedic doctors.  Patient states that she is still experiencing back and neck pain due to her enlarged breasts.  She also states that she gets rashes and yeast infections underneath her breasts.  She states she uses an antifungal cream prescribed by her PCP for her inframammary rashes.  Patient reports she has gone to physical therapy multiple times which helps a little bit but she still experiences symptoms for the most part.  Patient states she would like to move forward with surgery. ? ?Patient also mentioned that she was supposed to have a lesion on her face removed but this procedure also was delayed due to the above-mentioned surgeries. She states the lesion has gotten larger since she was last seen in the clinic.  Patient also states she would like to move forward with this excision. ? ?Review of Systems ?MSK: Endorses ongoing back and neck discomfort ?Skin: Reports inframammary rashes, growing skin lesion on R side of face ? ?Physical Exam ? ?  06/20/2021  ?  3:20 PM 06/20/2021  ?  2:30 PM 06/20/2021  ?  2:23 PM  ?Vitals with BMI  ?Systolic  [phone]  ?Diastolic 66 68 63  ?Pulse 75 69 75  ?  ?General:  No acute distress,  Alert and oriented, Non-Toxic, Normal speech and affect ?Psych: Normal behavior and mood ?Respiratory: No increased WOB ?MSK: Ambulatory ?Skin: 2 cm tan scaly patch on near her R temporal area ? ?Assessment/Plan ? ?Patient is interested in pursuing surgical intervention for bilateral breast reduction and excision of skin lesion.  ? ?Patient's last mammogram was completed on 04/18/2021. There was no mammographic evidence of malignancy. ? ?Patient requesting procedures be completed after her vacation in July.  We will work with the office and surgical scheduling staff in regards to this. ? ?Discussed with patient we would submit to insurance for authorization for bilateral breast reduction, discussed approval could take up to a few weeks. ? ?Encouraged patient to focus on high-protein, low-carb diet. ? ?I spoke with Dr. Marla Roe regarding the plan.  Per Dr. Marla Roe, skin lesion excision can take place in the office. ? ?Picture of skin lesion obtained and placed in chart with patient's permission ? ?Clance Boll ?09/17/2021, 1:56 PM  ? ? ? ? ?  ? ? ?  ?

## 2021-10-22 ENCOUNTER — Ambulatory Visit (HOSPITAL_BASED_OUTPATIENT_CLINIC_OR_DEPARTMENT_OTHER): Payer: Medicare Other | Attending: Internal Medicine | Admitting: Physical Therapy

## 2021-10-22 ENCOUNTER — Encounter (HOSPITAL_BASED_OUTPATIENT_CLINIC_OR_DEPARTMENT_OTHER): Payer: Self-pay | Admitting: Physical Therapy

## 2021-10-22 DIAGNOSIS — G8929 Other chronic pain: Secondary | ICD-10-CM | POA: Insufficient documentation

## 2021-10-22 DIAGNOSIS — R293 Abnormal posture: Secondary | ICD-10-CM | POA: Insufficient documentation

## 2021-10-22 DIAGNOSIS — R262 Difficulty in walking, not elsewhere classified: Secondary | ICD-10-CM | POA: Diagnosis present

## 2021-10-22 DIAGNOSIS — M25561 Pain in right knee: Secondary | ICD-10-CM | POA: Diagnosis present

## 2021-10-22 NOTE — Therapy (Signed)
OUTPATIENT PHYSICAL THERAPY SHOULDER EVALUATION   Patient Name: Stephanie Sanchez MRN: [id number] DOB:[birth year], 66 y.o., female Today's Date: 10/22/2021   PT End of Session - 10/22/21 1527     Visit Number 1    Number of Visits 15    Date for PT Re-Evaluation 01/17/22    Authorization Type MCR A & B    PT Start Time 4098    PT Stop Time 1600    PT Time Calculation (min) 45 min    Activity Tolerance Patient tolerated treatment well    Behavior During Therapy [facility] for tasks assessed/performed             Past Medical History:  Diagnosis Date   Arthritis    Coronary artery disease involving native coronary artery    cardiologist--- dr h. 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[place])    first dx 2013;  s/p  WLE and Vulvectomy's   PONV (postoperative nausea and vomiting)    Psoriasis    Scoliosis    cervicothoracic region   Past Surgical History:  Procedure Laterality Date   BLADDER SUSPENSION  2008   sling   BREAST EXCISIONAL BIOPSY Left    BUNIONECTOMY  2008   CARDIOVASCULAR STRESS TEST  09-30-2013   dr Marlou Porch   normal nuclear study/  no ischemia/  normal LV function and wall motion,  ef 61%   CATARACT EXTRACTION W/ INTRAOCULAR LENS  IMPLANT, BILATERAL  2016   COLONOSCOPY WITH ESOPHAGOGASTRODUODENOSCOPY (EGD)  04-30-2020  [facility] [place]   GANGLION CYST EXCISION Left 06/20/2021   Procedure: Excision of left foot ganglion cyst;  Surgeon: Wylene Simmer, MD;  Location: [place];  Service: Orthopedics;   Laterality: Left;  52mn   HAND SURGERY Right 11/02/12   [place] 11/09/2018   Procedure: RADIOACTIVE SEED GUIDED EXCISIONAL RIGHT BREAST BIOPSY AND RIGHT AREOLA BIOPSY;  Surgeon: WRolm Bookbinder MD;  [place]  Service: General;  Laterality: Right;   TONSILLECTOMY  1973  age 66  VAGINAL HYSTERECTOMY  1988  age 66  [place]UL[place] 08/[place]/2014   Procedure: VGeorgena Spurling  Surgeon: EEveritt Amber MD;  [facility]  Service: Gynecology;  Laterality: N/A;   VULVECTOMY N/A 08/29/2014   Procedure: WIDE LOCAL EXCISION OF VULVA;  Surgeon: EEveritt Amber MD;  [place]  Service: Gynecology;  Laterality: N/A;   VULVECTOMY N/A 05/04/2015   Procedure: WIDE LOCAL EXCISION LEFT  VULVAR WITH BIOPSY OF RIGHT VULVA;  Surgeon: EEveritt Amber MD;  [place]  Service: Gynecology;  Laterality: N/A;   VULVECTOMY Left 01/22/2016   Procedure: PARTIAL SIMPLE VULVECTOMY;  Surgeon: EEveritt Amber MD;  [facility]  Service: Gynecology;  Laterality: Left;   VULVECTOMY N/A 02/14/2016   Procedure: WIDE EXCISION VULVECTOMY;  Surgeon: Everitt Amber, MD;  Location: [facility];  Service: Gynecology;  Laterality: N/A;   VULVECTOMY N/A 05/09/2020   Procedure: WIDE LOCAL EXCISION VULVECTOMY;  Surgeon: Everitt Amber, MD;  Location: [facility];  Service: Gynecology;  Laterality: N/A;   Patient Active Problem List   Diagnosis Date Noted   Urge incontinence 06/11/2020   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[place]) 05/09/2020   Encounter for counseling 03/18/2019   Symptomatic mammary hypertrophy 03/18/2019   Neck pain 03/18/2019   Back pain 03/18/2019   Vulval cellulitis 41/74/0814   Lichen simplex chronicus 09/26/2014   Chest pain 10/21/2013   Dyspnea 10/21/2013   GERD (gastroesophageal reflux disease)  10/21/2013   Idiopathic scoliosis 10/21/2013   Personal history of colonic polyps 10/21/2013   Genital atrophy of female 07/02/2012    PCP: Crist Infante, MD  REFERRING PROVIDER: Crist Infante, MD  REFERRING DIAG: M41.9 (ICD-10-CM) - Scoliosis, unspecified  THERAPY DIAG:  Abnormal posture  Rationale for Evaluation and Treatment Rehabilitation  ONSET DATE: chronic  SUBJECTIVE:                                                                                                                                                                                      SUBJECTIVE STATEMENT: I feel like the back has shifted back. Last time I felt like I was straight.   PERTINENT HISTORY: Chronic scoliosis  PAIN:  Are you having pain? no  PRECAUTIONS: None  WEIGHT BEARING RESTRICTIONS No  FALLS:  Has patient fallen in last 6 months? No  LIVING ENVIRONMENT: Lives with: lives with their family  OCCUPATION: retired  PLOF: Independent  PATIENT GOALS aquatic & machine exercises  OBJECTIVE:    PATIENT SURVEYS:  FOTO 54  COGNITION:  Overall cognitive status: Within functional limits for tasks assessed     SENSATION: WFL  POSTURE: Lumbar levoscoliosis, thoraco dextroscoliosis, Rt hip elevation    MMT:  lb via hand held dynamometry  MMT Right eval Left eval  Shoulder flexion 19.7 22.4  Shoulder abduction 24.3 17.7  Shoulder extension 13.2 11.2  Shoulder internal rotation 12.5 11.1  Shoulder external rotation 12.3 13.5  Hip abduction- sidelying 21.7 22.5  (Blank rows = not tested)     TODAY'S TREATMENT:  EVAL Lt SL knee toward knee PRI Cable machine: Lt UE adduction 5lb, seated row 12lb, lat pull down 25lb    PATIENT EDUCATION: Education details: Geophysicist/field seismologist of condition, POC, HEP, exercise form/rationale  Person educated: Patient Education method: Explanation, Demonstration, Tactile cues, Verbal cues, and Handouts Education comprehension: verbalized  understanding, returned demonstration, verbal cues required, tactile cues required, and needs further education   HOME  EXERCISE PROGRAM: PRI 90-90 hip lift with Rt arm reach, Lt sidelying knee toward knee, Standing unsupported right lift with right trunk rotation [id number]  6/20: This HEP was provided last POC and will be built upon in this episode.   ASSESSMENT:  CLINICAL IMPRESSION: Patient is a 66 y.o. F who was seen today for physical therapy evaluation and treatment for back and hip pain due to scoliosis. I have seen her in the past and she was successful in stabilization of spine with decrease in pain. Some return of pain and will benefit from training for stabilization and long term exercise program.     OBJECTIVE IMPAIRMENTS decreased activity tolerance, decreased strength, increased muscle spasms, improper body mechanics, postural dysfunction, and pain.   ACTIVITY LIMITATIONS carrying, lifting, bending, squatting, sleeping, and stairs  PARTICIPATION LIMITATIONS: meal prep, cleaning, driving, community activity, and yard work  PERSONAL FACTORS  chronic scoliosis  are also affecting patient's functional outcome.   REHAB POTENTIAL: Good  CLINICAL DECISION MAKING: Stable/uncomplicated  EVALUATION COMPLEXITY: Low   GOALS: Goals reviewed with patient? Yes  SHORT TERM GOALS: Target date: 11/12/2021    Able to demo sidelying knee toward knee exercise without cuing Baseline: Goal status: INITIAL   LONG TERM GOALS: Target date: 01/17/22  Hip abduction strength via dynamometry to meet age norm values Baseline: 38lb is norm value Goal status: INITIAL  2.  FOTO to meet stated goal Baseline:  Goal status: INITIAL  3.  Able to demo comfortable posture with less hip hike per pt subjective report Baseline:  Goal status: INITIAL  4.  Pt will be independent in long term land & aquatic HEP for continued strengthening Baseline:  Goal status: INITIAL     PLAN: PT  FREQUENCY: 1x/week  PT DURATION: 12 weeks  PLANNED INTERVENTIONS: Therapeutic exercises, Therapeutic activity, Neuromuscular re-education, Balance training, Gait training, Patient/Family education, Joint mobilization, Stair training, Aquatic Therapy, Dry Needling, Electrical stimulation, Spinal mobilization, Cryotherapy, Moist heat, Taping, Manual therapy, and Re-evaluation  PLAN FOR NEXT SESSION: begin aquatics- focusing on Rt hip abd & Lt oblique activaiton  Kerby Hockley C. Alek Borges PT, DPT 10/22/21 4:26 PM

## 2021-10-29 ENCOUNTER — Ambulatory Visit (HOSPITAL_BASED_OUTPATIENT_CLINIC_OR_DEPARTMENT_OTHER): Payer: Medicare Other | Admitting: Physical Therapy

## 2021-10-29 ENCOUNTER — Encounter (HOSPITAL_BASED_OUTPATIENT_CLINIC_OR_DEPARTMENT_OTHER): Payer: Self-pay | Admitting: Physical Therapy

## 2021-10-29 DIAGNOSIS — G8929 Other chronic pain: Secondary | ICD-10-CM

## 2021-10-29 DIAGNOSIS — R293 Abnormal posture: Secondary | ICD-10-CM | POA: Diagnosis not present

## 2021-10-29 DIAGNOSIS — R262 Difficulty in walking, not elsewhere classified: Secondary | ICD-10-CM

## 2021-11-06 ENCOUNTER — Encounter (HOSPITAL_BASED_OUTPATIENT_CLINIC_OR_DEPARTMENT_OTHER): Payer: Self-pay | Admitting: Physical Therapy

## 2021-11-06 ENCOUNTER — Ambulatory Visit (HOSPITAL_BASED_OUTPATIENT_CLINIC_OR_DEPARTMENT_OTHER): Payer: Medicare Other | Attending: Internal Medicine | Admitting: Physical Therapy

## 2021-11-06 ENCOUNTER — Encounter: Payer: Self-pay | Admitting: Surgical

## 2021-11-06 DIAGNOSIS — R293 Abnormal posture: Secondary | ICD-10-CM | POA: Insufficient documentation

## 2021-11-06 DIAGNOSIS — R262 Difficulty in walking, not elsewhere classified: Secondary | ICD-10-CM | POA: Insufficient documentation

## 2021-11-06 DIAGNOSIS — G8929 Other chronic pain: Secondary | ICD-10-CM | POA: Diagnosis present

## 2021-11-06 DIAGNOSIS — M25561 Pain in right knee: Secondary | ICD-10-CM | POA: Diagnosis present

## 2021-11-06 NOTE — Progress Notes (Signed)
Patient ID: Stephanie Sanchez, female    DOB: 28-Feb-1956, 66 y.o.   MRN: [id number]  Chief Complaint  Patient presents with   Pre-op Exam      ICD-10-CM   1. Symptomatic mammary hypertrophy  N62     2. Neck pain  M54.2        History of Present Illness: ADRINE HAYWORTH is a 66 y.o.  female  with a history of macromastia.  She presents for preoperative evaluation for upcoming procedure, Bilateral Breast Reduction with possible liposuction, scheduled for 12/05/2021 with Dr.  Marla Roe  The patient has had problems with anesthesia.  Patient states that in the past she has had postoperative nausea and vomiting.  Patient denies any personal history of breast cancer, although she does report she had a lumpectomy several years ago.  She reports she has a cousin with breast cancer.  She denies any cardiac disease.  She denies being on any blood thinners.  She denies being a smoker.  Patient reports she is on an estrogen patch 2 times per week.  Patient states she does not know the exact reason uses the patch.  Patient denies history of miscarriage.  She denies any history of blood clots or family history of blood clots.  She denies any recent traumas, surgeries, infections, strokes or heart attacks.  She denies history of Crohn's or ulcerative colitis.  She denies any COPD or asthma.  She does report history of extramammary Paget's disease.  Patient states she is currently at 35 DDD cup.  She states that she would like to be around a B cup.  Summary of Previous Visit: Patient was previously scheduled for breast reduction surgery in February 2023, however this was canceled due to a vulvectomy and left foot ganglion cyst excision in February.  STN on the right is 29 cm and the left is 28 cm.  Preoperative bra size equals double D/triple D cup.  Estimated excess breast tissue to be removed at time of surgery: 290 grams  Job: Retired  [place] Significant for: Macromastia, GERD, extramammary  Paget's disease   Past Medical History: Allergies: Allergies  Allergen Reactions   Codeine Itching, Nausea And Vomiting, Rash and Nausea Only    "comes out like a burn/rash" "comes out like a burn/rash"    Current Medications:  Current Outpatient Medications:    b complex vitamins capsule, Take 1 capsule by mouth daily., Disp: , Rfl:    Calcium Carbonate (CALCIUM 500 PO), Take by mouth in the morning and at bedtime., Disp: , Rfl:    Cholecalciferol (VITAMIN D) 2000 UNITS tablet, Take 2,000 Units by mouth daily., Disp: , Rfl:    Coenzyme Q10 (COQ-10) 100 MG CAPS, Take by mouth at bedtime., Disp: , Rfl:    Cyanocobalamin (VITAMIN B-12 PO), Take 1 tablet by mouth daily., Disp: , Rfl:    estradiol (ESTRACE VAGINAL) 0.1 MG/GM vaginal cream, Place 1 Applicatorful vaginally at bedtime., Disp: 42.5 g, Rfl: 12   estradiol (VIVELLE-DOT) 0.05 MG/24HR patch, Place 1 patch onto the skin 2 (two) times a week., Disp: , Rfl:    magnesium gluconate (MAGONATE) 500 MG tablet, Take 500 mg by mouth as needed (cramps)., Disp: , Rfl:    omega-3 acid ethyl esters (LOVAZA) 1 g capsule, Take 1 g by mouth daily., Disp: , Rfl:    OVER THE COUNTER MEDICATION, Take 1,000 mg by mouth daily. Ester C '1000mg'$ , Disp: , Rfl:    Probiotic Product (PROBIOTIC PO),  Take by mouth., Disp: , Rfl:    rosuvastatin (CRESTOR) 10 MG tablet, Take 1 tablet (10 mg total) by mouth daily. (Patient taking differently: Take 10 mg by mouth at bedtime.), Disp: 90 tablet, Rfl: 3   Specialty Vitamins Products (COLLAGEN ULTRA) CAPS, Take by mouth daily., Disp: , Rfl:    telmisartan (MICARDIS) 40 MG tablet, Take 40 mg by mouth daily., Disp: , Rfl:    tretinoin (RETIN-A) 0.1 % cream, Apply topically at bedtime., Disp: , Rfl:    TURMERIC CURCUMIN PO, Take 1,600 mg by mouth 3 (three) times daily., Disp: , Rfl:    Ubiquinol 100 MG CAPS, Take by mouth 1 day or 1 dose., Disp: , Rfl:    zinc gluconate 50 MG tablet, Take 50 mg by mouth daily., Disp: ,  Rfl:   Past Medical Problems: Past Medical History:  Diagnosis Date   Arthritis    Coronary artery disease involving native coronary artery    cardiologist--- dr h. 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[place])    first dx 2013;  s/p  WLE and Vulvectomy's   PONV (postoperative nausea and vomiting)    Psoriasis    Scoliosis    cervicothoracic region    Past Surgical History: Past Surgical History:  Procedure Laterality Date   BLADDER SUSPENSION  2008   sling   BREAST EXCISIONAL BIOPSY Left    BUNIONECTOMY  2008   CARDIOVASCULAR STRESS TEST  09-30-2013   dr Marlou Porch   normal nuclear study/  no ischemia/  normal LV function and wall motion,  ef 61%   CATARACT EXTRACTION W/ INTRAOCULAR LENS  IMPLANT, BILATERAL  2016   COLONOSCOPY WITH ESOPHAGOGASTRODUODENOSCOPY (EGD)  04-30-2020  [facility] [place]   GANGLION CYST EXCISION Left 06/20/2021   Procedure: Excision of left foot ganglion cyst;  Surgeon: Wylene Simmer, MD;  Location: [place];  Service: Orthopedics;  Laterality: Left;  11mn   HAND SURGERY Right 11/02/12   [place] 11/09/2018   Procedure: RADIOACTIVE SEED GUIDED EXCISIONAL RIGHT BREAST BIOPSY AND RIGHT AREOLA BIOPSY;  Surgeon: WRolm Bookbinder MD;  [place]  Service: General;  Laterality: Right;   TONSILLECTOMY  1973  age 89  VAGINAL HYSTERECTOMY  1988  age 66  VUL[place] 08/29/2014   Procedure:  VGeorgena Spurling  Surgeon: EEveritt Amber MD;  [facility]  Service: Gynecology;  Laterality: N/A;   VULVECTOMY N/A 08/29/2014   Procedure: WIDE LOCAL EXCISION OF VULVA;  Surgeon: EEveritt Amber MD;  [place]  Service: Gynecology;  Laterality: N/A;   VULVECTOMY N/A 05/04/2015   Procedure: WIDE LOCAL EXCISION LEFT  VULVAR WITH BIOPSY OF RIGHT VULVA;  Surgeon: EEveritt Amber MD;  [place]  Service: Gynecology;  Laterality: N/A;   VULVECTOMY Left 01/22/2016  Procedure: PARTIAL SIMPLE VULVECTOMY;  Surgeon: Everitt Amber, MD;  [facility];  Service: Gynecology;  Laterality: Left;   VULVECTOMY N/A 02/14/2016   Procedure: WIDE EXCISION VULVECTOMY;  Surgeon: Everitt Amber, MD;  [facility]: [facility];  Service: Gynecology;  Laterality: N/A;   VULVECTOMY N/A 05/09/2020   Procedure: WIDE LOCAL EXCISION VULVECTOMY;  Surgeon: Everitt Amber, MD;  Location[facility];  Service: Gynecology;  Laterality: N/A;    Social History: Social History   Socioeconomic History   Marital status: Single    Spouse name: Not on file   Number of children: Not on file   Years of education: Not on file   Highest education level: Not on file  Occupational History   Not on file  Tobacco Use   Smoking status: Former    Packs/day: 1.00    Years: 5.00    Total pack years: 5.00    Types: Cigarettes    Quit date: 10/05/1978    Years since quitting: 43.1   Smokeless tobacco: Never  Vaping Use   Vaping Use: Never used  Substance and Sexual Activity   Alcohol use: Yes    Comment: occas   Drug use: No   Sexual activity: Not on file  Other Topics Concern   Not on file  Social History Narrative   ** Merged History Encounter **       Social Determinants of Health   Financial Resource Strain: Not on file  Food Insecurity: Not on file  Transportation Needs: Not on file  Physical Activity: Not on file   Stress: Not on file  Social Connections: Not on file  Intimate Partner Violence: Not on file    Family History: Family History  Problem Relation Age of Onset   Aortic aneurysm Mother    AAA (abdominal aortic aneurysm) Mother    Diabetes Father    Other Father        enlarged heart   Arrhythmia Brother    Heart attack Maternal Grandfather    Arrhythmia Brother    Thyroid cancer Daughter 75   Breast cancer Cousin     Review of Systems: She denies any fevers, chills or shortness of breath.  Physical Exam: Vital Signs BP 138/82 (BP Location: Left Arm, Patient Position: Sitting, Cuff Size: Normal)   Pulse 84   Temp 98.2 F (36.8 C) (Oral)   Resp 16   Ht '5\' 3"'$  (1.6 m)   Wt 121 lb (54.9 kg)   SpO2 98%   BMI 21.43 kg/m   Physical Exam  Constitutional:      General: Not in acute distress.    Appearance: Normal appearance. Not ill-appearing.  HENT:     Head: Normocephalic and atraumatic.  Eyes:     Pupils: Pupils are equal, round Neck:     Musculoskeletal: Normal range of motion.  Cardiovascular:     Rate and Rhythm: Normal rate Pulmonary:     Effort: Pulmonary effort is normal. No respiratory distress.  Musculoskeletal: Normal range of motion.  Lower extremities: No varicose veins noted.  No swelling of the legs. Skin:    General: Skin is warm and dry.     Findings: No erythema or rash.  Neurological:     Mental Status: Alert and oriented to person, place, and time. Mental status is at baseline.  Psychiatric:        Mood and Affect: Mood normal.        Behavior: Behavior normal.  Assessment/Plan: The patient is scheduled for bilateral breast reduction with possible liposuction with Dr. Marla Roe.  Risks, benefits, and alternatives of procedure discussed, questions answered and consent obtained.    Smoking Status: Non-smoker, Counseling Given?  N/A Last Mammogram: 04/18/21; Results: BI-RADS Category 1: Negative   Caprini Score: 7; Risk Factors include:  Age, history of hormone replacement, history of malignancy and length of planned surgery. Recommendation for mechanical and possibly pharmacological prophylaxis. Encourage early ambulation.   Pictures obtained: Today  Post-op Rx sent to pharmacy: Oxycodone, Zofran, Keflex.  Patient reports she has taken oxycodone in the past without issue.  Patient was provided with the breast reduction and General Surgical Risk consent document and Pain Medication Agreement prior to their appointment.  They had adequate time to read through the risk consent documents and Pain Medication Agreement. We also discussed them in person together during this preop appointment. All of their questions were answered to their satisfaction.  Recommended calling if they have any further questions.  Risk consent form and Pain Medication Agreement to be scanned into patient's chart.  The risk that can be encountered with breast reduction were discussed and include the following but not limited to these:  Breast asymmetry, fluid accumulation, firmness of the breast, inability to breast feed, loss of nipple or areola, skin loss, decrease or no nipple sensation, fat necrosis of the breast tissue, bleeding, infection, healing delay.  There are risks of anesthesia, changes to skin sensation and injury to nerves or blood vessels.  The muscle can be temporarily or permanently injured.  You may have an allergic reaction to tape, suture, glue, blood products which can result in skin discoloration, swelling, pain, skin lesions, poor healing.  Any of these can lead to the need for revisonal surgery or stage procedures.  A reduction has potential to interfere with diagnostic procedures.  Nipple or breast piercing can increase risks of infection.  This procedure is best done when the breast is fully developed.  Changes in the breast will continue to occur over time.  Pregnancy can alter the outcomes of previous breast reduction surgery, weight gain and  weigh loss can also effect the long term appearance.   Recommend holding vitamin D, co-Q10, turmeric 1 to 2 weeks prior to surgery. Recommend holding estradiol cream 2 weeks prior to surgery.   We will send clearance request to hold estrogen patch 2 weeks prior and 2 weeks after surgery to patient's GYN provider.  Pictures were obtained with the patient and placed in the patient's chart with the patient's permission.  Electronically signed by: Clance Boll, PA-C 11/07/2021 4:07 PM

## 2021-11-06 NOTE — Therapy (Addendum)
OUTPATIENT PHYSICAL THERAPY    Patient Name: Stephanie Sanchez MRN: [id number] DOB:Jun 23, 1955, 66 y.o., female Today's Date: 02/21/2022  PHYSICAL THERAPY DISCHARGE SUMMARY  Visits from Start of Care: 3  Current functional level related to goals / functional outcomes: unknown   Remaining deficits: unknown   Education / Equipment: Management of condition; HEP   Patient agrees to discharge. Patient goals were not met. Patient is being discharged due to not returning since the last visit.     Past Medical History:  Diagnosis Date   Arthritis    Coronary artery disease involving native coronary artery    cardiologist--- dr h. 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[place])    first dx 2013;  s/p  WLE and Vulvectomy's   PONV (postoperative nausea and vomiting)    Psoriasis    Scoliosis    cervicothoracic region   Past Surgical History:  Procedure Laterality Date   BLADDER SUSPENSION  2008   sling   BREAST EXCISIONAL BIOPSY Left    BREAST REDUCTION SURGERY Bilateral 12/05/2021   Procedure: MAMMARY REDUCTION  (BREAST);  Surgeon: Wallace Going, DO;  Location: [place];  Service: Plastics;  Laterality: Bilateral;  3 hours   BUNIONECTOMY  2008   CARDIOVASCULAR STRESS TEST  09-30-2013   dr Marlou Porch   normal nuclear study/  no ischemia/  normal LV function and wall motion,  ef 61%   CATARACT EXTRACTION W/ INTRAOCULAR LENS  IMPLANT, BILATERAL  2016   COLONOSCOPY WITH ESOPHAGOGASTRODUODENOSCOPY (EGD)  04-30-2020   [facility] [place]   GANGLION CYST EXCISION Left 06/20/2021   Procedure: Excision of left foot ganglion cyst;  Surgeon: Wylene Simmer, MD;  Location: [place];  Service: Orthopedics;  Laterality: Left;  30mn   HAND SURGERY Right 11/02/12   [place] [facility] [place] [facility] [facility] 11/09/2018   Procedure: RADIOACTIVE SEED GUIDED EXCISIONAL RIGHT BREAST BIOPSY AND RIGHT AREOLA BIOPSY;  Surgeon: WRolm Bookbinder MD;  [place]  Service: General;  Laterality: Right;   TONSILLECTOMY  1973  age [id number]  VAGINAL HYSTERECTOMY  1988  age 66  VULVA /[place] N/A 08/29/2014   Procedure: VGeorgena Spurling  Surgeon: EEveritt Amber MD;  [facility]  Service: Gynecology;  Laterality: N/A;   VULVECTOMY N/A 08/29/2014   Procedure: WIDE LOCAL EXCISION OF VULVA;  Surgeon: EEveritt Amber MD;  [place]  Service: Gynecology;  Laterality: N/A;   VULVECTOMY N/A 05/04/2015   Procedure: WIDE LOCAL EXCISION LEFT  VULVAR WITH BIOPSY OF RIGHT VULVA;  Surgeon: EEveritt Amber MD;  [place]  Service: Gynecology;  Laterality: N/A;   VULVECTOMY Left 01/22/2016   Procedure: PARTIAL SIMPLE VULVECTOMY;  Surgeon: EEveritt Amber MD;  [facility]  Service: Gynecology;  Laterality: Left;   VULVECTOMY N/A 02/14/2016  Procedure: WIDE EXCISION VULVECTOMY;  Surgeon: Everitt Amber, MD;  [facility];  Service: Gynecology;  Laterality: N/A;   VULVECTOMY N/A 05/09/2020   Procedure: WIDE LOCAL EXCISION VULVECTOMY;  Surgeon: Everitt Amber, MD;  [facility];  Service: Gynecology;  Laterality: N/A;   Patient Active Problem List   Diagnosis Date Noted   Changing skin lesion 01/10/2022   Urge incontinence 06/11/2020   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[place]) 05/09/2020   Encounter for counseling 03/18/2019   Symptomatic mammary  hypertrophy 03/18/2019   Neck pain 03/18/2019   Back pain 03/18/2019   Vulval cellulitis 84/13/2440   Lichen simplex chronicus 09/26/2014   Chest pain 10/21/2013   Dyspnea 10/21/2013   GERD (gastroesophageal reflux disease) 10/21/2013   Idiopathic scoliosis 10/21/2013   Personal history of colonic polyps 10/21/2013   Genital atrophy of female 07/02/2012    PCP: Crist Infante, MD  REFERRING PROVIDER: Crist Infante, MD  REFERRING DIAG: M41.9 (ICD-10-CM) - Scoliosis, unspecified  THERAPY DIAG:  Abnormal posture  Chronic pain of right knee  Difficulty in walking, not elsewhere classified  Rationale for Evaluation and Treatment Rehabilitation  ONSET DATE: chronic  SUBJECTIVE:                                                                                                                                                                                      SUBJECTIVE STATEMENT: Patient states she is having a bit of L lumbar and hip pain at start of session today. She had some medial R knee pain following previous session.  PERTINENT HISTORY: Chronic scoliosis  PAIN:  Are you having pain? yes 3/10 right hip/lumbar area PRECAUTIONS: None  WEIGHT BEARING RESTRICTIONS No  FALLS:  Has patient fallen in last 6 months? No  LIVING ENVIRONMENT: Lives with: lives with their family  OCCUPATION: retired  PLOF: Independent  PATIENT GOALS aquatic & machine exercises  OBJECTIVE:    PATIENT SURVEYS:  FOTO 54  COGNITION:  Overall cognitive status: Within functional limits for tasks assessed     SENSATION: WFL  POSTURE: Lumbar levoscoliosis, thoraco dextroscoliosis, Rt hip elevation    MMT:  lb via hand held dynamometry  MMT Right eval Left eval  Shoulder flexion 19.7 22.4  Shoulder abduction 24.3 17.7  Shoulder extension 13.2 11.2  Shoulder internal rotation 12.5 11.1  Shoulder external rotation 12.3 13.5  Hip abduction- sidelying 21.7 22.5  (Blank rows =  not tested)     TODAY'S TREATMENT:  Aquatics Treatment took place in water 3.25-4.8 ft . Temp of water was 91.  Pt entered/exited the pool via stairs (step through pattern) independently with bilat rail.  Warmup - walking forward, backward, side-stepping Hamstring stretch + QL stretch supported on step 2 Lumbar stretching using kick board in hip hinge position 3x20s hold Plank on squoodle 3x 30s hold; 10x shoulder flex, 10x hip ext  Suspended plank x 3 attempts best hold x8s. Side to side pendulum holding x30s x 2 for QL stretch R/L Forward to back pendulum x6-8 rotations Side plank Left on 3rd step with right le add/abd x10  Seated 4th step add isometrics w/weighted ball, flutter kick 3x20-25   Weighted ball press against wall feet together then staggered x 10 ea    Pt requires buoyancy for support and to offload joints with strengthening exercises. Viscosity of the water is needed for resistance of strengthening; water current perturbations provides challenge to standing balance unsupported, requiring increased core activation.    PATIENT EDUCATION: Education details: Geophysicist/field seismologist of condition, POC, HEP, exercise form/rationale  Person educated: Patient Education method: Explanation, Demonstration, Tactile cues, Verbal cues, and Handouts Education comprehension: verbalized understanding, returned demonstration, verbal cues required, tactile cues required, and needs further education   HOME EXERCISE PROGRAM: PRI 90-90 hip lift with Rt arm reach, Lt sidelying knee toward knee, Standing unsupported right lift with right trunk rotation [id number]  6/20: This HEP was provided last POC and will be built upon in this episode.   ASSESSMENT:  CLINICAL IMPRESSION: Progressed core strengthening using water pilates. Pt requires vc and demonstration with execution for proper technique.  Unable to hold suspended plank statically (without core instability).  Focused on core strengthening and  strength; left QL strengthening.  She tolerates very well. Has reduction in pain upon completion to 0/10.  She states she feels the ms spasm in right mid/low back release with aquatic exercises.       OBJECTIVE IMPAIRMENTS decreased activity tolerance, decreased strength, increased muscle spasms, improper body mechanics, postural dysfunction, and pain.   ACTIVITY LIMITATIONS carrying, lifting, bending, squatting, sleeping, and stairs  PARTICIPATION LIMITATIONS: meal prep, cleaning, driving, community activity, and yard work  PERSONAL FACTORS  chronic scoliosis  are also affecting patient's functional outcome.   REHAB POTENTIAL: Good  CLINICAL DECISION MAKING: Stable/uncomplicated  EVALUATION COMPLEXITY: Low   GOALS: Goals reviewed with patient? Yes  SHORT TERM GOALS: Target date: 11/12/2021    Able to demo sidelying knee toward knee exercise without cuing Baseline: Goal status: INITIAL   LONG TERM GOALS: Target date: 01/17/22  Hip abduction strength via dynamometry to meet age norm values Baseline: 38lb is norm value Goal status: INITIAL  2.  FOTO to meet stated goal Baseline:  Goal status: INITIAL  3.  Able to demo comfortable posture with less hip hike per pt subjective report Baseline:  Goal status: INITIAL  4.  Pt will be independent in long term land & aquatic HEP for continued strengthening Baseline:  Goal status: INITIAL     PLAN: PT FREQUENCY: 1x/week  PT DURATION: 12 weeks  PLANNED INTERVENTIONS: Therapeutic exercises, Therapeutic activity, Neuromuscular re-education, Balance training, Gait training, Patient/Family education, Joint mobilization, Stair training, Aquatic Therapy, Dry Needling, Electrical stimulation, Spinal mobilization, Cryotherapy, Moist heat, Taping, Manual therapy, and Re-evaluation  PLAN FOR NEXT SESSION: focusing on Rt hip abd & Lt oblique activaiton  Roczen Waymire (Frankie) Feleshia Zundel MPT 02/21/22 8:48 AM  Addended Stanton Kidney Tharon Aquas) Keaira Whitehurst  MPT [phone] 02/21/22

## 2021-11-06 NOTE — H&P (View-Only) (Signed)
Patient ID: Stephanie Sanchez, female    DOB: 02-Feb-1956, 66 y.o.   MRN: [id number]  Chief Complaint  Patient presents with   Pre-op Exam      ICD-10-CM   1. Symptomatic mammary hypertrophy  N62     2. Neck pain  M54.2        History of Present Illness: Stephanie Sanchez is a 66 y.o.  female  with a history of macromastia.  She presents for preoperative evaluation for upcoming procedure, Bilateral Breast Reduction with possible liposuction, scheduled for 12/05/2021 with Dr.  Marla Roe  The patient has had problems with anesthesia.  Patient states that in the past she has had postoperative nausea and vomiting.  Patient denies any personal history of breast cancer, although she does report she had a lumpectomy several years ago.  She reports she has a cousin with breast cancer.  She denies any cardiac disease.  She denies being on any blood thinners.  She denies being a smoker.  Patient reports she is on an estrogen patch 2 times per week.  Patient states she does not know the exact reason uses the patch.  Patient denies history of miscarriage.  She denies any history of blood clots or family history of blood clots.  She denies any recent traumas, surgeries, infections, strokes or heart attacks.  She denies history of Crohn's or ulcerative colitis.  She denies any COPD or asthma.  She does report history of extramammary Paget's disease.  Patient states she is currently at 19 DDD cup.  She states that she would like to be around a B cup.  Summary of Previous Visit: Patient was previously scheduled for breast reduction surgery in February 2023, however this was canceled due to a vulvectomy and left foot ganglion cyst excision in February.  STN on the right is 29 cm and the left is 28 cm.  Preoperative bra size equals double D/triple D cup.  Estimated excess breast tissue to be removed at time of surgery: 290 grams  Job: Retired  [place] Significant for: Macromastia, GERD, extramammary  Paget's disease   Past Medical History: Allergies: Allergies  Allergen Reactions   Codeine Itching, Nausea And Vomiting, Rash and Nausea Only    "comes out like a burn/rash" "comes out like a burn/rash"    Current Medications:  Current Outpatient Medications:    b complex vitamins capsule, Take 1 capsule by mouth daily., Disp: , Rfl:    Calcium Carbonate (CALCIUM 500 PO), Take by mouth in the morning and at bedtime., Disp: , Rfl:    Cholecalciferol (VITAMIN D) 2000 UNITS tablet, Take 2,000 Units by mouth daily., Disp: , Rfl:    Coenzyme Q10 (COQ-10) 100 MG CAPS, Take by mouth at bedtime., Disp: , Rfl:    Cyanocobalamin (VITAMIN B-12 PO), Take 1 tablet by mouth daily., Disp: , Rfl:    estradiol (ESTRACE VAGINAL) 0.1 MG/GM vaginal cream, Place 1 Applicatorful vaginally at bedtime., Disp: 42.5 g, Rfl: 12   estradiol (VIVELLE-DOT) 0.05 MG/24HR patch, Place 1 patch onto the skin 2 (two) times a week., Disp: , Rfl:    magnesium gluconate (MAGONATE) 500 MG tablet, Take 500 mg by mouth as needed (cramps)., Disp: , Rfl:    omega-3 acid ethyl esters (LOVAZA) 1 g capsule, Take 1 g by mouth daily., Disp: , Rfl:    OVER THE COUNTER MEDICATION, Take 1,000 mg by mouth daily. Ester C '1000mg'$ , Disp: , Rfl:    Probiotic Product (PROBIOTIC PO),  Take by mouth., Disp: , Rfl:    rosuvastatin (CRESTOR) 10 MG tablet, Take 1 tablet (10 mg total) by mouth daily. (Patient taking differently: Take 10 mg by mouth at bedtime.), Disp: 90 tablet, Rfl: 3   Specialty Vitamins Products (COLLAGEN ULTRA) CAPS, Take by mouth daily., Disp: , Rfl:    telmisartan (MICARDIS) 40 MG tablet, Take 40 mg by mouth daily., Disp: , Rfl:    tretinoin (RETIN-A) 0.1 % cream, Apply topically at bedtime., Disp: , Rfl:    TURMERIC CURCUMIN PO, Take 1,600 mg by mouth 3 (three) times daily., Disp: , Rfl:    Ubiquinol 100 MG CAPS, Take by mouth 1 day or 1 dose., Disp: , Rfl:    zinc gluconate 50 MG tablet, Take 50 mg by mouth daily., Disp: ,  Rfl:   Past Medical Problems: Past Medical History:  Diagnosis Date   Arthritis    Coronary artery disease involving native coronary artery    cardiologist--- dr h. 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[place])    first dx 2013;  s/p  WLE and Vulvectomy's   PONV (postoperative nausea and vomiting)    Psoriasis    Scoliosis    cervicothoracic region    Past Surgical History: Past Surgical History:  Procedure Laterality Date   BLADDER SUSPENSION  2008   sling   BREAST EXCISIONAL BIOPSY Left    BUNIONECTOMY  2008   CARDIOVASCULAR STRESS TEST  09-30-2013   dr Marlou Porch   normal nuclear study/  no ischemia/  normal LV function and wall motion,  ef 61%   CATARACT EXTRACTION W/ INTRAOCULAR LENS  IMPLANT, BILATERAL  2016   COLONOSCOPY WITH ESOPHAGOGASTRODUODENOSCOPY (EGD)  04-30-2020  [facility] [place]   GANGLION CYST EXCISION Left 06/20/2021   Procedure: Excision of left foot ganglion cyst;  Surgeon: Wylene Simmer, MD;  Location: [place];  Service: Orthopedics;  Laterality: Left;  69mn   HAND SURGERY Right 11/02/12   [place] 11/09/2018   Procedure: RADIOACTIVE SEED GUIDED EXCISIONAL RIGHT BREAST BIOPSY AND RIGHT AREOLA BIOPSY;  Surgeon: WRolm Bookbinder MD;  [place]  Service: General;  Laterality: Right;   TONSILLECTOMY  1973  age 89  VAGINAL HYSTERECTOMY  1988  age 66  VULVA /[place] N/[place] 08/29/2014   Procedure:  VGeorgena Spurling  Surgeon: EEveritt Amber MD;  [facility]  Service: Gynecology;  Laterality: N/A;   VULVECTOMY N/A 08/29/2014   Procedure: WIDE LOCAL EXCISION OF VULVA;  Surgeon: EEveritt Amber MD;  [place]  Service: Gynecology;  Laterality: N/A;   VULVECTOMY N/A 05/04/2015   Procedure: WIDE LOCAL EXCISION LEFT  VULVAR WITH BIOPSY OF RIGHT VULVA;  Surgeon: EEveritt Amber MD;  [place]  Service: Gynecology;  Laterality: N/A;   VULVECTOMY Left 01/22/2016  Procedure: PARTIAL SIMPLE VULVECTOMY;  Surgeon: Everitt Amber, MD;  [facility];  Service: Gynecology;  Laterality: Left;   VULVECTOMY N/A 02/14/2016   Procedure: WIDE EXCISION VULVECTOMY;  Surgeon: Everitt Amber, MD;  Location: [facility];  Service: Gynecology;  Laterality: N/A;   VULVECTOMY N/A 05/09/2020   Procedure: WIDE LOCAL EXCISION VULVECTOMY;  Surgeon: Everitt Amber, MD;  Location: [facility];  Service: Gynecology;  Laterality: N/A;    Social History: Social History   Socioeconomic History   Marital status: Single    Spouse name: Not on file   Number of children: Not on file   Years of education: Not on file   Highest education level: Not on file  Occupational History   Not on file  Tobacco Use   Smoking status: Former    Packs/day: 1.00    Years: 5.00    Total pack years: 5.00    Types: Cigarettes    Quit date: 10/05/1978    Years since quitting: 43.1   Smokeless tobacco: Never  Vaping Use   Vaping Use: Never used  Substance and Sexual Activity   Alcohol use: Yes    Comment: occas   Drug use: No   Sexual activity: Not on file  Other Topics Concern   Not on file  Social History Narrative   ** Merged History Encounter **       Social Determinants of Health   Financial Resource Strain: Not on file  Food Insecurity: Not on file  Transportation Needs: Not on file  Physical Activity: Not on file   Stress: Not on file  Social Connections: Not on file  Intimate Partner Violence: Not on file    Family History: Family History  Problem Relation Age of Onset   Aortic aneurysm Mother    AAA (abdominal aortic aneurysm) Mother    Diabetes Father    Other Father        enlarged heart   Arrhythmia Brother    Heart attack Maternal Grandfather    Arrhythmia Brother    Thyroid cancer Daughter 52   Breast cancer Cousin     Review of Systems: She denies any fevers, chills or shortness of breath.  Physical Exam: Vital Signs BP 138/82 (BP Location: Left Arm, Patient Position: Sitting, Cuff Size: Normal)   Pulse 84   Temp 98.2 F (36.8 C) (Oral)   Resp 16   Ht '5\' 3"'$  (1.6 m)   Wt 121 lb (54.9 kg)   SpO2 98%   BMI 21.43 kg/m   Physical Exam  Constitutional:      General: Not in acute distress.    Appearance: Normal appearance. Not ill-appearing.  HENT:     Head: Normocephalic and atraumatic.  Eyes:     Pupils: Pupils are equal, round Neck:     Musculoskeletal: Normal range of motion.  Cardiovascular:     Rate and Rhythm: Normal rate Pulmonary:     Effort: Pulmonary effort is normal. No respiratory distress.  Musculoskeletal: Normal range of motion.  Lower extremities: No varicose veins noted.  No swelling of the legs. Skin:    General: Skin is warm and dry.     Findings: No erythema or rash.  Neurological:     Mental Status: Alert and oriented to person, place, and time. Mental status is at baseline.  Psychiatric:        Mood and Affect: Mood normal.        Behavior: Behavior normal.  Assessment/Plan: The patient is scheduled for bilateral breast reduction with possible liposuction with Dr. Marla Roe.  Risks, benefits, and alternatives of procedure discussed, questions answered and consent obtained.    Smoking Status: Non-smoker, Counseling Given?  N/A Last Mammogram: 04/18/21; Results: BI-RADS Category 1: Negative   Caprini Score: 7; Risk Factors include:  Age, history of hormone replacement, history of malignancy and length of planned surgery. Recommendation for mechanical and possibly pharmacological prophylaxis. Encourage early ambulation.   Pictures obtained: Today  Post-op Rx sent to pharmacy: Oxycodone, Zofran, Keflex.  Patient reports she has taken oxycodone in the past without issue.  Patient was provided with the breast reduction and General Surgical Risk consent document and Pain Medication Agreement prior to their appointment.  They had adequate time to read through the risk consent documents and Pain Medication Agreement. We also discussed them in person together during this preop appointment. All of their questions were answered to their satisfaction.  Recommended calling if they have any further questions.  Risk consent form and Pain Medication Agreement to be scanned into patient's chart.  The risk that can be encountered with breast reduction were discussed and include the following but not limited to these:  Breast asymmetry, fluid accumulation, firmness of the breast, inability to breast feed, loss of nipple or areola, skin loss, decrease or no nipple sensation, fat necrosis of the breast tissue, bleeding, infection, healing delay.  There are risks of anesthesia, changes to skin sensation and injury to nerves or blood vessels.  The muscle can be temporarily or permanently injured.  You may have an allergic reaction to tape, suture, glue, blood products which can result in skin discoloration, swelling, pain, skin lesions, poor healing.  Any of these can lead to the need for revisonal surgery or stage procedures.  A reduction has potential to interfere with diagnostic procedures.  Nipple or breast piercing can increase risks of infection.  This procedure is best done when the breast is fully developed.  Changes in the breast will continue to occur over time.  Pregnancy can alter the outcomes of previous breast reduction surgery, weight gain and  weigh loss can also effect the long term appearance.   Recommend holding vitamin D, co-Q10, turmeric 1 to 2 weeks prior to surgery. Recommend holding estradiol cream 2 weeks prior to surgery.   We will send clearance request to hold estrogen patch 2 weeks prior and 2 weeks after surgery to patient's GYN provider.  Pictures were obtained with the patient and placed in the patient's chart with the patient's permission.  Electronically signed by: Clance Boll, PA-C 11/07/2021 4:07 PM

## 2021-11-07 ENCOUNTER — Ambulatory Visit (INDEPENDENT_AMBULATORY_CARE_PROVIDER_SITE_OTHER): Payer: Medicare Other | Admitting: Student

## 2021-11-07 VITALS — BP 138/82 | HR 84 | Temp 98.2°F | Resp 16 | Ht 63.0 in | Wt 121.0 lb

## 2021-11-07 DIAGNOSIS — N62 Hypertrophy of breast: Secondary | ICD-10-CM

## 2021-11-07 DIAGNOSIS — M542 Cervicalgia: Secondary | ICD-10-CM

## 2021-11-07 DIAGNOSIS — L989 Disorder of the skin and subcutaneous tissue, unspecified: Secondary | ICD-10-CM

## 2021-11-07 MED ORDER — OXYCODONE HCL 5 MG PO TABS: 5.0000 mg | ORAL_TABLET | Freq: Three times a day (TID) | ORAL | 0 refills | Status: DC | PRN

## 2021-11-07 MED ORDER — CEPHALEXIN 500 MG PO CAPS: 500.0000 mg | ORAL_CAPSULE | Freq: Four times a day (QID) | ORAL | 0 refills | Status: AC

## 2021-11-07 MED ORDER — ONDANSETRON HCL 4 MG PO TABS: 4.0000 mg | ORAL_TABLET | Freq: Three times a day (TID) | ORAL | 0 refills | Status: DC | PRN

## 2021-11-08 ENCOUNTER — Telehealth: Payer: Self-pay

## 2021-11-08 NOTE — Telephone Encounter (Signed)
Faxed surgery clearance to Dr. Runell Gess

## 2021-11-12 ENCOUNTER — Encounter: Payer: Medicare Other | Admitting: Student

## 2021-11-13 ENCOUNTER — Encounter: Payer: Self-pay | Admitting: Student

## 2021-11-13 NOTE — Progress Notes (Signed)
Surgical Clearance has been received from Dr. Runell Gess for patient's upcoming surgery with Dr. Marla Roe.  Per Dr. Runell Gess, patient may hold estrogen patch 2 weeks before and 2 weeks after surgery.  Attempted to call patient to inform but she did not answer. Voicemail left.

## 2021-11-19 ENCOUNTER — Telehealth: Payer: Self-pay | Admitting: *Deleted

## 2021-11-19 NOTE — Telephone Encounter (Signed)
Pt called office in response to vm left by [place], [place]. Verified surgery and f/u appt info with pt. Advised pt to hold estrogen patch 2 weeks pre and post-op per note by Donnamarie Rossetti, PA-C. Pt callback # [phone]. Message forwarded to provider.

## 2021-11-21 ENCOUNTER — Encounter (HOSPITAL_BASED_OUTPATIENT_CLINIC_OR_DEPARTMENT_OTHER): Payer: Self-pay

## 2021-11-21 ENCOUNTER — Ambulatory Visit (HOSPITAL_BASED_OUTPATIENT_CLINIC_OR_DEPARTMENT_OTHER): Payer: Medicare Other | Admitting: Physical Therapy

## 2021-11-28 ENCOUNTER — Other Ambulatory Visit: Payer: Self-pay

## 2021-11-28 ENCOUNTER — Ambulatory Visit (HOSPITAL_BASED_OUTPATIENT_CLINIC_OR_DEPARTMENT_OTHER): Payer: Medicare Other | Admitting: Physical Therapy

## 2021-11-28 ENCOUNTER — Encounter (HOSPITAL_BASED_OUTPATIENT_CLINIC_OR_DEPARTMENT_OTHER): Payer: Self-pay | Admitting: Plastic Surgery

## 2021-12-02 ENCOUNTER — Other Ambulatory Visit: Payer: Self-pay

## 2021-12-02 ENCOUNTER — Encounter (HOSPITAL_BASED_OUTPATIENT_CLINIC_OR_DEPARTMENT_OTHER)
Admission: RE | Admit: 2021-12-02 | Discharge: 2021-12-02 | Disposition: A | Discharge status: Home / Self Care | Payer: Medicare Other | Source: Ambulatory Visit | Attending: Plastic Surgery | Admitting: Plastic Surgery

## 2021-12-02 DIAGNOSIS — Z01810 Encounter for preprocedural cardiovascular examination: Secondary | ICD-10-CM | POA: Diagnosis not present

## 2021-12-04 NOTE — Anesthesia Preprocedure Evaluation (Signed)
Anesthesia Evaluation  Patient identified by MRN, date of birth, ID band Patient awake    Reviewed: Allergy & Precautions, NPO status , Patient's Chart, lab work & pertinent test results  History of Anesthesia Complications (+) PONV and history of anesthetic complications  Airway Mallampati: II  TM Distance: >3 FB Neck ROM: Full    Dental no notable dental hx. (+) Teeth Intact, Dental Advisory Given   Pulmonary former smoker,    Pulmonary exam normal breath sounds clear to auscultation       Cardiovascular hypertension, + CAD  Normal cardiovascular exam Rhythm:Regular Rate:Normal     Neuro/Psych    GI/Hepatic Neg liver ROS, GERD  ,  Endo/Other  negative endocrine ROS  Renal/GU negative Renal ROS     Musculoskeletal  (+) Arthritis , Osteoarthritis,    Abdominal   Peds  Hematology   Anesthesia Other Findings All: codeine  Reproductive/Obstetrics                            Anesthesia Physical Anesthesia Plan  ASA: 2  Anesthesia Plan: General   Post-op Pain Management: Dilaudid IV and Minimal or no pain anticipated   Induction: Intravenous  PONV Risk Score and Plan: 3 and Treatment may vary due to age or medical condition, Scopolamine patch - Pre-op, Midazolam and Ondansetron  Airway Management Planned: Oral ETT  Additional Equipment: None  Intra-op Plan:   Post-operative Plan: Extubation in OR  Informed Consent: I have reviewed the patients History and Physical, chart, labs and discussed the procedure including the risks, benefits and alternatives for the proposed anesthesia with the patient or authorized representative who has indicated his/her understanding and acceptance.     Dental advisory given  Plan Discussed with:   Anesthesia Plan Comments:        Anesthesia Quick Evaluation

## 2021-12-05 ENCOUNTER — Other Ambulatory Visit: Payer: Self-pay

## 2021-12-05 ENCOUNTER — Ambulatory Visit (HOSPITAL_BASED_OUTPATIENT_CLINIC_OR_DEPARTMENT_OTHER)
Admission: RE | Admit: 2021-12-05 | Discharge: 2021-12-05 | Disposition: A | Discharge status: Home / Self Care | Payer: Medicare Other | Attending: Plastic Surgery | Admitting: Plastic Surgery

## 2021-12-05 ENCOUNTER — Ambulatory Visit (HOSPITAL_BASED_OUTPATIENT_CLINIC_OR_DEPARTMENT_OTHER): Payer: Medicare Other | Admitting: Anesthesiology

## 2021-12-05 ENCOUNTER — Encounter (HOSPITAL_BASED_OUTPATIENT_CLINIC_OR_DEPARTMENT_OTHER)
Admission: RE | Disposition: A | Discharge status: Home / Self Care | Payer: Self-pay | Source: Home / Self Care | Attending: Plastic Surgery

## 2021-12-05 ENCOUNTER — Ambulatory Visit (HOSPITAL_BASED_OUTPATIENT_CLINIC_OR_DEPARTMENT_OTHER): Payer: Medicare Other | Admitting: Physical Therapy

## 2021-12-05 ENCOUNTER — Encounter (HOSPITAL_BASED_OUTPATIENT_CLINIC_OR_DEPARTMENT_OTHER): Payer: Self-pay | Admitting: Plastic Surgery

## 2021-12-05 DIAGNOSIS — M542 Cervicalgia: Secondary | ICD-10-CM

## 2021-12-05 DIAGNOSIS — M549 Dorsalgia, unspecified: Secondary | ICD-10-CM

## 2021-12-05 DIAGNOSIS — Z87891 Personal history of nicotine dependence: Secondary | ICD-10-CM | POA: Insufficient documentation

## 2021-12-05 DIAGNOSIS — I2510 Atherosclerotic heart disease of native coronary artery without angina pectoris: Secondary | ICD-10-CM | POA: Insufficient documentation

## 2021-12-05 DIAGNOSIS — I10 Essential (primary) hypertension: Secondary | ICD-10-CM | POA: Diagnosis not present

## 2021-12-05 DIAGNOSIS — N62 Hypertrophy of breast: Secondary | ICD-10-CM

## 2021-12-05 HISTORY — PX: BREAST REDUCTION SURGERY: SHX8

## 2021-12-05 SURGERY — MAMMOPLASTY, REDUCTION
Anesthesia: General | Site: Breast | Laterality: Bilateral

## 2021-12-05 MED ORDER — KETOROLAC TROMETHAMINE 30 MG/ML IJ SOLN
INTRAMUSCULAR | Status: AC
  Filled 2021-12-05: qty 1

## 2021-12-05 MED ORDER — CHLORHEXIDINE GLUCONATE CLOTH 2 % EX PADS: 6.0000 | MEDICATED_PAD | Freq: Once | CUTANEOUS | Status: DC

## 2021-12-05 MED ORDER — ONDANSETRON HCL 4 MG/2ML IJ SOLN
INTRAMUSCULAR | Status: AC
  Filled 2021-12-05: qty 2

## 2021-12-05 MED ORDER — SUGAMMADEX SODIUM 200 MG/2ML IV SOLN
INTRAVENOUS | Status: DC | PRN
  Administered 2021-12-05: 150 mg via INTRAVENOUS

## 2021-12-05 MED ORDER — SCOPOLAMINE 1 MG/3DAYS TD PT72
1.0000 | MEDICATED_PATCH | TRANSDERMAL | Status: DC
  Administered 2021-12-05: 1.5 mg via TRANSDERMAL

## 2021-12-05 MED ORDER — LIDOCAINE-EPINEPHRINE (PF) 1 %-1:200000 IJ SOLN
INTRAMUSCULAR | Status: AC
  Filled 2021-12-05: qty 30

## 2021-12-05 MED ORDER — MIDAZOLAM HCL 2 MG/2ML IJ SOLN
INTRAMUSCULAR | Status: AC
  Filled 2021-12-05: qty 2

## 2021-12-05 MED ORDER — KETOROLAC TROMETHAMINE 30 MG/ML IJ SOLN
15.0000 mg | Freq: Once | INTRAMUSCULAR | Status: AC | PRN
  Administered 2021-12-05: 15 mg via INTRAVENOUS

## 2021-12-05 MED ORDER — 0.9 % SODIUM CHLORIDE (POUR BTL) OPTIME
TOPICAL | Status: DC | PRN
  Administered 2021-12-05: 1000 mL

## 2021-12-05 MED ORDER — HYDROMORPHONE HCL 1 MG/ML IJ SOLN
INTRAMUSCULAR | Status: AC
  Filled 2021-12-05: qty 0.5

## 2021-12-05 MED ORDER — EPINEPHRINE PF 1 MG/ML IJ SOLN
INTRAMUSCULAR | Status: AC
  Filled 2021-12-05: qty 1

## 2021-12-05 MED ORDER — ONDANSETRON HCL 4 MG/2ML IJ SOLN
INTRAMUSCULAR | Status: DC | PRN
  Administered 2021-12-05: 4 mg via INTRAVENOUS

## 2021-12-05 MED ORDER — PHENYLEPHRINE 80 MCG/ML (10ML) SYRINGE FOR IV PUSH (FOR BLOOD PRESSURE SUPPORT)
PREFILLED_SYRINGE | INTRAVENOUS | Status: AC
  Filled 2021-12-05: qty 10

## 2021-12-05 MED ORDER — ONDANSETRON HCL 4 MG/2ML IJ SOLN: 4.0000 mg | Freq: Once | INTRAMUSCULAR | Status: DC | PRN

## 2021-12-05 MED ORDER — SCOPOLAMINE 1 MG/3DAYS TD PT72
MEDICATED_PATCH | TRANSDERMAL | Status: AC
  Filled 2021-12-05: qty 1

## 2021-12-05 MED ORDER — CEFAZOLIN SODIUM-DEXTROSE 2-4 GM/100ML-% IV SOLN
INTRAVENOUS | Status: AC
  Filled 2021-12-05: qty 100

## 2021-12-05 MED ORDER — DEXAMETHASONE SODIUM PHOSPHATE 4 MG/ML IJ SOLN
INTRAMUSCULAR | Status: DC | PRN
  Administered 2021-12-05: 5 mg via INTRAVENOUS

## 2021-12-05 MED ORDER — ROCURONIUM BROMIDE 100 MG/10ML IV SOLN
INTRAVENOUS | Status: DC | PRN
  Administered 2021-12-05: 50 mg via INTRAVENOUS

## 2021-12-05 MED ORDER — EPHEDRINE SULFATE (PRESSORS) 50 MG/ML IJ SOLN
INTRAMUSCULAR | Status: DC | PRN
  Administered 2021-12-05 (×3): 10 mg via INTRAVENOUS

## 2021-12-05 MED ORDER — SUCCINYLCHOLINE CHLORIDE 200 MG/10ML IV SOSY
PREFILLED_SYRINGE | INTRAVENOUS | Status: AC
  Filled 2021-12-05: qty 10

## 2021-12-05 MED ORDER — LIDOCAINE HCL (PF) 1 % IJ SOLN
INTRAMUSCULAR | Status: AC
  Filled 2021-12-05: qty 60

## 2021-12-05 MED ORDER — PROPOFOL 500 MG/50ML IV EMUL
INTRAVENOUS | Status: AC
  Filled 2021-12-05: qty 50

## 2021-12-05 MED ORDER — PROPOFOL 10 MG/ML IV BOLUS
INTRAVENOUS | Status: DC | PRN
  Administered 2021-12-05: 100 mg via INTRAVENOUS

## 2021-12-05 MED ORDER — EPHEDRINE 5 MG/ML INJ
INTRAVENOUS | Status: AC
  Filled 2021-12-05: qty 5

## 2021-12-05 MED ORDER — DEXAMETHASONE SODIUM PHOSPHATE 10 MG/ML IJ SOLN
INTRAMUSCULAR | Status: AC
  Filled 2021-12-05: qty 1

## 2021-12-05 MED ORDER — LIDOCAINE HCL (CARDIAC) PF 100 MG/5ML IV SOSY
PREFILLED_SYRINGE | INTRAVENOUS | Status: DC | PRN
  Administered 2021-12-05: 100 mg via INTRAVENOUS

## 2021-12-05 MED ORDER — FENTANYL CITRATE (PF) 100 MCG/2ML IJ SOLN
INTRAMUSCULAR | Status: AC
  Filled 2021-12-05: qty 2

## 2021-12-05 MED ORDER — LACTATED RINGERS IV SOLN: INTRAVENOUS | Status: DC

## 2021-12-05 MED ORDER — OXYCODONE HCL 5 MG PO TABS: 5.0000 mg | ORAL_TABLET | Freq: Once | ORAL | Status: DC | PRN

## 2021-12-05 MED ORDER — LIDOCAINE-EPINEPHRINE 1 %-1:100000 IJ SOLN
INTRAMUSCULAR | Status: AC
  Filled 2021-12-05: qty 1

## 2021-12-05 MED ORDER — LIDOCAINE 2% (20 MG/ML) 5 ML SYRINGE
INTRAMUSCULAR | Status: AC
  Filled 2021-12-05: qty 5

## 2021-12-05 MED ORDER — BUPIVACAINE HCL (PF) 0.25 % IJ SOLN
INTRAMUSCULAR | Status: AC
  Filled 2021-12-05: qty 30

## 2021-12-05 MED ORDER — OXYCODONE HCL 5 MG/5ML PO SOLN: 5.0000 mg | Freq: Once | ORAL | Status: DC | PRN

## 2021-12-05 MED ORDER — LIDOCAINE-EPINEPHRINE 1 %-1:100000 IJ SOLN
INTRAMUSCULAR | Status: DC | PRN
  Administered 2021-12-05: 40 mL via INTRAMUSCULAR

## 2021-12-05 MED ORDER — HYDROMORPHONE HCL 1 MG/ML IJ SOLN
0.2500 mg | INTRAMUSCULAR | Status: DC | PRN
  Administered 2021-12-05 (×2): 0.25 mg via INTRAVENOUS
  Administered 2021-12-05 (×2): 0.5 mg via INTRAVENOUS

## 2021-12-05 MED ORDER — ATROPINE SULFATE 0.4 MG/ML IV SOLN
INTRAVENOUS | Status: AC
  Filled 2021-12-05: qty 1

## 2021-12-05 MED ORDER — DEXMEDETOMIDINE HCL IN NACL 80 MCG/20ML IV SOLN
INTRAVENOUS | Status: AC
  Filled 2021-12-05: qty 20

## 2021-12-05 MED ORDER — DEXMEDETOMIDINE (PRECEDEX) IN NS 20 MCG/5ML (4 MCG/ML) IV SYRINGE
PREFILLED_SYRINGE | INTRAVENOUS | Status: DC | PRN
  Administered 2021-12-05: 8 ug via INTRAVENOUS

## 2021-12-05 MED ORDER — CEFAZOLIN SODIUM-DEXTROSE 2-4 GM/100ML-% IV SOLN
2.0000 g | INTRAVENOUS | Status: AC
  Administered 2021-12-05: 2 g via INTRAVENOUS

## 2021-12-05 MED ORDER — FENTANYL CITRATE (PF) 100 MCG/2ML IJ SOLN
INTRAMUSCULAR | Status: DC | PRN
  Administered 2021-12-05: 50 ug via INTRAVENOUS
  Administered 2021-12-05 (×2): 25 ug via INTRAVENOUS

## 2021-12-05 SURGICAL SUPPLY — 68 items
ADH SKN CLS APL DERMABOND .7 (GAUZE/BANDAGES/DRESSINGS) ×4
BAG DECANTER FOR FLEXI CONT (MISCELLANEOUS) ×2 IMPLANT
BINDER BREAST LRG (GAUZE/BANDAGES/DRESSINGS) ×1 IMPLANT
BINDER BREAST MEDIUM (GAUZE/BANDAGES/DRESSINGS) IMPLANT
BINDER BREAST XLRG (GAUZE/BANDAGES/DRESSINGS) IMPLANT
BINDER BREAST XXLRG (GAUZE/BANDAGES/DRESSINGS) IMPLANT
BIOPATCH RED 1 DISK 7.0 (GAUZE/BANDAGES/DRESSINGS) IMPLANT
BLADE HEX COATED 2.75 (ELECTRODE) ×2 IMPLANT
BLADE KNIFE PERSONA 10 (BLADE) ×4 IMPLANT
BLADE SURG 15 STRL LF DISP TIS (BLADE) IMPLANT
BLADE SURG 15 STRL SS (BLADE)
CANISTER SUCT 1200ML W/VALVE (MISCELLANEOUS) ×2 IMPLANT
COVER BACK TABLE 60X90IN (DRAPES) ×2 IMPLANT
COVER MAYO STAND STRL (DRAPES) ×2 IMPLANT
DERMABOND ADVANCED (GAUZE/BANDAGES/DRESSINGS) ×4
DERMABOND ADVANCED .7 DNX12 (GAUZE/BANDAGES/DRESSINGS) ×2 IMPLANT
DRAIN CHANNEL 19F RND (DRAIN) IMPLANT
DRAPE LAPAROSCOPIC ABDOMINAL (DRAPES) ×2 IMPLANT
DRSG OPSITE POSTOP 4X12 (GAUZE/BANDAGES/DRESSINGS) IMPLANT
DRSG OPSITE POSTOP 4X6 (GAUZE/BANDAGES/DRESSINGS) IMPLANT
DRSG PAD ABDOMINAL 8X10 ST (GAUZE/BANDAGES/DRESSINGS) ×6 IMPLANT
ELECT BLADE 4.0 EZ CLEAN MEGAD (MISCELLANEOUS) ×2
ELECT REM PT RETURN 9FT ADLT (ELECTROSURGICAL) ×2
ELECTRODE BLDE 4.0 EZ CLN MEGD (MISCELLANEOUS) ×1 IMPLANT
ELECTRODE REM PT RTRN 9FT ADLT (ELECTROSURGICAL) ×1 IMPLANT
EVACUATOR SILICONE 100CC (DRAIN) IMPLANT
GAUZE SPONGE 4X4 12PLY STRL LF (GAUZE/BANDAGES/DRESSINGS) IMPLANT
GLOVE BIO SURGEON STRL SZ 6.5 (GLOVE) ×6 IMPLANT
GLOVE BIOGEL PI IND STRL 7.0 (GLOVE) ×1 IMPLANT
GLOVE BIOGEL PI INDICATOR 7.0 (GLOVE) ×3
GOWN STRL REUS W/ TWL LRG LVL3 (GOWN DISPOSABLE) ×2 IMPLANT
GOWN STRL REUS W/TWL LRG LVL3 (GOWN DISPOSABLE) ×4
NDL FILTER BLUNT 18X1 1/2 (NEEDLE) ×1 IMPLANT
NDL HYPO 25X1 1.5 SAFETY (NEEDLE) ×1 IMPLANT
NDL SAFETY ECLIPSE 18X1.5 (NEEDLE) IMPLANT
NEEDLE FILTER BLUNT 18X 1/2SAF (NEEDLE) ×1
NEEDLE FILTER BLUNT 18X1 1/2 (NEEDLE) ×1 IMPLANT
NEEDLE HYPO 18GX1.5 SHARP (NEEDLE)
NEEDLE HYPO 25X1 1.5 SAFETY (NEEDLE) ×2 IMPLANT
NS IRRIG 1000ML POUR BTL (IV SOLUTION) IMPLANT
PACK BASIN DAY SURGERY FS (CUSTOM PROCEDURE TRAY) ×2 IMPLANT
PAD ALCOHOL SWAB (MISCELLANEOUS) IMPLANT
PAD FOAM SILICONE BACKED (GAUZE/BANDAGES/DRESSINGS) IMPLANT
PENCIL SMOKE EVACUATOR (MISCELLANEOUS) ×2 IMPLANT
PIN SAFETY STERILE (MISCELLANEOUS) IMPLANT
SLEEVE SCD COMPRESS KNEE MED (STOCKING) ×2 IMPLANT
SPIKE FLUID TRANSFER (MISCELLANEOUS) IMPLANT
SPONGE T-LAP 18X18 ~~LOC~~+RFID (SPONGE) ×4 IMPLANT
STRIP SUTURE WOUND CLOSURE 1/2 (MISCELLANEOUS) ×5 IMPLANT
SUT MNCRL AB 4-0 PS2 18 (SUTURE) ×6 IMPLANT
SUT MON AB 3-0 SH 27 (SUTURE) ×6
SUT MON AB 3-0 SH27 (SUTURE) ×4 IMPLANT
SUT MON AB 5-0 PS2 18 (SUTURE) IMPLANT
SUT PDS 3-0 CT2 (SUTURE) ×8
SUT PDS II 3-0 CT2 27 ABS (SUTURE) ×6 IMPLANT
SUT SILK 3 0 PS 1 (SUTURE) IMPLANT
SYR 3ML 23GX1 SAFETY (SYRINGE) IMPLANT
SYR 50ML LL SCALE MARK (SYRINGE) IMPLANT
SYR BULB IRRIG 60ML STRL (SYRINGE) ×2 IMPLANT
SYR CONTROL 10ML LL (SYRINGE) ×2 IMPLANT
TAPE MEASURE VINYL STERILE (MISCELLANEOUS) IMPLANT
TOWEL GREEN STERILE FF (TOWEL DISPOSABLE) ×4 IMPLANT
TRAY DSU PREP LF (CUSTOM PROCEDURE TRAY) ×2 IMPLANT
TUBE CONNECTING 20X1/4 (TUBING) ×2 IMPLANT
TUBING INFILTRATION IT-10001 (TUBING) IMPLANT
TUBING SET GRADUATE ASPIR 12FT (MISCELLANEOUS) IMPLANT
UNDERPAD 30X36 HEAVY ABSORB (UNDERPADS AND DIAPERS) ×4 IMPLANT
YANKAUER SUCT BULB TIP NO VENT (SUCTIONS) ×2 IMPLANT

## 2021-12-05 NOTE — Interval H&P Note (Signed)
History and Physical Interval Note:  12/05/2021 7:23 AM  Stephanie Sanchez  has presented today for surgery, with the diagnosis of Symptomatic mammary hypertrophy.  The various methods of treatment have been discussed with the patient and family. After consideration of risks, benefits and other options for treatment, the patient has consented to  Procedure(s) with comments: MAMMARY REDUCTION  (BREAST) (Bilateral) - 3 hours as a surgical intervention.  The patient's history has been reviewed, patient examined, no change in status, stable for surgery.  I have reviewed the patient's chart and labs.  Questions were answered to the patient's satisfaction.     Loel Lofty Cariah Salatino

## 2021-12-05 NOTE — Op Note (Signed)
Breast Reduction Op note:    DATE OF PROCEDURE: 12/05/2021  LOCATION: [place]  SURGEON: [facility] Laylamarie Meuser, DO  ASSISTANT: Donnamarie Rossetti, PA  PREOPERATIVE DIAGNOSIS 1. Macromastia 2. Neck Pain 3. Back Pain  POSTOPERATIVE DIAGNOSIS 1. Macromastia 2. Neck Pain 3. Back Pain  PROCEDURES 1. Bilateral breast reduction.  Right reduction 288 g, Left reduction 329 g  COMPLICATIONS: None.  DRAINS: none  INDICATIONS FOR PROCEDURE Stephanie Sanchez is a 66 y.o. year-old female born on Mar 14, 1956,with a history of symptomatic macromastia with concominant back pain, neck pain, shoulder grooving from her bra.   MRN: [id number]  CONSENT Informed consent was obtained directly from the patient. The risks, benefits and alternatives were fully discussed. Specific risks including but not limited to bleeding, infection, hematoma, seroma, scarring, pain, nipple necrosis, asymmetry, poor cosmetic results, and need for further surgery were discussed. The patient had ample opportunity to have her questions answered to her satisfaction.  DESCRIPTION OF PROCEDURE  Patient was brought into the operating room and placed in a supine position.  SCDs were placed and appropriate padding was performed.  Antibiotics were given. The patient underwent general anesthesia and the chest was prepped and draped in a sterile fashion.  A timeout was performed and all information was confirmed to be correct.  Right side: Preoperative markings were confirmed.  Incision lines were injected with local with epinephrine.  After waiting for vasoconstriction, the marked lines were incised.  A Wise-pattern superomedial breast reduction was performed by de-epithelializing the pedicle, using bovie to create the superomedial pedicle, and removing breast tissue from the superior, lateral, and inferior portions of the breast.  Care was taken to not undermine the breast pedicle. Hemostasis was achieved.   The nipple was gently rotated into position and the soft tissue closed with 4-0 Monocryl.   The pocket was irrigated and hemostasis confirmed.  The deep tissues were approximated with 3-0 PDS and 3-0 Monocryl sutures and the skin was closed with deep dermal and subcuticular 4-0 Monocryl sutures.  The nipple and skin flaps had good capillary refill at the end of the procedure.    Left side: Preoperative markings were confirmed.  Incision lines were injected with local with epinephrine.  After waiting for vasoconstriction, the marked lines were incised.  A Wise-pattern superomedial breast reduction was performed by de-epithelializing the pedicle, using bovie to create the superomedial pedicle, and removing breast tissue from the superior, lateral, and inferior portions of the breast.  Care was taken to not undermine the breast pedicle. Hemostasis was achieved.  The nipple was gently rotated into position and the soft tissue was closed with 4-0 Monocryl.  The patient was sat upright and size and shape symmetry was confirmed.  The pocket was irrigated and hemostasis confirmed.  The deep tissues were approximated with 3-0 PDS and 3-0 Monocryl sutures and the skin was closed with deep dermal and subcuticular 4-0 Monocryl sutures.  Dermabond was applied.  A breast binder and ABDs were placed.  The nipple and skin flaps had good capillary refill at the end of the procedure.  The patient tolerated the procedure well. The patient was allowed to wake from anesthesia and taken to the recovery room in satisfactory condition.  The advanced practice practitioner (APP) assisted throughout the case.  The APP was essential in retraction and counter traction when needed to make the case progress smoothly.  This retraction and assistance made it possible to see the tissue plans for the procedure.  The  assistance was needed for blood control, tissue re-approximation and assisted with closure of the incision site.

## 2021-12-05 NOTE — Transfer of Care (Signed)
Immediate Anesthesia Transfer of Care Note  Patient: Stephanie Sanchez  Procedure(s) Performed: MAMMARY REDUCTION  (BREAST) (Bilateral: Breast)  Patient Location: PACU  Anesthesia Type:General  Level of Consciousness: awake, alert , oriented, drowsy and patient cooperative  Airway & Oxygen Therapy: Patient Spontanous Breathing and Patient connected to face mask oxygen  Post-op Assessment: Report given to RN and Post -op Vital signs reviewed and stable  Post vital signs: Reviewed and stable  Last Vitals:  Vitals Value Taken Time  BP    Temp    Pulse    Resp    SpO2      Last Pain:  Vitals:   12/05/21 0633  TempSrc: Oral  PainSc: 0-No pain         Complications: No notable events documented.

## 2021-12-05 NOTE — Anesthesia Procedure Notes (Signed)
Procedure Name: Intubation Date/Time: 12/05/2021 7:40 AM  Performed by: Willa Frater, CRNAPre-anesthesia Checklist: Patient identified, Emergency Drugs available, Suction available and Patient being monitored Patient Re-evaluated:Patient Re-evaluated prior to induction Oxygen Delivery Method: Circle system utilized Preoxygenation: Pre-oxygenation with 100% oxygen Induction Type: IV induction Ventilation: Mask ventilation without difficulty Laryngoscope Size: Mac and 3 Grade View: Grade I Tube type: Oral Tube size: 6.5 mm Number of attempts: 1 Airway Equipment and Method: Stylet and Oral airway Placement Confirmation: ETT inserted through vocal cords under direct vision, positive ETCO2 and breath sounds checked- equal and bilateral Tube secured with: Tape Dental Injury: Teeth and Oropharynx as per pre-operative assessment

## 2021-12-05 NOTE — Anesthesia Postprocedure Evaluation (Signed)
Anesthesia Post Note  Patient: Stephanie Sanchez  Procedure(s) Performed: MAMMARY REDUCTION  (BREAST) (Bilateral: Breast)     Patient location during evaluation: PACU Anesthesia Type: General Level of consciousness: awake and alert Pain management: pain level controlled Vital Signs Assessment: post-procedure vital signs reviewed and stable Respiratory status: spontaneous breathing, nonlabored ventilation, respiratory function stable and patient connected to nasal cannula oxygen Cardiovascular status: blood pressure returned to baseline and stable Postop Assessment: no apparent nausea or vomiting Anesthetic complications: no   No notable events documented.  Last Vitals:  Vitals:   12/05/21 1020 12/05/21 1030  BP:  125/88  Pulse: 76 85  Resp: 13   Temp:  (!) 36 C  SpO2: 95% 97%    Last Pain:  Vitals:   12/05/21 1030  TempSrc: Oral  PainSc: 3                  Barnet Glasgow

## 2021-12-05 NOTE — Discharge Instructions (Addendum)
INSTRUCTIONS FOR AFTER BREAST SURGERY   You will likely have some questions about what to expect following your operation.  The following information will help you and your family understand what to expect when you are discharged from the hospital.  Following these guidelines will help ensure a smooth recovery and reduce risks of complications.  Postoperative instructions include information on: diet, wound care, medications and physical activity.  AFTER SURGERY Expect to go home after the procedure.  In some cases, you may need to spend one night in the hospital for observation.  DIET Breast surgery does not require a specific diet.  However, the healthier you eat the better your body can start healing. It is important to increasing your protein intake.  This means limiting the foods with sugar and carbohydrates.  Focus on vegetables and some meat.  If you have any liposuction during your procedure be sure to drink water.  If your urine is bright yellow, then it is concentrated, and you need to drink more water.  As a general rule after surgery, you should have 8 ounces of water every hour while awake.  If you find you are persistently nauseated or unable to take in liquids let us know.  NO TOBACCO USE or EXPOSURE.  This will slow your healing process and increase the risk of a wound.  WOUND CARE Leave the binder on for 3 days . Use fragrance free soap.   After 3 days you can remove the binder to shower. Once dry apply ACE wrap, binder or sports bra.  Use a mild soap like Dial, Dove and [place]. You may have Topifoam or Lipofoam on.  It is soft and spongy and helps keep you from getting creases if you have liposuction.  This can be removed before the shower and then replaced.  If you need more it is available on [employer] (Lipofoam). If you have steri-strips / tape directly attached to your skin leave them in place. It is OK to get these wet.   No baths, pools or hot tubs for four weeks. We close your  incision to leave the smallest and best-looking scar. No ointment or creams on your incisions until given the go ahead.  Especially not Neosporin (Too many skin reactions with this one).  A few weeks after surgery you can use Mederma and start massaging the scar. We ask you to wear your binder or sports bra for the first 6 weeks around the clock, including while sleeping. This provides added comfort and helps reduce the fluid accumulation at the surgery site.  ACTIVITY No heavy lifting until cleared by the doctor.  This usually means no more than a half-gallon of milk.  It is OK to walk and climb stairs. In fact, moving your legs is very important to decrease your risk of a blood clot.  It will also help keep you from getting deconditioned.  Every 1 to 2 hours get up and walk for 5 minutes. This will help with a quicker recovery back to normal.  Let pain be your guide so you don't do too much.  This is not the time for spring cleaning and don't plan on taking care of anyone else.  This time is for you to recover,  You will be more comfortable if you sleep and rest with your head elevated either with a few pillows under you or in a recliner.  No stomach sleeping for a three months.  WORK Everyone returns to work at different times.  As a rough guide, most people take at least 1 - 2 weeks off prior to returning to work. If you need documentation for your job, bring the forms to your postoperative follow up visit.  DRIVING Arrange for someone to bring you home from the hospital.  You may be able to drive a few days after surgery but not while taking any narcotics or valium.  BOWEL MOVEMENTS Constipation can occur after anesthesia and while taking pain medication.  It is important to stay ahead for your comfort.  We recommend taking Milk of Magnesia (2 tablespoons; twice a day) while taking the pain pills.  MEDICATIONS You may be prescribed should start after surgery At your preoperative visit for you  history and physical you may have been given the following medications: An antibiotic: Start this medication when you get home and take according to the instructions on the bottle. Zofran 4 mg:  This is to treat nausea and vomiting.  You can take this every 6 hours as needed and only if needed. Valium 2 mg: This is for muscle tightness if you have an implant or expander. This will help relax your muscle which also helps with pain control.  This can be taken every 12 hours as needed. Don't drive after taking this medication. Norco (hydrocodone/acetaminophen) 5/325 mg:  This is only to be used after you have taken the motrin or the tylenol. Every 8 hours as needed.   Over the counter Medication to take: Ibuprofen (Motrin) 600 mg:  Take this every 6 hours.  If you have additional pain then take 500 mg of the tylenol every 8 hours.  Only take the Norco after you have tried these two. Miralax or stool softener of choice: Take this according to the bottle if you take the [place] Call your surgeon's office if any of the following occur: Fever 101 degrees F or greater Excessive bleeding or fluid from the incision site. Pain that increases over time without aid from the medications Redness, warmth, or pus draining from incision sites Persistent nausea or inability to take in liquids Severe misshapen area that underwent the operation.  Here are some resources:  Plastic surgery website: https://www.plasticsurgery.org/for-medical-professionals/education-and-resources/publications/breast-reconstruction-magazine Breast Reconstruction Awareness Campaign:  [employer] Plastic surgery Implant information:  https://www.plasticsurgery.org/patient-safety/breast-implant-safety   Post Anesthesia Home Care Instructions  Activity: Get plenty of rest for the remainder of the day. A responsible individual must stay with you for 24 hours following the procedure.  For the next 24  hours, DO NOT: -Drive a car -Paediatric nurse -Drink alcoholic beverages -Take any medication unless instructed by your physician -Make any legal decisions or sign important papers.  Meals: Start with liquid foods such as gelatin or soup. Progress to regular foods as tolerated. Avoid greasy, spicy, heavy foods. If nausea and/or vomiting occur, drink only clear liquids until the nausea and/or vomiting subsides. Call your physician if vomiting continues.  Special Instructions/Symptoms: Your throat may feel dry or sore from the anesthesia or the breathing tube placed in your throat during surgery. If this causes discomfort, gargle with warm salt water. The discomfort should disappear within 24 hours.  If you had a scopolamine patch placed behind your ear for the management of post- operative nausea and/or vomiting:  1. The medication in the patch is effective for 72 hours, after which it should be removed.  Wrap patch in a tissue and discard in the trash. Wash hands thoroughly with soap and water. 2. You may remove the patch  earlier than 72 hours if you experience unpleasant side effects which may include dry mouth, dizziness or visual disturbances. 3. Avoid touching the patch. Wash your hands with soap and water after contact with the patch.      You had '15mg'$  of Toradol at 1010 today.  Do not take Ibuprofen/NSAIDS before 6:10pm today.

## 2021-12-06 ENCOUNTER — Encounter (HOSPITAL_BASED_OUTPATIENT_CLINIC_OR_DEPARTMENT_OTHER): Payer: Self-pay | Admitting: Plastic Surgery

## 2021-12-06 LAB — SURGICAL PATHOLOGY

## 2021-12-12 ENCOUNTER — Ambulatory Visit (INDEPENDENT_AMBULATORY_CARE_PROVIDER_SITE_OTHER): Payer: Medicare Other | Admitting: Surgical

## 2021-12-12 DIAGNOSIS — N62 Hypertrophy of breast: Secondary | ICD-10-CM

## 2021-12-12 NOTE — Progress Notes (Signed)
Patient is a 66 year old female here for follow-up after bilateral breast reduction with Dr. Marla Roe on 12/05/2021.  She is 1 week postop.  She had 288 g removed from the right breast and 288 g removed from the left breast.  She reports she is doing well, has some questions about her breast size -she reports she was interested in a C cup, she is unsure what cup size she is.  She has some questions about restrictions and activity.  Chaperone present on exam On exam bilateral NAC's are viable, bilateral breast dressings are in place, no drainage noted on dressings.  She does have some swelling of bilateral breasts as well as some resolving ecchymosis around bilateral NAC's.  I do think that there is some mild subcutaneous fluid collections of bilateral breast -no tension on the incisions.  We discussed ongoing restrictions of no lifting greater than 15 pounds, continue with compressive garment for 5 more weeks 24/7.  She can return to walking as her exercise.  We discussed good hydration.  All of her questions were answered to her content.  I do not see any signs of infection on exam.  We discussed continuing to monitor her bilateral breasts for increased swelling, may need needle aspiration if swelling does not improve over the next few weeks.  We will continue to monitor.  Recommend following up in 2 weeks for reevaluation.

## 2021-12-13 ENCOUNTER — Encounter: Payer: Medicare Other | Admitting: Physician Assistant

## 2021-12-13 ENCOUNTER — Encounter: Payer: Medicare Other | Admitting: Plastic Surgery

## 2021-12-19 ENCOUNTER — Telehealth: Payer: Self-pay

## 2021-12-19 NOTE — Telephone Encounter (Signed)
Called pt and informed her of Matt's recommendations. Pt conveyed understanding. Pt confirmed she feels well but was just concerned about the rash. Adv that there is a provider on call if any Sx worsen or develop and will be able to assist her. Pt understood.

## 2021-12-19 NOTE — Telephone Encounter (Signed)
Pt called c/o a rash on chest that has progressed to part of her breast. She stated she's had the rash but she's more red now. I adv pt to stop wearing the binder and wear a sports bra. Also adv pt that I would send a message to [facility] and would give her a call back with his recommendations. Pt conveyed understanding.   I adv pt to send pictures through MyChart to [facility] so that he could have an idea of what the rash looked like. Pt stated that she would.

## 2021-12-26 ENCOUNTER — Ambulatory Visit (INDEPENDENT_AMBULATORY_CARE_PROVIDER_SITE_OTHER): Payer: Medicare Other | Admitting: Surgical

## 2021-12-26 ENCOUNTER — Encounter: Payer: Self-pay | Admitting: Surgical

## 2021-12-26 DIAGNOSIS — M542 Cervicalgia: Secondary | ICD-10-CM

## 2021-12-26 DIAGNOSIS — L989 Disorder of the skin and subcutaneous tissue, unspecified: Secondary | ICD-10-CM

## 2021-12-26 DIAGNOSIS — N62 Hypertrophy of breast: Secondary | ICD-10-CM

## 2021-12-26 NOTE — Progress Notes (Signed)
Patient is a 66 year old female here for follow-up after bilateral breast reduction with Dr. Marla Roe on 12/05/2021.  She is 3 weeks postop.  She reports overall she is doing well, rash has improved.  She is not having any infectious symptoms.  She reports she feels as if some of the swelling has improved.    Chaperone present on exam On exam bilateral NAC's are viable, Steri-Strips were removed, she does have a wound at the T-junction of the left breast just inferior to the NAC.  There is no surrounding erythema or cellulitic changes.  Otherwise bilateral breast incisions are intact and healing well.  No significant subcutaneous fluid collections noted.  No erythema or cellulitic changes of the breast noted.  Continue with compressive garments, avoid strenuous activities or heavy lifting. Some of the Steri-Strips are removed, some of them were left in place due to well adherence.  She can remove these over the next few weeks as she is showering.  Recommend Vaseline and gauze to left breast wound.  Pictures were obtained of the patient and placed in the chart with the patient's or guardian's permission.

## 2021-12-27 ENCOUNTER — Encounter: Payer: Medicare Other | Admitting: Student

## 2021-12-27 ENCOUNTER — Telehealth: Payer: Self-pay

## 2021-12-27 NOTE — Telephone Encounter (Signed)
Pt left voicemail yesterday asking if she could start applying Moderma to her breast incisions. Per Catalina Antigua, adv pt to wait until next f/u on 9/6 with Lyndee Leo to be cleared as pt currently has a L breast wound. Pt conveyed understanding.

## 2022-01-07 ENCOUNTER — Encounter: Payer: Self-pay | Admitting: Plastic Surgery

## 2022-01-07 ENCOUNTER — Ambulatory Visit (INDEPENDENT_AMBULATORY_CARE_PROVIDER_SITE_OTHER): Payer: Medicare Other | Admitting: Plastic Surgery

## 2022-01-07 DIAGNOSIS — N62 Hypertrophy of breast: Secondary | ICD-10-CM

## 2022-01-07 NOTE — Progress Notes (Signed)
Patient ID: Stephanie Sanchez, female    DOB: [birth year], 66 y.o.   MRN: [id number]   Chief Complaint  Patient presents with   Post-op Follow-up   Breast Problem    The patient is a 66 year old female here for follow-up after undergoing a breast reduction on August 3.  She had almost 300 g removed from each breast.  She is very pleased with her results.  She had some rashes from the dressings.  I did not replace the Steri-Strips because of that.  No sign of seroma or hematoma.    Review of Systems  Constitutional:  Positive for activity change.  Eyes: Negative.   Respiratory: Negative.    Cardiovascular: Negative.   Gastrointestinal: Negative.   Endocrine: Negative.   Genitourinary: Negative.     Past Medical History:  Diagnosis Date   Arthritis    Coronary artery disease involving native coronary artery    cardiologist--- dr h. 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[place])    first dx 2013;  s/p  WLE and Vulvectomy's   PONV (postoperative nausea and vomiting)    Psoriasis    Scoliosis    cervicothoracic region    Past Surgical History:  Procedure Laterality Date   BLADDER SUSPENSION  2008   sling   BREAST EXCISIONAL BIOPSY Left    BREAST REDUCTION SURGERY Bilateral 12/05/2021   Procedure: MAMMARY REDUCTION  (BREAST);  Surgeon: Wallace Going, DO;  Location: [place];  Service: Plastics;  Laterality: Bilateral;  3 hours   BUNIONECTOMY  2008   CARDIOVASCULAR STRESS TEST  09-30-2013   dr Marlou Porch    normal nuclear study/  no ischemia/  normal LV function and wall motion,  ef 61%   CATARACT EXTRACTION W/ INTRAOCULAR LENS  IMPLANT, BILATERAL  2016   COLONOSCOPY WITH ESOPHAGOGASTRODUODENOSCOPY (EGD)  04-30-2020  [facility] [place]   GANGLION CYST EXCISION Left 06/20/2021   Procedure: Excision of left foot ganglion cyst;  Surgeon: Wylene Simmer, MD;  Location: [place];  Service: Orthopedics;  Laterality: Left;  20mn   [place] SURG[place] Right 11[place]02/12   [place] 11/[place]   Procedure: RADIOACTIVE SEED GUIDED EXCISIONAL RIGHT BREAST BIOPSY AND RIGHT AREOLA BIOPSY;  Surgeon: WRolm Bookbinder MD;  [place]  Service: General;  Laterality: Right;   TONSILLECTOMY  1973  age 66  VAGINAL HYSTERECTOMY  1988  age 66  VULVA [place] 08/[place]/2014   Procedure: VGeorgena Spurling  Surgeon: EEveritt Amber MD;  [facility]  Service: Gynecology;  Laterality: N/A;   VULVECTOMY N/A 08/29/2014   Procedure: WIDE LOCAL EXCISION OF VULVA;  Surgeon: EEveritt Amber MD;  [place]  Service: Gynecology;  Laterality: N/A;   VULVECTOMY N/A 05/04/2015   Procedure: WIDE LOCAL EXCISION LEFT  VULVAR  WITH BIOPSY OF RIGHT VULVA;  Surgeon: Everitt Amber, MD;  Location: [facility];  Service: Gynecology;  Laterality: N/A;   VULVECTOMY Left 01/22/2016   Procedure: PARTIAL SIMPLE VULVECTOMY;  Surgeon: Everitt Amber, MD;  Location: [facility];  Service: Gynecology;  Laterality: Left;   VULVECTOMY N/A 02/14/2016   Procedure: WIDE EXCISION VULVECTOMY;  Surgeon: Everitt Amber, MD;  Location: [facility];  Service: Gynecology;  Laterality: N/A;   VULVECTOMY N/A 05/09/2020   Procedure: WIDE LOCAL EXCISION VULVECTOMY;  Surgeon: Everitt Amber, MD;  Location: [facility];  Service: Gynecology;  Laterality: N/A;      Current  Outpatient Medications:    b complex vitamins capsule, Take 1 capsule by mouth daily., Disp: , Rfl:    Calcium Carbonate (CALCIUM 500 PO), Take by mouth in the morning and at bedtime., Disp: , Rfl:    Cholecalciferol (VITAMIN D) 2000 UNITS tablet, Take 2,000 Units by mouth daily., Disp: , Rfl:    Cyanocobalamin (VITAMIN B-12 PO), Take 1 tablet by mouth daily., Disp: , Rfl:    estradiol (ESTRACE VAGINAL) 0.1 MG/GM vaginal cream, Place 1 Applicatorful vaginally at bedtime., Disp: 42.5 g, Rfl: 12   estradiol (VIVELLE-DOT) 0.05 MG/24HR patch, Place 1 patch onto the skin 2 (two) times a week., Disp: , Rfl:    magnesium gluconate (MAGONATE) 500 MG tablet, Take 500 mg by mouth as needed (cramps)., Disp: , Rfl:    omega-3 acid ethyl esters (LOVAZA) 1 g capsule, Take 1 g by mouth daily., Disp: , Rfl:    OVER THE COUNTER MEDICATION, Take 1,000 mg by mouth daily. Ester C '1000mg'$ , Disp: , Rfl:    Probiotic Product (PROBIOTIC PO), Take by mouth., Disp: , Rfl:    rosuvastatin (CRESTOR) 10 MG tablet, Take 1 tablet (10 mg total) by mouth daily. (Patient taking differently: Take 10 mg by mouth at bedtime.), Disp: 90 tablet, Rfl: 3   Specialty Vitamins Products (COLLAGEN ULTRA) CAPS, Take by mouth daily., Disp: , Rfl:    telmisartan (MICARDIS) 40 MG tablet, Take 40 mg by mouth daily., Disp: , Rfl:    tretinoin (RETIN-A) 0.1 % cream, Apply topically at bedtime., Disp: , Rfl:    TURMERIC CURCUMIN PO, Take 1,600 mg by mouth 3 (three) times daily., Disp: , Rfl:    Ubiquinol 100 MG CAPS, Take by mouth 1 day or 1 dose., Disp: , Rfl:    zinc gluconate 50 MG tablet, Take 50 mg by mouth daily., Disp: , Rfl:    Objective:   There were no vitals filed for this visit.  Physical Exam Constitutional:      Appearance: Normal appearance.  Cardiovascular:     Rate and Rhythm: Normal rate.     Pulses: Normal pulses.  Pulmonary:     Effort: Pulmonary effort is normal.  Skin:    Capillary Refill: Capillary refill takes  less than 2 seconds.  Neurological:     Mental Status: She is alert and oriented to person, place, and time.  Psychiatric:        Mood and Affect: Mood normal.        Behavior: Behavior normal.        Thought Content: Thought content normal.        Judgment: Judgment normal.     Assessment & Plan:  Symptomatic mammary hypertrophy  Continue with sports bra increase activity slowly.  Increase lifting slowly.  Follow-up as needed  Pictures were obtained of the patient  and placed in the chart with the patient's or guardian's permission.   [place], DO

## 2022-01-08 ENCOUNTER — Encounter: Payer: Medicare Other | Admitting: Student

## 2022-01-10 ENCOUNTER — Encounter: Payer: Self-pay | Admitting: Plastic Surgery

## 2022-01-10 ENCOUNTER — Ambulatory Visit (INDEPENDENT_AMBULATORY_CARE_PROVIDER_SITE_OTHER): Payer: Medicare Other | Admitting: Plastic Surgery

## 2022-01-10 DIAGNOSIS — L989 Disorder of the skin and subcutaneous tissue, unspecified: Secondary | ICD-10-CM | POA: Insufficient documentation

## 2022-01-10 DIAGNOSIS — D485 Neoplasm of uncertain behavior of skin: Secondary | ICD-10-CM | POA: Diagnosis not present

## 2022-01-10 NOTE — Progress Notes (Signed)
Procedure Note  Preoperative Dx: Changing skin lesion right forehead  Postoperative Dx: Same  Procedure: Excision of changing skin lesion right forehead 1.5 cm  Anesthesia: Lidocaine 1% with 1:100,000 epinephrine  Indication for Procedure: Skin lesion  Description of Procedure: Risks and complications were explained to the patient.  Consent was confirmed and the patient understands the risks and benefits.  The potential complications and alternatives were explained and the patient consents.  The patient expressed understanding the option of not having the procedure and the risks of a scar.  Time out was called and all information was confirmed to be correct.    The area was prepped and drapped.  Lidocaine 1% with epinepherine was injected in the subcutaneous area.  After waiting several minutes for the local to take affect a #15 blade was used to excise the area in an eliptical pattern.  The skin edges were reapproximated with 5-0 Monocryl running closure.  A dressing was applied.  The patient was given instructions on how to care for the area and a follow up appointment.  [name] tolerated the procedure well and there were no complications. The patient did not want the specimen sent to pathology and I think that that would be fine.

## 2022-01-16 ENCOUNTER — Telehealth: Payer: Self-pay | Admitting: Student

## 2022-01-16 NOTE — Telephone Encounter (Signed)
Pt returned call to reschedule appt as requested by provider.  Rescheduled for Wednesday 01/22/22 at 9:30 with a different provider.

## 2022-01-22 ENCOUNTER — Ambulatory Visit (INDEPENDENT_AMBULATORY_CARE_PROVIDER_SITE_OTHER): Payer: Medicare Other | Admitting: Physician Assistant

## 2022-01-22 ENCOUNTER — Encounter: Payer: Self-pay | Admitting: Physician Assistant

## 2022-01-22 DIAGNOSIS — L989 Disorder of the skin and subcutaneous tissue, unspecified: Secondary | ICD-10-CM

## 2022-01-22 NOTE — Progress Notes (Signed)
Patient is a very pleasant 66 year old female with PMH of changing skin lesion right side of forehead s/p excision performed 01/10/2022 by Dr. Marla Roe who presents to clinic for suture removal.  Reviewed procedural report and the excision site was closed using 5-0 Monocryl.  She was also seen 3 days earlier for follow-up for her bilateral breast reduction surgery performed 12/05/2021 Dr. Marla Roe.  At that time, she expressed that she was quite pleased with her results.  She had been experiencing rashes consistent with tape dermatitis and so tapes and Steri-Strips were discontinued.  There is no evidence of seroma.  Plan was for continued activity modifications and compressive garments.  Today, patient is doing well.  No complaints.  She had an additional 2 lesions on the forehead frozen by her dermatologist after the excision.  She feels as though everything is healing fine.  She has been keeping it clean and covered with a Band-Aid.  Denies any complaints regarding her breast reduction surgery and instead states that she is quite pleased with the outcome.   Physical exam is reassuring.  No wound dehiscence.  Mild irritation, but no infection.  Difficult to appreciate Monocryl sutures from her hairline, but was able to remove a stitch.  No obvious residual sutures.  Recommending Vaseline as needed x1 week to help with the irritation after which she can begin applying her Skinuva scar gel twice daily.  Provided reassurance that even if a suture remains, they are absorbable and should fall off or dissolve on their own.  She can certainly return if there is anything bothersome.  Pathology was not sent for her specimen.  Picture(s) obtained of the patient and placed in the chart were with the patient's or guardian's permission.

## 2022-01-24 ENCOUNTER — Ambulatory Visit: Payer: Medicare Other | Admitting: Surgical

## 2022-01-24 ENCOUNTER — Ambulatory Visit: Payer: Medicare Other | Admitting: Student

## 2022-02-05 NOTE — Progress Notes (Unsigned)
Cardiology Office Note:    Date:  02/06/2022   ID:  Stephanie Sanchez, DOB 04/13/1956, MRN [id number]  PCP:  Crist Infante, MD  Cardiologist:  Sinclair Grooms, MD   Referring MD: Crist Infante, MD   Chief Complaint  Patient presents with   Coronary Artery Disease   Hypertension   Hyperlipidemia    History of Present Illness:    Stephanie Sanchez is a 66 y.o. female with a hx of chest pain (calcium score of 10, in 2020), hyperlipidemia, primary hypertension hypertension, headache, dyspnea, GERD, Paget's disease, switching for cardiology f/u from Dr. Marlou Porch. Low risk nuclear 2015    She is doing well and has no cardiac complaints.  She still walks up to 4 miles most days.  No exercise-induced discomfort or excessive shortness of breath.  She denies lower extremity swelling and orthopnea.  No significant palpitations or other complaints.  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  I did the best I could to explain it to her.  Past Medical History:  Diagnosis Date   Arthritis    Coronary artery disease involving native coronary artery    cardiologist--- dr h. 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[place])    first dx 2013;  s/p  WLE and Vulvectomy's   PONV (postoperative nausea and vomiting)    Psoriasis    Scoliosis    cervicothoracic region    Past Surgical History:  Procedure Laterality Date   BLADDER SUSPENSION  2008   sling   BREAST EXCISIONAL BIOPSY Left    BREAST REDUCTION SURGERY Bilateral 12/05/2021   Procedure: MAMMARY  REDUCTION  (BREAST);  Surgeon: Wallace Going, DO;  Location: [place];  Service: Plastics;  Laterality: Bilateral;  3 hours   BUNIONECTOMY  2008   CARDIOVASCULAR STRESS TEST  09-30-2013   dr Marlou Porch   normal nuclear study/  no ischemia/  normal LV function and wall motion,  ef 61%   CATARACT EXTRACTION W/ INTRAOCULAR LENS  IMPLANT, BILATERAL  2016   COLONOSCOPY WITH ESOPHAGOGASTRODUODENOSCOPY (EGD)  04-30-2020  [facility] [place]   GANGLION CYST EXCISION Left 06/20/2021   Procedure: Excision of left foot ganglion cyst;  Surgeon: Wylene Simmer, MD;  Location: [place];  Service: Orthopedics;  Laterality: Left;  37mn   HAND SURGERY Right 11/02/12   [place] 11/09[place]2018   Procedure: RADIOACTIVE SEED GUIDED EXCISIONAL RIGHT BREAST BIOPSY AND RIGHT AREOLA BIOPSY;  Surgeon: WRolm Bookbinder MD;  [place]  Service: General;  Laterality: Right;   TONSILLECTOMY  1973  age 66  VAGINAL HYSTERECTOMY  1988  age 66  [place]UL[place] 08[place]2014   Procedure: VGeorgena Spurling  Surgeon: EEveritt Amber MD;  [facility]  Service: Gynecology;  Laterality: N/A;   VULVECTOMY N/A 08/29/2014  Procedure: WIDE LOCAL EXCISION OF VULVA;  Surgeon: Everitt Amber, MD;  Location[facility];  Service: Gynecology;  Laterality: N/A;   VULVECTOMY N/A 05/04/2015   Procedure: WIDE LOCAL EXCISION LEFT  VULVAR WITH BIOPSY OF RIGHT VULVA;  Surgeon: Everitt Amber, MD;  Location: [place];  Service: Gynecology;  Laterality: N/A;   VULVECTOMY Left 01/22/2016   Procedure: PARTIAL SIMPLE VULVECTOMY;  Surgeon: Everitt Amber, MD;  Location: [facility];  Service: Gynecology;  Laterality: Left;   VULVECTOMY N/A 02/14/2016   Procedure: WIDE EXCISION VULVECTOMY;  Surgeon: Everitt Amber, MD;  Location[facility];  Service:  Gynecology;  Laterality: N/A;   VULVECTOMY N/A 05/09/2020   Procedure: WIDE LOCAL EXCISION VULVECTOMY;  Surgeon: Everitt Amber, MD;  Location: [facility];  Service: Gynecology;  Laterality: N/A;    Current Medications: Current Meds  Medication Sig   b complex vitamins capsule Take 1 capsule by mouth daily.   Calcium Carbonate (CALCIUM 500 PO) Take by mouth in the morning and at bedtime.   Cholecalciferol (VITAMIN D) 2000 UNITS tablet Take 2,000 Units by mouth daily.   Cyanocobalamin (VITAMIN B-12 PO) Take 1 tablet by mouth daily.   estradiol (ESTRACE VAGINAL) 0.1 MG/GM vaginal cream Place 1 Applicatorful vaginally at bedtime.   estradiol (VIVELLE-DOT) 0.05 MG/24HR patch Place 1 patch onto the skin 2 (two) times a week.   ezetimibe (ZETIA) 10 MG tablet Take 10 mg by mouth daily.   magnesium gluconate (MAGONATE) 500 MG tablet Take 500 mg by mouth as needed (cramps).   omega-3 acid ethyl esters (LOVAZA) 1 g capsule Take 1 g by mouth daily.   OVER THE COUNTER MEDICATION Take 1,000 mg by mouth daily. Ester C '1000mg'$    Probiotic Product (PROBIOTIC PO) Take by mouth.   rosuvastatin (CRESTOR) 10 MG tablet Take 1 tablet (10 mg total) by mouth daily. (Patient taking differently: Take 10 mg by mouth at bedtime.)   Specialty Vitamins Products (COLLAGEN ULTRA) CAPS Take by mouth daily.   telmisartan (MICARDIS) 40 MG tablet Take 40 mg by mouth daily.   tretinoin (RETIN-A) 0.1 % cream Apply topically at bedtime.   TURMERIC CURCUMIN PO Take 1,600 mg by mouth 3 (three) times daily.   Ubiquinol 100 MG CAPS Take by mouth 1 day or 1 dose.   zinc gluconate 50 MG tablet Take 50 mg by mouth daily.     Allergies:   Codeine, Aspirin-acetaminophen-caffeine, and Prednisone   Social History   Socioeconomic History   Marital status: Single    Spouse name: Not on file   Number of children: Not on file   Years of education: Not on file   Highest education level: Not on file  Occupational History    Not on file  Tobacco Use   Smoking status: Former    Packs/day: 1.00    Years: 5.00    Total pack years: 5.00    Types: Cigarettes    Quit date: 10/05/1978    Years since quitting: 43.3   Smokeless tobacco: Never  Vaping Use   Vaping Use: Never used  Substance and Sexual Activity   Alcohol use: Yes    Comment: occas   Drug use: No   Sexual activity: Not on file  Other Topics Concern   Not on file  Social History Narrative   ** Merged History Encounter **       Social Determinants of Health   Financial Resource Strain: Not on  file  Food Insecurity: Not on file  Transportation Needs: Not on file  Physical Activity: Not on file  Stress: Not on file  Social Connections: Not on file     Family History: The patient's family history includes AAA (abdominal aortic aneurysm) in her mother; Aortic aneurysm in her mother; Arrhythmia in her brother and brother; Breast cancer in her cousin; Diabetes in her father; Heart attack in her maternal grandfather; Other in her father; Thyroid cancer (age of onset: 44) in her daughter.  ROS:   Please see the history of present illness.    Anxiety is concerning her health/prefers preventative measures.  She is doing a great job of taking care of herself.  All other systems reviewed and are negative.  EKGs/Labs/Other Studies Reviewed:    The following studies were reviewed today: No new imaging  EKG:  EKG December 02, 2021 demonstrates incomplete right bundle, poor R wave progression V1 through V3.  Compared to 2022, the incomplete right bundle and decreased anterior forces is new.  Recent Labs: 06/17/2021: BUN 19; Creatinine, Ser 0.76; Potassium 5.6; Sodium 141  Recent Lipid Panel No results found for: "CHOL", "TRIG", "HDL", "CHOLHDL", "VLDL", "LDLCALC", "LDLDIRECT"  Physical Exam:    VS:  BP 138/86   Pulse 83   Ht '5\' 3"'$  (1.6 m)   Wt 120 lb (54.4 kg)   SpO2 96%   BMI 21.26 kg/m     Wt Readings from Last 3 Encounters:  02/06/22 120 lb  (54.4 kg)  01/10/22 120 lb (54.4 kg)  12/05/21 120 lb 2.4 oz (54.5 kg)  Repeat blood pressure 138/86 mmHg  GEN: Healthy appearing. No acute distress HEENT: Normal NECK: No JVD. LYMPHATICS: No lymphadenopathy CARDIAC: No murmur. RRR no gallop, or edema. VASCULAR:  Normal Pulses. No bruits. RESPIRATORY:  Clear to auscultation without rales, wheezing or rhonchi  ABDOMEN: Soft, non-tender, non-distended, No pulsatile mass, MUSCULOSKELETAL: No deformity  SKIN: Warm and dry NEUROLOGIC:  Alert and oriented x 3 PSYCHIATRIC:  Normal affect   ASSESSMENT:    1. Coronary artery disease involving native coronary artery of native heart without angina pectoris   2. Hyperlipidemia, unspecified hyperlipidemia type   3. Shortness of breath   4. Incomplete right bundle branch block    PLAN:    In order of problems listed above:  Low risk coronary calcium score 3 years ago.  She is treating lipid levels with 3 agents including omega-3, low-dose rosuvastatin, and ezetimibe.  No recent lipid panel.  A fasting liver and lipid panel will be performed within the next month with [place]. See above. Not a significant problem at this time.  If this becomes an issue, we should consider doing an echocardiogram. Noted.  Discussed.  Overall education and awareness concerning primary risk prevention was discussed in detail: LDL less than 70, hemoglobin A1c less than 7, blood pressure target less than 130/80 mmHg, >150 minutes of moderate aerobic activity per week, avoidance of smoking, weight control (via diet and exercise), and continued surveillance/management of/for obstructive sleep apnea.     Medication Adjustments/Labs and Tests Ordered: Current medicines are reviewed at length with the patient today.  Concerns regarding medicines are outlined above.  Orders Placed This Encounter  Procedures   Hepatic function panel   Lipid panel   No orders of the defined types were placed in this  encounter.   Patient Instructions  Medication Instructions:  Your physician recommends that you continue on your current medications as directed. Please refer to the  Current Medication list given to you today.  *If you need a refill on your cardiac medications before your next appointment, please call your pharmacy*  Lab Work: Fasting Liver, Lipid panel (please call to schedule lab appointment [phone]). If you have labs (blood work) drawn today and your tests are completely normal, you will receive your results only by: [place] (if you have MyChart) OR A paper copy in the mail If you have any lab test that is abnormal or we need to change your treatment, we will call you to review the results.  Testing/Procedures: NONE  Follow-Up: At [facility], you and your health needs are our priority.  As part of our continuing mission to provide you with exceptional heart care, we have created designated Provider Care Teams.  These Care Teams include your primary Cardiologist (physician) and Advanced Practice Providers (APPs -  Physician Assistants and Nurse Practitioners) who all work together to provide you with the care you need, when you need it.  Your next appointment:   1 year(s)  The format for your next appointment:   In Person  Provider:   Sinclair Grooms, MD   Important Information About Sugar         Signed, Sinclair Grooms, MD  02/06/2022 9:56 AM    [place]

## 2022-02-06 ENCOUNTER — Encounter: Payer: Self-pay | Admitting: Interventional Cardiology

## 2022-02-06 ENCOUNTER — Ambulatory Visit: Payer: Medicare Other | Attending: Interventional Cardiology | Admitting: Interventional Cardiology

## 2022-02-06 VITALS — BP 138/86 | HR 83 | Ht 63.0 in | Wt 120.0 lb

## 2022-02-06 DIAGNOSIS — E785 Hyperlipidemia, unspecified: Secondary | ICD-10-CM | POA: Diagnosis present

## 2022-02-06 DIAGNOSIS — I4510 Unspecified right bundle-branch block: Secondary | ICD-10-CM | POA: Diagnosis present

## 2022-02-06 DIAGNOSIS — R0602 Shortness of breath: Secondary | ICD-10-CM | POA: Diagnosis present

## 2022-02-06 DIAGNOSIS — I2510 Atherosclerotic heart disease of native coronary artery without angina pectoris: Secondary | ICD-10-CM | POA: Insufficient documentation

## 2022-02-06 NOTE — Patient Instructions (Signed)
Medication Instructions:  Your physician recommends that you continue on your current medications as directed. Please refer to the Current Medication list given to you today.  *If you need a refill on your cardiac medications before your next appointment, please call your pharmacy*  Lab Work: Fasting Liver, Lipid panel (please call to schedule lab appointment [phone]). If you have labs (blood work) drawn today and your tests are completely normal, you will receive your results only by: [place] (if you have MyChart) OR A paper copy in the mail If you have any lab test that is abnormal or we need to change your treatment, we will call you to review the results.  Testing/Procedures: NONE  Follow-Up: At [facility], you and your health needs are our priority.  As part of our continuing mission to provide you with exceptional heart care, we have created designated Provider Care Teams.  These Care Teams include your primary Cardiologist (physician) and Advanced Practice Providers (APPs -  Physician Assistants and Nurse Practitioners) who all work together to provide you with the care you need, when you need it.  Your next appointment:   1 year(s)  The format for your next appointment:   In Person  Provider:   Sinclair Grooms, MD   Important Information About Sugar

## 2022-02-07 ENCOUNTER — Other Ambulatory Visit: Payer: Self-pay | Admitting: Internal Medicine

## 2022-02-07 DIAGNOSIS — Z1231 Encounter for screening mammogram for malignant neoplasm of breast: Secondary | ICD-10-CM

## 2022-02-20 ENCOUNTER — Ambulatory Visit: Payer: Medicare Other | Attending: Interventional Cardiology

## 2022-02-20 DIAGNOSIS — E785 Hyperlipidemia, unspecified: Secondary | ICD-10-CM

## 2022-02-20 LAB — HEPATIC FUNCTION PANEL
ALT: 27 IU/L (ref 0–32)
AST: 26 IU/L (ref 0–40)
Albumin: 4.9 g/dL (ref 3.9–4.9)
Alkaline Phosphatase: 73 IU/L (ref 44–121)
Bilirubin Total: 0.8 mg/dL (ref 0.0–1.2)
Bilirubin, Direct: 0.23 mg/dL (ref 0.00–0.40)
Total Protein: 6.7 g/dL (ref 6.0–8.5)

## 2022-02-20 LAB — LIPID PANEL
Chol/HDL Ratio: 2.5 ratio (ref 0.0–4.4)
Cholesterol, Total: 136 mg/dL (ref 100–199)
HDL: 54 mg/dL (ref >39)
LDL Chol Calc (NIH): 69 mg/dL (ref 0–99)
Triglycerides: 61 mg/dL (ref 0–149)
VLDL Cholesterol Cal: 13 mg/dL (ref 5–40)

## 2022-04-21 ENCOUNTER — Ambulatory Visit: Payer: Medicare Other

## 2022-06-18 ENCOUNTER — Ambulatory Visit
Admission: RE | Admit: 2022-06-18 | Discharge: 2022-06-18 | Disposition: A | Discharge status: Home / Self Care | Payer: Medicare Other | Source: Ambulatory Visit | Attending: Internal Medicine | Admitting: Internal Medicine

## 2022-06-18 DIAGNOSIS — Z1231 Encounter for screening mammogram for malignant neoplasm of breast: Secondary | ICD-10-CM

## 2022-11-13 ENCOUNTER — Encounter (HOSPITAL_BASED_OUTPATIENT_CLINIC_OR_DEPARTMENT_OTHER): Payer: Self-pay | Admitting: Physical Therapy

## 2022-12-04 ENCOUNTER — Encounter (HOSPITAL_BASED_OUTPATIENT_CLINIC_OR_DEPARTMENT_OTHER): Payer: Self-pay | Admitting: Physical Therapy

## 2022-12-04 ENCOUNTER — Ambulatory Visit (HOSPITAL_BASED_OUTPATIENT_CLINIC_OR_DEPARTMENT_OTHER): Payer: Medicare Other | Attending: Internal Medicine | Admitting: Physical Therapy

## 2022-12-04 DIAGNOSIS — M25551 Pain in right hip: Secondary | ICD-10-CM | POA: Diagnosis present

## 2022-12-04 DIAGNOSIS — M6281 Muscle weakness (generalized): Secondary | ICD-10-CM | POA: Diagnosis present

## 2022-12-04 DIAGNOSIS — R293 Abnormal posture: Secondary | ICD-10-CM | POA: Insufficient documentation

## 2022-12-04 NOTE — Therapy (Signed)
OUTPATIENT PHYSICAL THERAPY EVALUATION   Patient Name: Stephanie Sanchez MRN: [id number] DOB:1955/11/27, 68 y.o., female Today's Date: 12/04/2022  END OF SESSION:  PT End of Session - 12/04/22 1257     Visit Number 1    Number of Visits 7    Date for PT Re-Evaluation 01/31/23    PT Start Time 1300    PT Stop Time 1345    PT Time Calculation (min) 45 min             Past Medical History:  Diagnosis Date   Arthritis    Coronary artery disease involving native coronary artery    cardiologist--- dr h. Katrinka Blazing--- CT coronary 04-21-2019 calcium score 10;  nuclear study 09-30-2013  normal w/ ef 61%   DDD (degenerative disc disease), lumbar    Esophagitis    EGD 04-30-2020   Gastritis    EGD 04-30-2020   GERD (gastroesophageal reflux disease)    History of cervical dysplasia    History of colon polyps    History of gastric ulcer    2013   History of squamous cell carcinoma excision    left elbow   Hyperlipemia    Hypertension    Lichen simplex chronicus    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HCC)    first dx 2013;  s/p  WLE and Vulvectomy's   PONV (postoperative nausea and vomiting)    Psoriasis    Scoliosis    cervicothoracic region   Past Surgical History:  Procedure Laterality Date   BLADDER SUSPENSION  2008   sling   BREAST EXCISIONAL BIOPSY Left    BREAST REDUCTION SURGERY Bilateral 12/05/2021   Procedure: MAMMARY REDUCTION  (BREAST);  Surgeon: Peggye Form, DO;  Location: [facility];  Service: Plastics;  Laterality: Bilateral;  3 hours   BUNIONECTOMY  2008   CARDIOVASCULAR STRESS TEST  09-30-2013   dr Anne Fu   normal nuclear study/  no ischemia/  normal LV function and wall motion,  ef 61%   CATARACT EXTRACTION W/ INTRAOCULAR LENS  IMPLANT, BILATERAL  2016   COLONOSCOPY WITH ESOPHAGOGASTRODUODENOSCOPY (EGD)  04-30-2020  [facility] [place]   GANGLION CYST EXCISION Left 06/20/2021   Procedure: Excision of left foot ganglion cyst;  Surgeon: Toni Arthurs,  MD;  Location: [facility];  Service: Orthopedics;  Laterality: Left;    HAND SURGERY Right 11/02/2012   hand reconstruction at [facility]   RADIOACTIVE SEED GUIDED EXCISIONAL BREAST BIOPSY Right 11/09/2018   Procedure: RADIOACTIVE SEED GUIDED EXCISIONAL RIGHT BREAST BIOPSY AND RIGHT AREOLA BIOPSY;  Surgeon: Emelia Loron, MD;  Location: [facility];  Service: General;  Laterality: Right;   REDUCTION MAMMAPLASTY     TONSILLECTOMY  1973  age 54   VAGINAL HYSTERECTOMY  1988  age 64   VULVA /PERINEUM BIOPSY N/A 08/29/2014   Procedure: Hart [place];  Surgeon: Adolphus Birchwood, MD;  Location: [facility];  Service: Gynecology;  Laterality: N/A;   VULVECTOMY N/A 08/29/2014   Procedure: WIDE LOCAL EXCISION OF VULVA;  Surgeon: Adolphus Birchwood, MD;  Location: [facility];  Service: Gynecology;  Laterality: N/A;   VULVECTOMY N/A 05/04/2015   Procedure: WIDE LOCAL EXCISION LEFT  VULVAR WITH BIOPSY OF RIGHT VULVA;  Surgeon: Adolphus Birchwood, MD;  Location: [facility];  Service: Gynecology;  Laterality: N/A;   VULVECTOMY Left 01/22/2016   Procedure: PARTIAL SIMPLE VULVECTOMY;  Surgeon: Adolphus Birchwood, MD;  Location: [facility];  Service:  Gynecology;  Laterality: Left;   VULVECTOMY N/A 02/14/2016   Procedure: WIDE EXCISION VULVECTOMY;  Surgeon: Adolphus Birchwood, MD;  Location: [facility];  Service: Gynecology;  Laterality: N/A;   VULVECTOMY N/A 05/09/2020   Procedure: WIDE LOCAL EXCISION VULVECTOMY;  Surgeon: Adolphus Birchwood, MD;  Location: [facility];  Service: Gynecology;  Laterality: N/A;   Patient Active Problem List   Diagnosis Date Noted   Changing skin lesion 01/10/2022   Urge incontinence 06/11/2020   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HCC) 05/09/2020   Encounter for counseling 03/18/2019   Symptomatic mammary hypertrophy 03/18/2019   Neck pain 03/18/2019   Back pain 03/18/2019   Vulval cellulitis  02/27/2016   Lichen simplex chronicus 09/26/2014   Chest pain 10/21/2013   Dyspnea 10/21/2013   GERD (gastroesophageal reflux disease) 10/21/2013   Idiopathic scoliosis 10/21/2013   Personal history of colonic polyps 10/21/2013   Genital atrophy of female 07/02/2012    REFERRING PROVIDER: Rodrigo Ran, MD   REFERRING DIAG: M41.9 (ICD-10-CM) - Scoliosis   Rationale for Evaluation and Treatment: Rehabilitation  THERAPY DIAG:  Abnormal posture  Muscle weakness (generalized)  Pain in right hip  ONSET DATE: chronic   SUBJECTIVE:                                                                                                                                                                                           SUBJECTIVE STATEMENT: Lost some of my upright posture. Had 2 surgeries since. Pain in right hip and right plantar fascia. Lt lower back hurts a lot when standing still. Marland Kitchen   PERTINENT HISTORY:  Bladder sling  PAIN:  Are you having pain? No  PRECAUTIONS:  None  RED FLAGS: None   WEIGHT BEARING RESTRICTIONS:  No  FALLS:  Has patient fallen in last 6 months? Yes. Number of falls 1  PATIENT GOALS:  Decrease pain, lifting, walking (walks between 4-5 miles regularly)   OBJECTIVE:   DIAGNOSTIC FINDINGS:  Xray in 2003: 25 deg centered at T-12-L1, 15 deg in thoracic labeled compensatory.    PATIENT SURVEYS:  FOTO 48  COGNITIVE STATUS: Within functional limits for tasks assessed   SENSATION: WFL  POSTURE:  Eval: thoraco dextroscoliosis, lumbar levoscoliosis, Rt ASIS elevation  HAND DOMINANCE:  Left  GAIT: Level of assistance: Complete Independence Comments: Rt trendelenburg   Body Part #1 Lumbar  PALPATION: EVAL: spasm in right hip abd recreated concordant pain  LE MMT LOWER EXTREMITY MMT:    MMT (lb) Right eval Left eval  Hip flexion    Hip extension    Hip abduction 25.7 27.3  Hip adduction  Hip internal rotation    Hip external  rotation    Knee flexion    Knee extension 41.6 40.3   (Blank rows = not tested)     TREATMENT:                                                                                                                               DATE: EVAL 12/04/22  Manual STM to hip abd Supine piriformis stretch Sidelying hip abd, circles    PATIENT EDUCATION:  Education details: Anatomy of condition, POC, HEP, exercise form/rationale Person educated: Patient Education method: Explanation, Demonstration, Tactile cues, Verbal cues, and Handouts Education comprehension: verbalized understanding, returned demonstration, verbal cues required, tactile cues required, and needs further education  HOME EXERCISE PROGRAM: [id number]   ASSESSMENT:  CLINICAL IMPRESSION: Patient is a 67 y.o. F who was seen today for physical therapy evaluation and treatment for return of Lt low back and right hip pain. Recently started experiencing Rt plantar fasciitis- encouraged rolling on frozen water bottle.  At this time, her strength is about equal bilaterally but is less than ideal for the demand of activity level she places in her body. Will plan to continue at 1/week to refresh on lumbopelvic stability program and improve endurance for functional activities and exercise.     REHAB POTENTIAL: Good  CLINICAL DECISION MAKING: Stable/uncomplicated  EVALUATION COMPLEXITY: Low   GOALS: Goals reviewed with patient? Yes   STG=LONG TERM GOALS: Target date: POC date  Meet FOTO goal Baseline:  Goal status: INITIAL  2.  Improve hip abd strength by at least 10% bilat Baseline:  Goal status: INITIAL  3.  Resolution of plantar fasciitis Baseline:  Goal status: INITIAL  4.  Independent in core stability exercises Baseline:  Goal status: INITIAL  5.  Walk her regular exercise without increased right hip pain Baseline:  Goal status: INITIAL     PLAN:  PT FREQUENCY: 1x/week  PT DURATION: 6 weeks  PLANNED  INTERVENTIONS: Therapeutic exercises, Therapeutic activity, Neuromuscular re-education, Balance training, Gait training, Patient/Family education, Self Care, Joint mobilization, Stair training, Aquatic Therapy, Dry Needling, Spinal mobilization, Cryotherapy, Moist heat, Taping, Traction, Ionotophoresis 4mg /ml Dexamethasone, Manual therapy, and Re-evaluation.  PLAN FOR NEXT SESSION: lumbopelvic stability, STM PRN  Belal Scallon C. Laiyla Slagel PT, DPT 12/04/22 4:58 PM

## 2022-12-23 ENCOUNTER — Encounter (HOSPITAL_BASED_OUTPATIENT_CLINIC_OR_DEPARTMENT_OTHER): Payer: Medicare Other | Admitting: Physical Therapy

## 2022-12-25 ENCOUNTER — Encounter (HOSPITAL_BASED_OUTPATIENT_CLINIC_OR_DEPARTMENT_OTHER): Payer: Self-pay | Admitting: Physical Therapy

## 2022-12-25 ENCOUNTER — Ambulatory Visit (HOSPITAL_BASED_OUTPATIENT_CLINIC_OR_DEPARTMENT_OTHER): Payer: Medicare Other | Admitting: Physical Therapy

## 2022-12-25 DIAGNOSIS — M6281 Muscle weakness (generalized): Secondary | ICD-10-CM

## 2022-12-25 DIAGNOSIS — R293 Abnormal posture: Secondary | ICD-10-CM

## 2022-12-25 NOTE — Therapy (Signed)
OUTPATIENT PHYSICAL THERAPY EVALUATION   Patient Name: Stephanie Sanchez MRN: [id number] DOB:1955-09-28, 67 y.o., female Today's Date: 12/25/2022  END OF SESSION:  PT End of Session - 12/25/22 1300     Visit Number 2    Number of Visits 7    Date for PT Re-Evaluation 01/31/23    PT Start Time 1300    PT Stop Time 1345    PT Time Calculation (min) 45 min             Past Medical History:  Diagnosis Date   Arthritis    Coronary artery disease involving native coronary artery    cardiologist--- dr h. Katrinka Blazing--- CT coronary 04-21-2019 calcium score 10;  nuclear study 09-30-2013  normal w/ ef 61%   DDD (degenerative disc disease), lumbar    Esophagitis    EGD 04-30-2020   Gastritis    EGD 04-30-2020   GERD (gastroesophageal reflux disease)    History of cervical dysplasia    History of colon polyps    History of gastric ulcer    2013   History of squamous cell carcinoma excision    left elbow   Hyperlipemia    Hypertension    Lichen simplex chronicus    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HCC)    first dx 2013;  s/p  WLE and Vulvectomy's   PONV (postoperative nausea and vomiting)    Psoriasis    Scoliosis    cervicothoracic region   Past Surgical History:  Procedure Laterality Date   BLADDER SUSPENSION  2008   sling   BREAST EXCISIONAL BIOPSY Left    BREAST REDUCTION SURGERY Bilateral 12/05/2021   Procedure: MAMMARY REDUCTION  (BREAST);  Surgeon: Peggye Form, DO;  Location: [facility];  Service: Plastics;  Laterality: Bilateral;  3 hours   BUNIONECTOMY  2008   CARDIOVASCULAR STRESS TEST  09-30-2013   dr Anne Fu   normal nuclear study/  no ischemia/  normal LV function and wall motion,  ef 61%   CATARACT EXTRACTION W/ INTRAOCULAR LENS  IMPLANT, BILATERAL  2016   COLONOSCOPY WITH ESOPHAGOGASTRODUODENOSCOPY (EGD)  04-30-2020  [facility] in [place]   GANGLION CYST EXCISION Left 06/20/2021   Procedure: Excision of left foot ganglion cyst;  Surgeon: Toni Arthurs,  MD;  Location: [facility];  Service: Orthopedics;  Laterality: Left;    HAND SURGERY Right 11/02/2012   hand reconstruction at [facility]   RADIOACTIVE SEED GUIDED EXCISIONAL BREAST BIOPSY Right 11/09/2018   Procedure: RADIOACTIVE SEED GUIDED EXCISIONAL RIGHT BREAST BIOPSY AND RIGHT AREOLA BIOPSY;  Surgeon: Emelia Loron, MD;  Location: [facility];  Service: General;  Laterality: Right;   REDUCTION MAMMAPLASTY     TONSILLECTOMY  1973  age 86   VAGINAL HYSTERECTOMY  1988  age 68   VULVA /PERINEUM BIOPSY N/A 08/29/2014   Procedure: Hart [place];  Surgeon: Adolphus Birchwood, MD;  Location: [facility];  Service: Gynecology;  Laterality: N/A;   VULVECTOMY N/A 08/29/2014   Procedure: WIDE LOCAL EXCISION OF VULVA;  Surgeon: Adolphus Birchwood, MD;  Location: [facility];  Service: Gynecology;  Laterality: N/A;   VULVECTOMY N/A 05/04/2015   Procedure: WIDE LOCAL EXCISION LEFT  VULVAR WITH BIOPSY OF RIGHT VULVA;  Surgeon: Adolphus Birchwood, MD;  Location: [facility] ;  Service: Gynecology;  Laterality: N/A;   VULVECTOMY Left 01/22/2016   Procedure: PARTIAL SIMPLE VULVECTOMY;  Surgeon: Adolphus Birchwood, MD;  Location: [facility];  Service:  Gynecology;  Laterality: Left;   VULVECTOMY N/A 02/14/2016   Procedure: WIDE EXCISION VULVECTOMY;  Surgeon: Adolphus Birchwood, MD;  Location: [facility];  Service: Gynecology;  Laterality: N/A;   VULVECTOMY N/A 05/09/2020   Procedure: WIDE LOCAL EXCISION VULVECTOMY;  Surgeon: Adolphus Birchwood, MD;  Location: [facility];  Service: Gynecology;  Laterality: N/A;   Patient Active Problem List   Diagnosis Date Noted   Changing skin lesion 01/10/2022   Urge incontinence 06/11/2020   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HCC) 05/09/2020   Encounter for counseling 03/18/2019   Symptomatic mammary hypertrophy 03/18/2019   Neck pain 03/18/2019   Back pain 03/18/2019   Vulval cellulitis  02/27/2016   Lichen simplex chronicus 09/26/2014   Chest pain 10/21/2013   Dyspnea 10/21/2013   GERD (gastroesophageal reflux disease) 10/21/2013   Idiopathic scoliosis 10/21/2013   Personal history of colonic polyps 10/21/2013   Genital atrophy of female 07/02/2012    REFERRING PROVIDER: Rodrigo Ran, MD   REFERRING DIAG: M41.9 (ICD-10-CM) - Scoliosis   Rationale for Evaluation and Treatment: Rehabilitation  THERAPY DIAG:  Abnormal posture  Muscle weakness (generalized)  ONSET DATE: chronic   SUBJECTIVE:                                                                                                                                                                                           SUBJECTIVE STATEMENT: It is already feeling a little better.   PERTINENT HISTORY:  Bladder sling  PAIN:  Are you having pain? No  PRECAUTIONS:  None  RED FLAGS: None   WEIGHT BEARING RESTRICTIONS:  No  FALLS:  Has patient fallen in last 6 months? Yes. Number of falls 1  PATIENT GOALS:  Decrease pain, lifting, walking (walks between 4-5 miles regularly)   OBJECTIVE:   DIAGNOSTIC FINDINGS:  Xray in 2003: 25 deg centered at T-12-L1, 15 deg in thoracic labeled compensatory.    PATIENT SURVEYS:  FOTO 48  COGNITIVE STATUS: Within functional limits for tasks assessed   SENSATION: WFL  POSTURE:  Eval: thoraco dextroscoliosis, lumbar levoscoliosis, Rt ASIS elevation  HAND DOMINANCE:  Left  GAIT: Level of assistance: Complete Independence Comments: Rt trendelenburg   Body Part #1 Lumbar  PALPATION: EVAL: spasm in right hip abd recreated concordant pain  LE MMT LOWER EXTREMITY MMT:    MMT (lb) Right eval Left eval  Hip flexion    Hip extension    Hip abduction 25.7 27.3  Hip adduction    Hip internal rotation    Hip external rotation    Knee flexion    Knee extension 41.6 40.3   (  Blank rows = not tested)     TREATMENT:                                                                                                                                Treatment                            8/22:  Pigeon pose Plank on elbows with alt hip ext, elbows on table Reviewed side plank, added clam Wall bridge, +march Supine SLR away from wall Triceps dip edge of table Triceps & lat pull red tband Biceps curls, curls+GHJ ER at 90 7lb each hand    DATE: EVAL 12/04/22  Manual STM to hip abd Supine piriformis stretch Sidelying hip abd, circles    PATIENT EDUCATION:  Education details: Anatomy of condition, POC, HEP, exercise form/rationale Person educated: Patient Education method: Explanation, Demonstration, Tactile cues, Verbal cues, and Handouts Education comprehension: verbalized understanding, returned demonstration, verbal cues required, tactile cues required, and needs further education  HOME EXERCISE PROGRAM: [id number]   ASSESSMENT:  CLINICAL IMPRESSION: Difficulty with endurance challenges to rt hip abductors in [place] due to activation of shortened QL. Able to tolerate progression of exercises for increased core demand and bilateral activation. Will f/u again on 9/3 to determine further POC.     REHAB POTENTIAL: Good  CLINICAL DECISION MAKING: Stable/uncomplicated  EVALUATION COMPLEXITY: Low   GOALS: Goals reviewed with patient? Yes   STG=LONG TERM GOALS: Target date: POC date  Meet FOTO goal Baseline:  Goal status: INITIAL  2.  Improve hip abd strength by at least 10% bilat Baseline:  Goal status: INITIAL  3.  Resolution of plantar fasciitis Baseline:  Goal status: INITIAL  4.  Independent in core stability exercises Baseline:  Goal status: INITIAL  5.  Walk her regular exercise without increased right hip pain Baseline:  Goal status: INITIAL     PLAN:  PT FREQUENCY: 1x/week  PT DURATION: 6 weeks  PLANNED INTERVENTIONS: Therapeutic exercises, Therapeutic activity, Neuromuscular re-education,  Balance training, Gait training, Patient/Family education, Self Care, Joint mobilization, Stair training, Aquatic Therapy, Dry Needling, Spinal mobilization, Cryotherapy, Moist heat, Taping, Traction, Ionotophoresis 4mg /ml Dexamethasone, Manual therapy, and Re-evaluation.  PLAN FOR NEXT SESSION: lumbopelvic stability, STM PRN  Lois Ostrom C. Rutha Melgoza PT, DPT 12/25/22 7:32 PM

## 2022-12-30 ENCOUNTER — Ambulatory Visit (HOSPITAL_BASED_OUTPATIENT_CLINIC_OR_DEPARTMENT_OTHER): Payer: Medicare Other | Admitting: Physical Therapy

## 2023-01-06 ENCOUNTER — Ambulatory Visit (HOSPITAL_BASED_OUTPATIENT_CLINIC_OR_DEPARTMENT_OTHER): Payer: Medicare Other | Attending: Internal Medicine | Admitting: Physical Therapy

## 2023-01-06 ENCOUNTER — Encounter (HOSPITAL_BASED_OUTPATIENT_CLINIC_OR_DEPARTMENT_OTHER): Payer: Self-pay | Admitting: Physical Therapy

## 2023-01-06 DIAGNOSIS — M6281 Muscle weakness (generalized): Secondary | ICD-10-CM

## 2023-01-06 DIAGNOSIS — R293 Abnormal posture: Secondary | ICD-10-CM | POA: Diagnosis present

## 2023-01-06 NOTE — Therapy (Signed)
OUTPATIENT PHYSICAL THERAPY EVALUATION   Patient Name: Stephanie Sanchez MRN: [id number] DOB:[birth year], 67 y.o., female Today's Date: 01/06/2023  END OF SESSION:  PT End of Session - 01/06/23 1107     Visit Number 3    Number of Visits 7    Date for PT Re-Evaluation 01/31/23    PT Start Time 1105    PT Stop Time 1144    PT Time Calculation (min) 39 min    Activity Tolerance Patient tolerated treatment well    Behavior During Therapy [facility] for tasks assessed/performed             Past Medical History:  Diagnosis Date   Arthritis    Coronary artery disease involving native coronary artery    cardiologist--- dr h. Katrinka Blazing--- CT coronary 04-21-2019 calcium score 10;  nuclear study 09-30-2013  normal w/ ef 61%   DDD (degenerative disc disease), lumbar    Esophagitis    EGD 04-30-2020   Gastritis    EGD 04-30-2020   GERD (gastroesophageal reflux disease)    History of cervical dysplasia    History of colon polyps    History of gastric ulcer    2013   History of squamous cell carcinoma excision    left elbow   Hyperlipemia    Hypertension    Lichen simplex chronicus    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HCC)    first dx 2013;  s/p  WLE and Vulvectomy's   PONV (postoperative nausea and vomiting)    Psoriasis    Scoliosis    cervicothoracic region   Past Surgical History:  Procedure Laterality Date   BLADDER SUSPENSION  2008   sling   BREAST EXCISIONAL BIOPSY Left    BREAST REDUCTION SURGERY Bilateral 12/05/2021   Procedure: MAMMARY REDUCTION  (BREAST);  Surgeon: Peggye Form, DO;  Location: [facility];  Service: Plastics;  Laterality: Bilateral;  3 hours   BUNIONECTOMY  2008   CARDIOVASCULAR STRESS TEST  09-30-2013   dr Anne Fu   normal nuclear study/  no ischemia/  normal LV function and wall motion,  ef 61%   CATARACT EXTRACTION W/ INTRAOCULAR LENS  IMPLANT, BILATERAL  2016   COLONOSCOPY WITH ESOPHAGOGASTRODUODENOSCOPY (EGD)  04-30-2020  [facility] [place]    GANGLION CYST EXCISION Left 06/20/2021   Procedure: Excision of left foot ganglion cyst;  Surgeon: Toni Arthurs, MD;  Location: [facility];  Service: Orthopedics;  Laterality: Left;    HAND SURGERY Right 11/02/2012   hand reconstruction at [facility]   RADIOACTIVE SEED GUIDED EXCISIONAL BREAST BIOPSY Right 11/09/2018   Procedure: RADIOACTIVE SEED GUIDED EXCISIONAL RIGHT BREAST BIOPSY AND RIGHT AREOLA BIOPSY;  Surgeon: Emelia Loron, MD;  Location: [facility];  Service: General;  Laterality: Right;   REDUCTION MAMMAPLASTY     TONSILLECTOMY  1973  age 21   VAGINAL HYSTERECTOMY  1988  age 60   VULVA /PERINEUM BIOPSY N/A 08/29/2014   Procedure: Hart [place];  Surgeon: Adolphus Birchwood, MD;  Location: [facility];  Service: Gynecology;  Laterality: N/A;   VULVECTOMY N/A 08/29/2014   Procedure: WIDE LOCAL EXCISION OF VULVA;  Surgeon: Adolphus Birchwood, MD;  Location: [facility];  Service: Gynecology;  Laterality: N/A;   VULVECTOMY N/A 05/04/2015   Procedure: WIDE LOCAL EXCISION LEFT  VULVAR WITH BIOPSY OF RIGHT VULVA;  Surgeon: Adolphus Birchwood, MD;  Location: [facility];  Service: Gynecology;  Laterality: N/A;   VULVECTOMY Left 01/22/2016  Procedure: PARTIAL SIMPLE VULVECTOMY;  Surgeon: Adolphus Birchwood, MD;  Location: [facility];  Service: Gynecology;  Laterality: Left;   VULVECTOMY N/A 02/14/2016   Procedure: WIDE EXCISION VULVECTOMY;  Surgeon: Adolphus Birchwood, MD;  Location: [facility];  Service: Gynecology;  Laterality: N/A;   VULVECTOMY N/A 05/09/2020   Procedure: WIDE LOCAL EXCISION VULVECTOMY;  Surgeon: Adolphus Birchwood, MD;  Location: [facility];  Service: Gynecology;  Laterality: N/A;   Patient Active Problem List   Diagnosis Date Noted   Changing skin lesion 01/10/2022   Urge incontinence 06/11/2020   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HCC) 05/09/2020   Encounter for counseling 03/18/2019    Symptomatic mammary hypertrophy 03/18/2019   Neck pain 03/18/2019   Back pain 03/18/2019   Vulval cellulitis 02/27/2016   Lichen simplex chronicus 09/26/2014   Chest pain 10/21/2013   Dyspnea 10/21/2013   GERD (gastroesophageal reflux disease) 10/21/2013   Idiopathic scoliosis 10/21/2013   Personal history of colonic polyps 10/21/2013   Genital atrophy of female 07/02/2012    REFERRING PROVIDER: Rodrigo Ran, MD   REFERRING DIAG: M41.9 (ICD-10-CM) - Scoliosis   Rationale for Evaluation and Treatment: Rehabilitation  THERAPY DIAG:  Abnormal posture  Muscle weakness (generalized)  ONSET DATE: chronic   SUBJECTIVE:                                                                                                                                                                                           SUBJECTIVE STATEMENT: Want to practice with my setup at home- how to attach to doors. I had 3 massages last week.   PERTINENT HISTORY:  Bladder sling  PAIN:  Are you having pain? No  PRECAUTIONS:  None  RED FLAGS: None   WEIGHT BEARING RESTRICTIONS:  No  FALLS:  Has patient fallen in last 6 months? Yes. Number of falls 1  PATIENT GOALS:  Decrease pain, lifting, walking (walks between 4-5 miles regularly)   OBJECTIVE:   DIAGNOSTIC FINDINGS:  Xray in 2003: 25 deg centered at T-12-L1, 15 deg in thoracic labeled compensatory.    PATIENT SURVEYS:  FOTO 48  COGNITIVE STATUS: Within functional limits for tasks assessed   SENSATION: WFL  POSTURE:  Eval: thoraco dextroscoliosis, lumbar levoscoliosis, Rt ASIS elevation  HAND DOMINANCE:  Left  GAIT: Level of assistance: Complete Independence Comments: Rt trendelenburg   Body Part #1 Lumbar  PALPATION: EVAL: spasm in right hip abd recreated concordant pain  LE MMT LOWER EXTREMITY MMT:    MMT (lb) Right eval Left eval  Hip flexion    Hip extension    Hip abduction 25.7 27.3  Hip adduction    Hip  internal rotation    Hip external rotation    Knee flexion    Knee extension 41.6 40.3   (Blank rows = not tested)     TREATMENT:                                                                                                                               Treatment                            9/3:  Full review of HEP list Deep squat to floor<>stand    Treatment                            8/22:  Pigeon pose Plank on elbows with alt hip ext, elbows on table Reviewed side plank, added clam Wall bridge, +march Supine SLR away from wall Triceps dip edge of table Triceps & lat pull red tband Biceps curls, curls+GHJ ER at 90 7lb each hand    DATE: EVAL 12/04/22  Manual STM to hip abd Supine piriformis stretch Sidelying hip abd, circles    PATIENT EDUCATION:  Education details: Anatomy of condition, POC, HEP, exercise form/rationale Person educated: Patient Education method: Explanation, Demonstration, Tactile cues, Verbal cues, and Handouts Education comprehension: verbalized understanding, returned demonstration, verbal cues required, tactile cues required, and needs further education  HOME EXERCISE PROGRAM: [id number]   ASSESSMENT:  CLINICAL IMPRESSION: Cuing required for form today and will continue to work on these progressions/changes until next f/u. Discussed mportance of mantenance of length/tension in musculature around curvatures.     REHAB POTENTIAL: Good  CLINICAL DECISION MAKING: Stable/uncomplicated  EVALUATION COMPLEXITY: Low   GOALS: Goals reviewed with patient? Yes   STG=LONG TERM GOALS: Target date: POC date  Meet FOTO goal Baseline:  Goal status: INITIAL  2.  Improve hip abd strength by at least 10% bilat Baseline:  Goal status: INITIAL  3.  Resolution of plantar fasciitis Baseline:  Goal status: INITIAL  4.  Independent in core stability exercises Baseline:  Goal status: INITIAL  5.  Walk her regular exercise without increased  right hip pain Baseline:  Goal status: INITIAL     PLAN:  PT FREQUENCY: 1x/week  PT DURATION: 6 weeks  PLANNED INTERVENTIONS: Therapeutic exercises, Therapeutic activity, Neuromuscular re-education, Balance training, Gait training, Patient/Family education, Self Care, Joint mobilization, Stair training, Aquatic Therapy, Dry Needling, Spinal mobilization, Cryotherapy, Moist heat, Taping, Traction, Ionotophoresis 4mg /ml Dexamethasone, Manual therapy, and Re-evaluation.  PLAN FOR NEXT SESSION: lumbopelvic stability, STM PRN  Mckenlee Mangham C. Urho Rio PT, DPT 01/06/23 11:45 AM

## 2023-01-12 ENCOUNTER — Ambulatory Visit (HOSPITAL_BASED_OUTPATIENT_CLINIC_OR_DEPARTMENT_OTHER): Payer: Medicare Other | Admitting: Physical Therapy

## 2023-01-20 ENCOUNTER — Ambulatory Visit (HOSPITAL_BASED_OUTPATIENT_CLINIC_OR_DEPARTMENT_OTHER): Payer: Medicare Other | Admitting: Physical Therapy

## 2023-01-20 ENCOUNTER — Encounter (HOSPITAL_BASED_OUTPATIENT_CLINIC_OR_DEPARTMENT_OTHER): Payer: Self-pay | Admitting: Physical Therapy

## 2023-01-20 DIAGNOSIS — M6281 Muscle weakness (generalized): Secondary | ICD-10-CM

## 2023-01-20 DIAGNOSIS — R293 Abnormal posture: Secondary | ICD-10-CM

## 2023-01-20 NOTE — Therapy (Signed)
OUTPATIENT PHYSICAL THERAPY EVALUATION   Patient Name: Stephanie Sanchez MRN: [id number] DOB:06/11/1955, 67 y.o., female Today's Date: 01/20/2023  END OF SESSION:  PT End of Session - 01/20/23 1058     Visit Number 4    Number of Visits 7    Date for PT Re-Evaluation 02/23/23    PT Start Time 1056    PT Stop Time 1134    PT Time Calculation (min) 38 min    Activity Tolerance Patient tolerated treatment well    Behavior During Therapy [facility] for tasks assessed/performed              Past Medical History:  Diagnosis Date   Arthritis    Coronary artery disease involving native coronary artery    cardiologist--- dr h. Katrinka Blazing--- CT coronary 04-21-2019 calcium score 10;  nuclear study 09-30-2013  normal w/ ef 61%   DDD (degenerative disc disease), lumbar    Esophagitis    EGD 04-30-2020   Gastritis    EGD 04-30-2020   GERD (gastroesophageal reflux disease)    History of cervical dysplasia    History of colon polyps    History of gastric ulcer    2013   History of squamous cell carcinoma excision    left elbow   Hyperlipemia    Hypertension    Lichen simplex chronicus    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HCC)    first dx 2013;  s/p  WLE and Vulvectomy's   PONV (postoperative nausea and vomiting)    Psoriasis    Scoliosis    cervicothoracic region   Past Surgical History:  Procedure Laterality Date   BLADDER SUSPENSION  2008   sling   BREAST EXCISIONAL BIOPSY Left    BREAST REDUCTION SURGERY Bilateral 12/05/2021   Procedure: MAMMARY REDUCTION  (BREAST);  Surgeon: Peggye Form, DO;  Location: [facility];  Service: Plastics;  Laterality: Bilateral;  3 hours   BUNIONECTOMY  2008   CARDIOVASCULAR STRESS TEST  09-30-2013   dr Anne Fu   normal nuclear study/  no ischemia/  normal LV function and wall motion,  ef 61%   CATARACT EXTRACTION W/ INTRAOCULAR LENS  IMPLANT, BILATERAL  2016   COLONOSCOPY WITH ESOPHAGOGASTRODUODENOSCOPY (EGD)  04-30-2020  [facility] in  [place]   GANGLION CYST EXCISION Left 06/20/2021   Procedure: Excision of left foot ganglion cyst;  Surgeon: Toni Arthurs, MD;  Location: [facility];  Service: Orthopedics;  Laterality: Left;    HAND SURGERY Right 11/02/2012   hand reconstruction at [facility]   RADIOACTIVE SEED GUIDED EXCISIONAL BREAST BIOPSY Right 11/09/2018   Procedure: RADIOACTIVE SEED GUIDED EXCISIONAL RIGHT BREAST BIOPSY AND RIGHT AREOLA BIOPSY;  Surgeon: Emelia Loron, MD;  Location: [facility];  Service: General;  Laterality: Right;   REDUCTION MAMMAPLASTY     TONSILLECTOMY  1973  age 56   VAGINAL HYSTERECTOMY  1988  age 67   VULVA /PERINEUM BIOPSY N/A 08/29/2014   Procedure: Hart [place];  Surgeon: Adolphus Birchwood, MD;  Location: [facility];  Service: Gynecology;  Laterality: N/A;   VULVECTOMY N/A 08/29/2014   Procedure: WIDE LOCAL EXCISION OF VULVA;  Surgeon: Adolphus Birchwood, MD;  Location: [facility];  Service: Gynecology;  Laterality: N/A;   VULVECTOMY N/A 05/04/2015   Procedure: WIDE LOCAL EXCISION LEFT  VULVAR WITH BIOPSY OF RIGHT VULVA;  Surgeon: Adolphus Birchwood, MD;  Location: [facility] ;  Service: Gynecology;  Laterality: N/A;   VULVECTOMY Left  01/22/2016   Procedure: PARTIAL SIMPLE VULVECTOMY;  Surgeon: Adolphus Birchwood, MD;  Location: [facility];  Service: Gynecology;  Laterality: Left;   VULVECTOMY N/A 02/14/2016   Procedure: WIDE EXCISION VULVECTOMY;  Surgeon: Adolphus Birchwood, MD;  Location: [facility];  Service: Gynecology;  Laterality: N/A;   VULVECTOMY N/A 05/09/2020   Procedure: WIDE LOCAL EXCISION VULVECTOMY;  Surgeon: Adolphus Birchwood, MD;  Location: [facility];  Service: Gynecology;  Laterality: N/A;   Patient Active Problem List   Diagnosis Date Noted   Changing skin lesion 01/10/2022   Urge incontinence 06/11/2020   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HCC) 05/09/2020   Encounter for counseling 03/18/2019    Symptomatic mammary hypertrophy 03/18/2019   Neck pain 03/18/2019   Back pain 03/18/2019   Vulval cellulitis 02/27/2016   Lichen simplex chronicus 09/26/2014   Chest pain 10/21/2013   Dyspnea 10/21/2013   GERD (gastroesophageal reflux disease) 10/21/2013   Idiopathic scoliosis 10/21/2013   Personal history of colonic polyps 10/21/2013   Genital atrophy of female 07/02/2012    REFERRING PROVIDER: Rodrigo Ran, MD   REFERRING DIAG: M41.9 (ICD-10-CM) - Scoliosis   Rationale for Evaluation and Treatment: Rehabilitation  THERAPY DIAG:  Abnormal posture  Muscle weakness (generalized)  ONSET DATE: chronic   SUBJECTIVE:                                                                                                                                                                                           SUBJECTIVE STATEMENT: Planning to join the gym- would like to know what to do as far as machines.   PERTINENT HISTORY:  Bladder sling  PAIN:  Are you having pain? No  PRECAUTIONS:  None  RED FLAGS: None   WEIGHT BEARING RESTRICTIONS:  No  FALLS:  Has patient fallen in last 6 months? Yes. Number of falls 1  PATIENT GOALS:  Decrease pain, lifting, walking (walks between 4-5 miles regularly)   OBJECTIVE:   DIAGNOSTIC FINDINGS:  Xray in 2003: 25 deg centered at T-12-L1, 15 deg in thoracic labeled compensatory.    PATIENT SURVEYS:  FOTO 48  COGNITIVE STATUS: Within functional limits for tasks assessed   SENSATION: WFL  POSTURE:  Eval: thoraco dextroscoliosis, lumbar levoscoliosis, Rt ASIS elevation  HAND DOMINANCE:  Left  GAIT: Level of assistance: Complete Independence Comments: Rt trendelenburg   Body Part #1 Lumbar  PALPATION: EVAL: spasm in right hip abd recreated concordant pain  LE MMT LOWER EXTREMITY MMT:    MMT (lb) Right eval Left eval  Hip flexion    Hip extension    Hip abduction 25.7 27.3  Hip adduction    Hip internal  rotation    Hip external rotation    Knee flexion    Knee extension 41.6 40.3   (Blank rows = not tested)     TREATMENT:                                                                                                                               Treatment                            9/17:  Leg curl (35lb) Leg extension(40lb) Leg press (70 lb) Lat pull down cable (20lb) Row-cable (20lb) Adjustable cable tower (10lb)- biceps curl, triceps press   Treatment                            9/3:  Full review of HEP list Deep squat to floor<>stand    Treatment                            8/22:  Pigeon pose Plank on elbows with alt hip ext, elbows on table Reviewed side plank, added clam Wall bridge, +march Supine SLR away from wall Triceps dip edge of table Triceps & lat pull red tband Biceps curls, curls+GHJ ER at 90 7lb each hand    PATIENT EDUCATION:  Education details: Teacher, music of condition, POC, HEP, exercise form/rationale Person educated: Patient Education method: Explanation, Demonstration, Tactile cues, Verbal cues, and Handouts Education comprehension: verbalized understanding, returned demonstration, verbal cues required, tactile cues required, and needs further education  HOME EXERCISE PROGRAM: [id number]   ASSESSMENT:  CLINICAL IMPRESSION: Educated in [employer] of gym program to perform at the [facility]. Will f/u in 1 month to check form and make progressions PRN.    REHAB POTENTIAL: Good  CLINICAL DECISION MAKING: Stable/uncomplicated  EVALUATION COMPLEXITY: Low   GOALS: Goals reviewed with patient? Yes   STG=LONG TERM GOALS: Target date: POC date  Meet FOTO goal Baseline:  Goal status: INITIAL  2.  Improve hip abd strength by at least 10% bilat Baseline:  Goal status: INITIAL  3.  Resolution of plantar fasciitis Baseline:  Goal status: INITIAL  4.  Independent in core stability exercises Baseline:  Goal status: INITIAL  5.  Walk her  regular exercise without increased right hip pain Baseline:  Goal status: INITIAL     PLAN:  PT FREQUENCY: 1x/week  PT DURATION: 6 weeks  PLANNED INTERVENTIONS: Therapeutic exercises, Therapeutic activity, Neuromuscular re-education, Balance training, Gait training, Patient/Family education, Self Care, Joint mobilization, Stair training, Aquatic Therapy, Dry Needling, Spinal mobilization, Cryotherapy, Moist heat, Taping, Traction, Ionotophoresis 4mg /ml Dexamethasone, Manual therapy, and Re-evaluation.  PLAN FOR NEXT SESSION: lumbopelvic stability, STM PRN  Issabelle Mcraney C. Jaziya Obarr PT, DPT 01/20/23 11:41 AM

## 2023-02-11 DIAGNOSIS — I10 Essential (primary) hypertension: Secondary | ICD-10-CM | POA: Insufficient documentation

## 2023-02-11 DIAGNOSIS — E785 Hyperlipidemia, unspecified: Secondary | ICD-10-CM | POA: Insufficient documentation

## 2023-02-11 NOTE — Progress Notes (Unsigned)
Cardiology Office Note:   Date:  02/11/2023  ID:  Stephanie Sanchez, DOB [birth year], MRN [id number] PCP: Rodrigo Ran, MD  [facility] Providers Cardiologist:  Lesleigh Noe, MD (Inactive) {  History of Present Illness:   Stephanie Sanchez is a 67 y.o. female with a hx of chest pain (calcium score of 10, in 2020), hyperlipidemia, primary hypertension hypertension, headache, dyspnea, GERD, Paget's disease, switching for cardiology f/u from Dr. Anne Fu. Low risk nuclear 2015   She was seeing Dr. Katrinka Blazing.  ***   *** She is doing well and has no cardiac complaints.  She still walks up to 4 miles most days.  No exercise-induced discomfort or excessive shortness of breath.  She denies lower extremity swelling and orthopnea.  No significant palpitations or other complaints.  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  I did the best I could to explain it to her.    ROS: ***  Studies Reviewed:    EKG:       ***  Risk Assessment/Calculations:   {Does this patient have ATRIAL FIBRILLATION?:[phone]} No BP recorded.  {Refresh Note OR Click here to enter BP  :1}***        Physical Exam:   VS:  There were no vitals taken for this visit.   Wt Readings from Last 3 Encounters:  02/06/22 120 lb (54.4 kg)  01/10/22 120 lb (54.4 kg)  12/05/21 120 lb 2.4 oz (54.5 kg)     GEN: Well nourished, well developed in no acute distress NECK: No JVD; No carotid bruits CARDIAC: ***RRR, no murmurs, rubs, gallops RESPIRATORY:  Clear to auscultation without rales, wheezing or rhonchi  ABDOMEN: Soft, non-tender, non-distended EXTREMITIES:  No edema; No deformity   ASSESSMENT AND PLAN:   Elevated calcium score:  *** Low risk coronary calcium score 3 years ago.  She is treating lipid levels with 3 agents including omega-3, low-dose rosuvastatin, and ezetimibe.  No recent lipid panel.  A fasting liver and lipid panel will be performed within the next month with [place].  HTN:  ***  Dyslipidemia:  ***       {Are you ordering a CV Procedure (e.g. stress test, cath, DCCV, TEE, etc)?   Press F2        :[id number]}  Follow up ***  Signed, Rollene Rotunda, MD

## 2023-02-12 ENCOUNTER — Encounter: Payer: Self-pay | Admitting: Cardiology

## 2023-02-12 ENCOUNTER — Ambulatory Visit: Payer: Medicare Other | Attending: Cardiology | Admitting: Cardiology

## 2023-02-12 VITALS — BP 112/78 | HR 82 | Ht 63.0 in | Wt 121.4 lb

## 2023-02-12 DIAGNOSIS — R931 Abnormal findings on diagnostic imaging of heart and coronary circulation: Secondary | ICD-10-CM | POA: Diagnosis not present

## 2023-02-12 DIAGNOSIS — I10 Essential (primary) hypertension: Secondary | ICD-10-CM | POA: Diagnosis present

## 2023-02-12 DIAGNOSIS — E785 Hyperlipidemia, unspecified: Secondary | ICD-10-CM | POA: Insufficient documentation

## 2023-02-12 NOTE — Patient Instructions (Signed)
    Follow-Up: At [facility], you and your health needs are our priority.  As part of our continuing mission to provide you with exceptional heart care, we have created designated Provider Care Teams.  These Care Teams include your primary Cardiologist (physician) and Advanced Practice Providers (APPs -  Physician Assistants and Nurse Practitioners) who all work together to provide you with the care you need, when you need it.  We recommend signing up for the patient portal called "MyChart".  Sign up information is provided on this After Visit Summary.  MyChart is used to connect with patients for Virtual Visits (Telemedicine).  Patients are able to view lab/test results, encounter notes, upcoming appointments, etc.  Non-urgent messages can be sent to your provider as well.   To learn more about what you can do with MyChart, go to [email or web address].    Your next appointment:   12 month(s)  Provider:   James Hochrein MD   

## 2023-02-16 ENCOUNTER — Encounter: Payer: Self-pay | Admitting: Plastic Surgery

## 2023-02-16 ENCOUNTER — Ambulatory Visit (INDEPENDENT_AMBULATORY_CARE_PROVIDER_SITE_OTHER): Payer: Medicare Other | Admitting: Plastic Surgery

## 2023-02-16 VITALS — BP 127/78 | HR 92 | Ht 62.0 in | Wt 120.6 lb

## 2023-02-16 DIAGNOSIS — L989 Disorder of the skin and subcutaneous tissue, unspecified: Secondary | ICD-10-CM

## 2023-02-16 NOTE — Progress Notes (Signed)
Subjective:    Patient ID: Stephanie Sanchez, female    DOB: 10/06/1955, 67 y.o.   MRN: [id number]  The patient is a 67 year old female here for evaluation of her forehead.  The patient had something removed from her forehead sometime ago by the dermatologist.  Ever since then it has been scabbing and draining.  She is very concerned about these changes.  She is not sure what it was but is pretty sure that it may have been benign.  We are going to try and get the records.  It is about 3 mm in size and does have some scabbing around the edges.  She is otherwise in good health and doing well.  No other skin lesions noted or of concern.      Review of Systems  Constitutional: Negative.   HENT: Negative.    Eyes: Negative.   Respiratory: Negative.    Cardiovascular: Negative.   Gastrointestinal: Negative.   Endocrine: Negative.   Genitourinary: Negative.   Musculoskeletal: Negative.        Objective:   Physical Exam Vitals reviewed.  Constitutional:      Appearance: Normal appearance.  HENT:     Head: Atraumatic.   Cardiovascular:     Rate and Rhythm: Normal rate.     Pulses: Normal pulses.  Skin:    General: Skin is warm.     Capillary Refill: Capillary refill takes less than 2 seconds.  Neurological:     Mental Status: She is alert and oriented to person, place, and time.  Psychiatric:        Mood and Affect: Mood normal.        Behavior: Behavior normal.        Thought Content: Thought content normal.        Judgment: Judgment normal.        Assessment & Plan:     ICD-10-CM   1. Changing skin lesion  L98.9        The recent changes recommend excision of changing skin lesion forehead.  Pictures were obtained of the patient and placed in the chart with the patient's or guardian's permission.

## 2023-02-23 ENCOUNTER — Encounter (HOSPITAL_BASED_OUTPATIENT_CLINIC_OR_DEPARTMENT_OTHER): Payer: Medicare Other | Admitting: Physical Therapy

## 2023-02-24 ENCOUNTER — Telehealth: Payer: Self-pay | Admitting: *Deleted

## 2023-02-24 NOTE — Telephone Encounter (Signed)
Faxed on (02/18/23) record release form to [facility]-Dr. Nicholas Lose.  For recent Bx results on the patient.  Copy scanned into the chart.//AR/CMA

## 2023-02-26 ENCOUNTER — Telehealth: Payer: Self-pay

## 2023-02-26 NOTE — Telephone Encounter (Signed)
Received Dermatology Office Visit with requested biopsy results. Will scan into patient's chart.

## 2023-03-24 ENCOUNTER — Ambulatory Visit: Payer: Medicare Other | Admitting: Plastic Surgery

## 2023-04-21 ENCOUNTER — Other Ambulatory Visit: Payer: Self-pay | Admitting: Internal Medicine

## 2023-04-21 DIAGNOSIS — M419 Scoliosis, unspecified: Secondary | ICD-10-CM

## 2023-04-21 DIAGNOSIS — M542 Cervicalgia: Secondary | ICD-10-CM

## 2023-05-10 ENCOUNTER — Ambulatory Visit
Admission: RE | Admit: 2023-05-10 | Discharge: 2023-05-10 | Disposition: A | Discharge status: Home / Self Care | Payer: Medicare Other | Source: Ambulatory Visit | Attending: Internal Medicine | Admitting: Internal Medicine

## 2023-05-10 DIAGNOSIS — M542 Cervicalgia: Secondary | ICD-10-CM

## 2023-05-10 DIAGNOSIS — M419 Scoliosis, unspecified: Secondary | ICD-10-CM

## 2023-10-21 ENCOUNTER — Ambulatory Visit (HOSPITAL_BASED_OUTPATIENT_CLINIC_OR_DEPARTMENT_OTHER): Attending: Internal Medicine | Admitting: Physical Therapy

## 2023-10-21 ENCOUNTER — Encounter (HOSPITAL_BASED_OUTPATIENT_CLINIC_OR_DEPARTMENT_OTHER): Payer: Self-pay | Admitting: Physical Therapy

## 2023-10-21 ENCOUNTER — Other Ambulatory Visit: Payer: Self-pay

## 2023-10-21 DIAGNOSIS — M6281 Muscle weakness (generalized): Secondary | ICD-10-CM | POA: Insufficient documentation

## 2023-10-21 DIAGNOSIS — R293 Abnormal posture: Secondary | ICD-10-CM | POA: Diagnosis present

## 2023-10-21 DIAGNOSIS — M5459 Other low back pain: Secondary | ICD-10-CM | POA: Diagnosis present

## 2023-10-21 NOTE — Therapy (Signed)
 OUTPATIENT PHYSICAL THERAPY THORACOLUMBAR EVALUATION   Patient Name: Stephanie Sanchez MRN: [id number] DOB:July 22, 1955, 68 y.o., female Today's Date: 10/21/2023  END OF SESSION:  PT End of Session - 10/21/23 1206     Visit Number 1    Number of Visits 8    Date for PT Re-Evaluation 12/04/23    Authorization Type mcr    PT Start Time 0800    PT Stop Time 0843    PT Time Calculation (min) 43 min    Activity Tolerance Patient tolerated treatment well    Behavior During Therapy [facility] for tasks assessed/performed          Past Medical History:  Diagnosis Date   Arthritis    Coronary artery disease involving native coronary artery    cardiologist--- dr h. Felipe Horton--- CT coronary 04-21-2019 calcium  score 10;  nuclear study 09-30-2013  normal w/ ef 61%   DDD (degenerative disc disease), lumbar    Esophagitis    EGD 04-30-2020   Gastritis    EGD 04-30-2020   GERD (gastroesophageal reflux disease)    History of cervical dysplasia    History of colon polyps    History of gastric ulcer    2013   History of squamous cell carcinoma excision    left elbow   Hyperlipemia    Hypertension    Lichen simplex chronicus    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HCC)    first dx 2013;  s/p  WLE and Vulvectomy's   PONV (postoperative nausea and vomiting)    Psoriasis    Scoliosis    cervicothoracic region   Past Surgical History:  Procedure Laterality Date   BLADDER SUSPENSION  2008   sling   BREAST EXCISIONAL BIOPSY Left    BREAST REDUCTION SURGERY Bilateral 12/05/2021   Procedure: MAMMARY REDUCTION  (BREAST);  Surgeon: Thornell Flirt, DO;  Location: [facility];  Service: Plastics;  Laterality: Bilateral;  3 hours   BUNIONECTOMY  2008   CARDIOVASCULAR STRESS TEST  09-30-2013   dr Renna Cary   normal nuclear study/  no ischemia/  normal LV function and wall motion,  ef 61%   CATARACT EXTRACTION W/ INTRAOCULAR LENS  IMPLANT, BILATERAL  2016   COLONOSCOPY WITH  ESOPHAGOGASTRODUODENOSCOPY (EGD)  04-30-2020  [facility] [place]   GANGLION CYST EXCISION Left 06/20/2021   Procedure: Excision of left foot ganglion cyst;  Surgeon: Amada Backer, MD;  Location: [facility];  Service: Orthopedics;  Laterality: Left;    HAND SURGERY Right 11/02/2012   hand reconstruction at [facility]   RADIOACTIVE SEED GUIDED EXCISIONAL BREAST BIOPSY Right 11/09/2018   Procedure: RADIOACTIVE SEED GUIDED EXCISIONAL RIGHT BREAST BIOPSY AND RIGHT AREOLA BIOPSY;  Surgeon: Enid Harry, MD;  Location: [facility];  Service: General;  Laterality: Right;   REDUCTION MAMMAPLASTY     TONSILLECTOMY  1973  age 23   VAGINAL HYSTERECTOMY  1988  age 24   VULVA /PERINEUM BIOPSY N/A 08/29/2014   Procedure: Corlis Dienes;  Surgeon: Alphonso Aschoff, MD;  Location: [facility];  Service: Gynecology;  Laterality: N/A;   VULVECTOMY N/A 08/29/2014   Procedure: WIDE LOCAL EXCISION OF VULVA;  Surgeon: Alphonso Aschoff, MD;  Location: [facility];  Service: Gynecology;  Laterality: N/A;   VULVECTOMY N/A 05/04/2015   Procedure: WIDE LOCAL EXCISION LEFT  VULVAR WITH BIOPSY OF RIGHT VULVA;  Surgeon: Alphonso Aschoff, MD;  Location: [facility];  Service: Gynecology;  Laterality: N/A;  VULVECTOMY Left 01/22/2016   Procedure: PARTIAL SIMPLE VULVECTOMY;  Surgeon: Alphonso Aschoff, MD;  Location: [facility];  Service: Gynecology;  Laterality: Left;   VULVECTOMY N/A 02/14/2016   Procedure: WIDE EXCISION VULVECTOMY;  Surgeon: Alphonso Aschoff, MD;  Location: [facility];  Service: Gynecology;  Laterality: N/A;   VULVECTOMY N/A 05/09/2020   Procedure: WIDE LOCAL EXCISION VULVECTOMY;  Surgeon: Alphonso Aschoff, MD;  Location: [facility];  Service: Gynecology;  Laterality: N/A;   Patient Active Problem List   Diagnosis Date Noted   Elevated coronary artery calcium  score 02/12/2023   Dyslipidemia 02/11/2023   Essential  hypertension 02/11/2023   Changing skin lesion 01/10/2022   Urge incontinence 06/11/2020   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HCC) 05/09/2020   Encounter for counseling 03/18/2019   Symptomatic mammary hypertrophy 03/18/2019   Neck pain 03/18/2019   Back pain 03/18/2019   Vulval cellulitis 02/27/2016   Lichen simplex chronicus 09/26/2014   Chest pain 10/21/2013   Dyspnea 10/21/2013   GERD (gastroesophageal reflux disease) 10/21/2013   Idiopathic scoliosis 10/21/2013   History of colonic polyps 10/21/2013   Genital atrophy of female 07/02/2012    PCP: Aldo Hun  REFERRING PROVIDER: Aldo Hun, MD   REFERRING DIAG: M51.360 (ICD-10-CM) - Other intervertebral disc degeneration, lumbar region with discogenic back pain only   Rationale for Evaluation and Treatment: Rehabilitation  THERAPY DIAG:  Other low back pain  Muscle weakness (generalized)  Abnormal posture  ONSET DATE: chronic  SUBJECTIVE:                                                                                                                                                                                           SUBJECTIVE STATEMENT: Pain in left trap today 5/10.  Left hip 0/10 today.  When flared in hip 9/10.  Left hip will talk to me each days, will buzz.  If I sit it will pass. Feel like my posture was better when I was doing the aquatic therapy.  Had to stop for a surgical procedure needed  PERTINENT HISTORY:  Bladder sling  PAIN:  Are you having pain? Yes: NPRS scale: current 5/10 Pain location: left trap; left hip (varies) Pain description: ache Aggravating factors: shopping Relieving factors: resting; TENS unit  PRECAUTIONS: None  RED FLAGS: None   WEIGHT BEARING RESTRICTIONS: No  FALLS:  Has patient fallen in last 6 months? No  LIVING ENVIRONMENT: Lives with: lives alone Lives in: House/apartment Stairs: No Has following equipment at home: None  PLOF: Independent  PATIENT GOALS:  Improve posture back to where we had it last time I did  aquatics. Want to tolerate sitting on the floor for 10 minutes and be able to get up from the floor without using  NEXT MD VISIT: as needed  OBJECTIVE:  Note: Objective measures were completed at Evaluation unless otherwise noted.  DIAGNOSTIC FINDINGS:  Xray in 2003: 25 deg centered at T-12-L1, 15 deg in thoracic labeled compensatory.    1/25 IMPRESSION: 1. L3-L4 moderate spinal canal stenosis with severe left and moderate right neural foraminal narrowing. Narrowing of the lateral recesses at this level could affect the descending L4 nerve roots. 2. L4-L5 moderate left neural foraminal narrowing. Effacement of the left lateral recess at this level could affect the descending left L5 nerve roots. 3. Additional mild neural foraminal narrowing on the right at L2-L3 and on the left at L5-S1.  PATIENT SURVEYS:  Modified Oswestry 9/50=18%   COGNITIVE STATUS: Within functional limits for tasks assessed        SENSATION: WFL   POSTURE:  Eval: thoraco dextroscoliosis, lumbar levoscoliosis, Rt ASIS elevation   HAND DOMINANCE:  Left   GAIT: Level of assistance: Complete Independence Comments: Rt trendelenburg  PALPATION: EVAL: spasm in right hip abd recreated concordant pain LUMBAR ROM:    LOWER EXTREMITY MMT:    HD  (Lb) Right eval Left eval  Hip flexion 21.1 21.8  Hip extension    Hip abduction 18.8 18.1  Hip adduction    Hip internal rotation    Hip external rotation    Knee flexion    Knee extension 5/5 5/5  Ankle dorsiflexion    Ankle plantarflexion    Ankle inversion    Ankle eversion     (Blank rows = not tested)    FUNCTIONAL TESTS:  4 stage balance passed    TREATMENT Eval Self care:Posture and Optometrist handout and instruction                                                                                                                               PATIENT EDUCATION:  Education  details: Discussed eval findings, rehab rationale, aquatic program progression/POC and pools in area. Patient is in agreement  Person educated: Patient Education method: Explanation Education comprehension: verbalized understanding  HOME EXERCISE PROGRAM: Land/aquatic [id number]   ASSESSMENT:  CLINICAL IMPRESSION: Patient is a 68 y.o. f who was seen today for physical therapy evaluation and treatment for chronic LBP. Recently finished a land based PT episode for which she report compliance with HEP.  She has had aquatic in past and feels she benefits most from setting building strength and improving posture. She continues to have strength which is about equal bilaterally although compared to last episode here at [facility] has decreased.  She is an active individual and will benefit from a short episode to re-instruct and add to aquatic exercise program for indep completion at personal pool  OBJECTIVE IMPAIRMENTS: decreased activity tolerance, decreased strength, increased muscle spasms, postural dysfunction, and pain.   ACTIVITY LIMITATIONS:  carrying and lifting  REHAB POTENTIAL: Good  CLINICAL DECISION MAKING: Evolving/moderate complexity  EVALUATION COMPLEXITY: Moderate   GOALS: Goals reviewed with patient? Yes  SHORT TERM GOALS: Target date: 11/02/23  Pt will tolerate full aquatic sessions consistently without increase in pain and with improving function to demonstrate good toleration and effectiveness of intervention.  Baseline: Goal status: INITIAL    LONG TERM GOALS: Target date: 12/04/23  Pt to improve on ODI by  5 % to demonstrate statistically significant Improvement in function. Baseline: 9/50=18% Goal status: INITIAL  2.  Pt to perform her regular exercise program without increased hip/LBP Baseline:  Goal status: INITIAL  3.  Pt to be indep with final aquatic HEP for management of chronic condition Baseline:  Goal status: INITIAL  4.  Pt will improve strength in hips  by up to 5 lbs to demonstrate improved overall physical function Baseline:  Goal status: INITIAL   PLAN:  PT FREQUENCY: 1-2x/week  PT DURATION: 6 weeks  PLANNED INTERVENTIONS: 97164- PT Re-evaluation, 97110-Therapeutic exercises, 97530- Therapeutic activity, 97112- Neuromuscular re-education, 97535- Self Care, 62130- Manual therapy, [id number]- Gait training, [phone]- Aquatic Therapy, [phone]- Ionotophoresis 4mg /ml Dexamethasone , 95284 (1-2 muscles), 20561 (3+ muscles)- Dry Needling, Patient/Family education, Balance training, Stair training, Taping, Joint mobilization, DME instructions, Cryotherapy, and Moist heat.  PLAN FOR NEXT SESSION: aquatics for core and hip strengthening; pain management; HEP   Lucinda Saber) Malonie Tatum MPT 10/21/23 12:20 PM [facility] [address] [place], [place], [address] Phone: [phone]   Fax:  [phone]

## 2023-10-28 ENCOUNTER — Ambulatory Visit (HOSPITAL_BASED_OUTPATIENT_CLINIC_OR_DEPARTMENT_OTHER): Admitting: Physical Therapy

## 2023-11-10 ENCOUNTER — Ambulatory Visit (HOSPITAL_BASED_OUTPATIENT_CLINIC_OR_DEPARTMENT_OTHER): Admitting: Physical Therapy

## 2023-11-12 ENCOUNTER — Ambulatory Visit (HOSPITAL_BASED_OUTPATIENT_CLINIC_OR_DEPARTMENT_OTHER): Attending: Internal Medicine | Admitting: Physical Therapy

## 2023-11-12 ENCOUNTER — Encounter (HOSPITAL_BASED_OUTPATIENT_CLINIC_OR_DEPARTMENT_OTHER): Payer: Self-pay | Admitting: Physical Therapy

## 2023-11-12 DIAGNOSIS — M6281 Muscle weakness (generalized): Secondary | ICD-10-CM | POA: Diagnosis present

## 2023-11-12 DIAGNOSIS — M25551 Pain in right hip: Secondary | ICD-10-CM | POA: Insufficient documentation

## 2023-11-12 DIAGNOSIS — R293 Abnormal posture: Secondary | ICD-10-CM | POA: Diagnosis present

## 2023-11-12 DIAGNOSIS — M5459 Other low back pain: Secondary | ICD-10-CM | POA: Diagnosis present

## 2023-11-12 NOTE — Therapy (Signed)
 OUTPATIENT PHYSICAL THERAPY THORACOLUMBAR EVALUATION   Patient Name: Stephanie Sanchez MRN: [id number] DOB:May 07, 1955, 68 y.o., female Today's Date: 11/12/2023  END OF SESSION:  PT End of Session - 11/12/23 1059     Visit Number 2    Number of Visits 8    Date for PT Re-Evaluation 12/04/23    Authorization Type mcr    PT Start Time 1100    PT Stop Time 1140    PT Time Calculation (min) 40 min    Activity Tolerance Patient tolerated treatment well    Behavior During Therapy [facility] for tasks assessed/performed          Past Medical History:  Diagnosis Date   Arthritis    Coronary artery disease involving native coronary artery    cardiologist--- dr h. claudene--- CT coronary 04-21-2019 calcium  score 10;  nuclear study 09-30-2013  normal w/ ef 61%   DDD (degenerative disc disease), lumbar    Esophagitis    EGD 04-30-2020   Gastritis    EGD 04-30-2020   GERD (gastroesophageal reflux disease)    History of cervical dysplasia    History of colon polyps    History of gastric ulcer    2013   History of squamous cell carcinoma excision    left elbow   Hyperlipemia    Hypertension    Lichen simplex chronicus    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HCC)    first dx 2013;  s/p  WLE and Vulvectomy's   PONV (postoperative nausea and vomiting)    Psoriasis    Scoliosis    cervicothoracic region   Past Surgical History:  Procedure Laterality Date   BLADDER SUSPENSION  2008   sling   BREAST EXCISIONAL BIOPSY Left    BREAST REDUCTION SURGERY Bilateral 12/05/2021   Procedure: MAMMARY REDUCTION  (BREAST);  Surgeon: Lowery Estefana RAMAN, DO;  Location: [facility];  Service: Plastics;  Laterality: Bilateral;  3 hours   BUNIONECTOMY  2008   CARDIOVASCULAR STRESS TEST  09-30-2013   dr jeffrie   normal nuclear study/  no ischemia/  normal LV function and wall motion,  ef 61%   CATARACT EXTRACTION W/ INTRAOCULAR LENS  IMPLANT, BILATERAL  2016   COLONOSCOPY WITH  ESOPHAGOGASTRODUODENOSCOPY (EGD)  04-30-2020  [facility] [place]   GANGLION CYST EXCISION Left 06/20/2021   Procedure: Excision of left foot ganglion cyst;  Surgeon: Kit Rush, MD;  Location: [facility];  Service: Orthopedics;  Laterality: Left;    HAND SURGERY Right 11/02/2012   hand reconstruction at [facility]   RADIOACTIVE SEED GUIDED EXCISIONAL BREAST BIOPSY Right 11/09/2018   Procedure: RADIOACTIVE SEED GUIDED EXCISIONAL RIGHT BREAST BIOPSY AND RIGHT AREOLA BIOPSY;  Surgeon: Ebbie Cough, MD;  Location: [facility];  Service: General;  Laterality: Right;   REDUCTION MAMMAPLASTY     TONSILLECTOMY  1973  age 38   VAGINAL HYSTERECTOMY  1988  age 32   VULVA /PERINEUM BIOPSY N/A 08/29/2014   Procedure: ERROL DIVERS;  Surgeon: Maurilio Ship, MD;  Location: [facility];  Service: Gynecology;  Laterality: N/A;   VULVECTOMY N/A 08/29/2014   Procedure: WIDE LOCAL EXCISION OF VULVA;  Surgeon: Maurilio Ship, MD;  Location: [facility];  Service: Gynecology;  Laterality: N/A;   VULVECTOMY N/A 05/04/2015   Procedure: WIDE LOCAL EXCISION LEFT  VULVAR WITH BIOPSY OF RIGHT VULVA;  Surgeon: Maurilio Ship, MD;  Location: [facility];  Service: Gynecology;  Laterality: N/A;  VULVECTOMY Left 01/22/2016   Procedure: PARTIAL SIMPLE VULVECTOMY;  Surgeon: Maurilio Ship, MD;  Location: [facility];  Service: Gynecology;  Laterality: Left;   VULVECTOMY N/A 02/14/2016   Procedure: WIDE EXCISION VULVECTOMY;  Surgeon: Maurilio Ship, MD;  Location: [facility];  Service: Gynecology;  Laterality: N/A;   VULVECTOMY N/A 05/09/2020   Procedure: WIDE LOCAL EXCISION VULVECTOMY;  Surgeon: Ship Maurilio, MD;  Location: [facility];  Service: Gynecology;  Laterality: N/A;   Patient Active Problem List   Diagnosis Date Noted   Elevated coronary artery calcium  score 02/12/2023   Dyslipidemia 02/11/2023   Essential  hypertension 02/11/2023   Changing skin lesion 01/10/2022   Urge incontinence 06/11/2020   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HCC) 05/09/2020   Encounter for counseling 03/18/2019   Symptomatic mammary hypertrophy 03/18/2019   Neck pain 03/18/2019   Back pain 03/18/2019   Vulval cellulitis 02/27/2016   Lichen simplex chronicus 09/26/2014   Chest pain 10/21/2013   Dyspnea 10/21/2013   GERD (gastroesophageal reflux disease) 10/21/2013   Idiopathic scoliosis 10/21/2013   History of colonic polyps 10/21/2013   Genital atrophy of female 07/02/2012    PCP: Oneil Neth  REFERRING PROVIDER: [name], MD   REFERRING DIAG: M51.360 (ICD-10-CM) - Other intervertebral disc degeneration, lumbar region with discogenic back pain only   Rationale for Evaluation and Treatment: Rehabilitation  THERAPY DIAG:  Other low back pain  Muscle weakness (generalized)  Abnormal posture  ONSET DATE: chronic  SUBJECTIVE:                                                                                                                                                                                           SUBJECTIVE STATEMENT: No changes.  Already walked 5 miles today   Initial Subjective Pain in left trap today 5/10.  Left hip 0/10 today.  When flared in hip 9/10.  Left hip will talk to me each days, will buzz.  If I sit it will pass. Feel like my posture was better when I was doing the aquatic therapy.  Had to stop for a surgical procedure needed  PERTINENT HISTORY:  Bladder sling  PAIN:  Are you having pain? Yes: NPRS scale: current 5/10 Pain location: left trap; left hip (varies) Pain description: ache Aggravating factors: shopping Relieving factors: resting; TENS unit  PRECAUTIONS: None  RED FLAGS: None   WEIGHT BEARING RESTRICTIONS: No  FALLS:  Has patient fallen in last 6 months? No  LIVING ENVIRONMENT: Lives with: lives alone Lives in: House/apartment Stairs: No Has  following equipment at home: None  PLOF: Independent  PATIENT GOALS:  Improve posture back to where we had it last time I did aquatics. Want to tolerate sitting on the floor for 10 minutes and be able to get up from the floor without using  NEXT MD VISIT: as needed  OBJECTIVE:  Note: Objective measures were completed at Evaluation unless otherwise noted.  DIAGNOSTIC FINDINGS:  Xray in 2003: 25 deg centered at T-12-L1, 15 deg in thoracic labeled compensatory.    1/25 IMPRESSION: 1. L3-L4 moderate spinal canal stenosis with severe left and moderate right neural foraminal narrowing. Narrowing of the lateral recesses at this level could affect the descending L4 nerve roots. 2. L4-L5 moderate left neural foraminal narrowing. Effacement of the left lateral recess at this level could affect the descending left L5 nerve roots. 3. Additional mild neural foraminal narrowing on the right at L2-L3 and on the left at L5-S1.  PATIENT SURVEYS:  Modified Oswestry 9/50=18%   COGNITIVE STATUS: Within functional limits for tasks assessed        SENSATION: WFL   POSTURE:  Eval: thoraco dextroscoliosis, lumbar levoscoliosis, Rt ASIS elevation   HAND DOMINANCE:  Left   GAIT: Level of assistance: Complete Independence Comments: Rt trendelenburg  PALPATION: EVAL: spasm in right hip abd recreated concordant pain LUMBAR ROM:    LOWER EXTREMITY MMT:    HD  (Lb) Right eval Left eval  Hip flexion 21.1 21.8  Hip extension    Hip abduction 18.8 18.1  Hip adduction    Hip internal rotation    Hip external rotation    Knee flexion    Knee extension 5/5 5/5  Ankle dorsiflexion    Ankle plantarflexion    Ankle inversion    Ankle eversion     (Blank rows = not tested)    FUNCTIONAL TESTS:  4 stage balance passed    TREATMENT [facility] Adult PT Treatment:                                                DATE: 11/12/23 Pt seen for aquatic therapy today.  Treatment took place in water  3.5-4.75 ft in depth at the [facility] pool. Temp of water was 91.  Pt entered/exited the pool via stairs using alternating pattern with hand rail.  -plank using bench->hip extension -Side plank (right) instruction ue support bench opposite YHB-> barbell with isometric hold  -forward plank on barbell isometric hold x 3 -Sitting balance on yellow noodle gaining position then again with hands out of water after several tries -Side to side pendulum. Holding position for lateral core stretch R/L. VC and demo for execution   Pt requires the buoyancy and hydrostatic pressure of water for support, and to offload joints by unweighting joint load by at least 50 % in navel deep water and by at least 75-80% in chest to neck deep water.  Viscosity of the water is needed for resistance of strengthening. Water current perturbations provides challenge to standing balance requiring increased core activation.  PATIENT EDUCATION:  Education details: Discussed eval findings, rehab rationale, aquatic program progression/POC and pools in area. Patient is in agreement  Person educated: Patient Education method: Explanation Education comprehension: verbalized understanding  HOME EXERCISE PROGRAM: Land/aquatic [id number]   ASSESSMENT:  CLINICAL IMPRESSION: Pt demonstrates safety and independence in aquatic setting with therapist instructing from deck. She is confident in setting, moving throughout all depths easily. Re-initiation to water Pilates-begun with using bench then progressed quickly to barbell (plan to progress towards using noodle). Just touched on pendulums side to side.  Pts ue fatigue ultimately ends session.  She is instructed on trapezius stretches for out of pool due to complaints of increased tightness post session. Otherwise good toleration, no pain.  Goals  are ongoing.      Initial Impression Patient is a 68 y.o. f who was seen today for physical therapy evaluation and treatment for chronic LBP. Recently finished a land based PT episode for which she report compliance with HEP.  She has had aquatic in past and feels she benefits most from setting building strength and improving posture. She continues to have strength which is about equal bilaterally although compared to last episode here at [facility] has decreased.  She is an active individual and will benefit from a short episode to re-instruct and add to aquatic exercise program for indep completion at personal pool  OBJECTIVE IMPAIRMENTS: decreased activity tolerance, decreased strength, increased muscle spasms, postural dysfunction, and pain.   ACTIVITY LIMITATIONS: carrying and lifting  REHAB POTENTIAL: Good  CLINICAL DECISION MAKING: Evolving/moderate complexity  EVALUATION COMPLEXITY: Moderate   GOALS: Goals reviewed with patient? Yes  SHORT TERM GOALS: Target date: 11/02/23  Pt will tolerate full aquatic sessions consistently without increase in pain and with improving function to demonstrate good toleration and effectiveness of intervention.  Baseline: Goal status: Met 11/12/23    LONG TERM GOALS: Target date: 12/04/23  Pt to improve on ODI by  5 % to demonstrate statistically significant Improvement in function. Baseline: 9/50=18% Goal status: INITIAL  2.  Pt to perform her regular exercise program without increased hip/LBP Baseline:  Goal status: INITIAL  3.  Pt to be indep with final aquatic HEP for management of chronic condition Baseline:  Goal status: INITIAL  4.  Pt will improve strength in hips by up to 5 lbs to demonstrate improved overall physical function Baseline:  Goal status: INITIAL   PLAN:  PT FREQUENCY: 1-2x/week  PT DURATION: 6 weeks  PLANNED INTERVENTIONS: 97164- PT Re-evaluation, 97110-Therapeutic exercises, 97530- Therapeutic activity, 97112-  Neuromuscular re-education, 97535- Self Care, 02859- Manual therapy, [id number]- Gait training, [phone]- Aquatic Therapy, [phone]- Ionotophoresis 4mg /ml Dexamethasone , 79439 (1-2 muscles), 20561 (3+ muscles)- Dry Needling, Patient/Family education, Balance training, Stair training, Taping, Joint mobilization, DME instructions, Cryotherapy, and Moist heat.  PLAN FOR NEXT SESSION: aquatics for core and hip strengthening; pain management; HEP   Ronal Foots) Javontay Vandam MPT 11/12/23 11:00 AM [facility] [address] [place], [place], [address] Phone: [phone]   Fax:  [phone]

## 2023-11-17 ENCOUNTER — Encounter (HOSPITAL_BASED_OUTPATIENT_CLINIC_OR_DEPARTMENT_OTHER): Payer: Self-pay | Admitting: Physical Therapy

## 2023-11-17 ENCOUNTER — Ambulatory Visit (HOSPITAL_BASED_OUTPATIENT_CLINIC_OR_DEPARTMENT_OTHER): Admitting: Physical Therapy

## 2023-11-17 DIAGNOSIS — M25551 Pain in right hip: Secondary | ICD-10-CM

## 2023-11-17 DIAGNOSIS — R293 Abnormal posture: Secondary | ICD-10-CM

## 2023-11-17 DIAGNOSIS — M5459 Other low back pain: Secondary | ICD-10-CM

## 2023-11-17 DIAGNOSIS — M6281 Muscle weakness (generalized): Secondary | ICD-10-CM

## 2023-11-17 NOTE — Therapy (Signed)
 OUTPATIENT PHYSICAL THERAPY THORACOLUMBAR EVALUATION   Patient Name: Stephanie Sanchez MRN: [id number] DOB:[birth year], 68 y.o., female Today's Date: 11/17/2023  END OF SESSION:  PT End of Session - 11/17/23 1106     Visit Number 3    Number of Visits 8    Date for PT Re-Evaluation 12/04/23    Authorization Type mcr    PT Start Time 1103    PT Stop Time 1145    PT Time Calculation (min) 42 min    Activity Tolerance Patient tolerated treatment well    Behavior During Therapy [facility] for tasks assessed/performed          Past Medical History:  Diagnosis Date   Arthritis    Coronary artery disease involving native coronary artery    cardiologist--- dr h. claudene--- CT coronary 04-21-2019 calcium  score 10;  nuclear study 09-30-2013  normal w/ ef 61%   DDD (degenerative disc disease), lumbar    Esophagitis    EGD 04-30-2020   Gastritis    EGD 04-30-2020   GERD (gastroesophageal reflux disease)    History of cervical dysplasia    History of colon polyps    History of gastric ulcer    2013   History of squamous cell carcinoma excision    left elbow   Hyperlipemia    Hypertension    Lichen simplex chronicus    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HCC)    first dx 2013;  s/p  WLE and Vulvectomy's   PONV (postoperative nausea and vomiting)    Psoriasis    Scoliosis    cervicothoracic region   Past Surgical History:  Procedure Laterality Date   BLADDER SUSPENSION  2008   sling   BREAST EXCISIONAL BIOPSY Left    BREAST REDUCTION SURGERY Bilateral 12/05/2021   Procedure: MAMMARY REDUCTION  (BREAST);  Surgeon: Lowery Estefana RAMAN, DO;  Location: [facility];  Service: Plastics;  Laterality: Bilateral;  3 hours   BUNIONECTOMY  2008   CARDIOVASCULAR STRESS TEST  09-30-2013   dr jeffrie   normal nuclear study/  no ischemia/  normal LV function and wall motion,  ef 61%   CATARACT EXTRACTION W/ INTRAOCULAR LENS  IMPLANT, BILATERAL  2016   COLONOSCOPY WITH  ESOPHAGOGASTRODUODENOSCOPY (EGD)  04-30-2020  [facility] in [place]   GANGLION CYST EXCISION Left 06/20/2021   Procedure: Excision of left foot ganglion cyst;  Surgeon: Kit Rush, MD;  Location: [facility];  Service: Orthopedics;  Laterality: Left;    HAND SURGERY Right 11/02/2012   hand reconstruction at [facility]   RADIOACTIVE SEED GUIDED EXCISIONAL BREAST BIOPSY Right 11/09/2018   Procedure: RADIOACTIVE SEED GUIDED EXCISIONAL RIGHT BREAST BIOPSY AND RIGHT AREOLA BIOPSY;  Surgeon: Ebbie Cough, MD;  Location: [facility];  Service: General;  Laterality: Right;   REDUCTION MAMMAPLASTY     TONSILLECTOMY  1973  age 49   VAGINAL HYSTERECTOMY  1988  age 82   VULVA /PERINEUM BIOPSY N/A 08/29/2014   Procedure: ERROL DIVERS;  Surgeon: Maurilio Ship, MD;  Location: [facility];  Service: Gynecology;  Laterality: N/A;   VULVECTOMY N/A 08/29/2014   Procedure: WIDE LOCAL EXCISION OF VULVA;  Surgeon: Maurilio Ship, MD;  Location: [facility];  Service: Gynecology;  Laterality: N/A;   VULVECTOMY N/A 05/04/2015   Procedure: WIDE LOCAL EXCISION LEFT  VULVAR WITH BIOPSY OF RIGHT VULVA;  Surgeon: Maurilio Ship, MD;  Location: [facility];  Service: Gynecology;  Laterality: N/A;  VULVECTOMY Left 01/22/2016   Procedure: PARTIAL SIMPLE VULVECTOMY;  Surgeon: Maurilio Ship, MD;  Location: [facility];  Service: Gynecology;  Laterality: Left;   VULVECTOMY N/A 02/14/2016   Procedure: WIDE EXCISION VULVECTOMY;  Surgeon: Maurilio Ship, MD;  Location: [facility];  Service: Gynecology;  Laterality: N/A;   VULVECTOMY N/A 05/09/2020   Procedure: WIDE LOCAL EXCISION VULVECTOMY;  Surgeon: Ship Maurilio, MD;  Location: [facility];  Service: Gynecology;  Laterality: N/A;   Patient Active Problem List   Diagnosis Date Noted   Elevated coronary artery calcium  score 02/12/2023   Dyslipidemia 02/11/2023   Essential  hypertension 02/11/2023   Changing skin lesion 01/10/2022   Urge incontinence 06/11/2020   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HCC) 05/09/2020   Encounter for counseling 03/18/2019   Symptomatic mammary hypertrophy 03/18/2019   Neck pain 03/18/2019   Back pain 03/18/2019   Vulval cellulitis 02/27/2016   Lichen simplex chronicus 09/26/2014   Chest pain 10/21/2013   Dyspnea 10/21/2013   GERD (gastroesophageal reflux disease) 10/21/2013   Idiopathic scoliosis 10/21/2013   History of colonic polyps 10/21/2013   Genital atrophy of female 07/02/2012    PCP: Oneil Neth  REFERRING PROVIDER: [name], MD   REFERRING DIAG: M51.360 (ICD-10-CM) - Other intervertebral disc degeneration, lumbar region with discogenic back pain only   Rationale for Evaluation and Treatment: Rehabilitation  THERAPY DIAG:  Other low back pain  Muscle weakness (generalized)  Abnormal posture  Pain in right hip  ONSET DATE: chronic  SUBJECTIVE:                                                                                                                                                                                           SUBJECTIVE STATEMENT: Felt good after last session no fatigue or pain.  Arms were a little sore Overall pain today 3/10   Initial Subjective Pain in left trap today 5/10.  Left hip 0/10 today.  When flared in hip 9/10.  Left hip will talk to me each days, will buzz.  If I sit it will pass. Feel like my posture was better when I was doing the aquatic therapy.  Had to stop for a surgical procedure needed  PERTINENT HISTORY:  Bladder sling  PAIN:  Are you having pain? Yes: NPRS scale: current 5/10 Pain location: left trap; left hip (varies) Pain description: ache Aggravating factors: shopping Relieving factors: resting; TENS unit  PRECAUTIONS: None  RED FLAGS: None   WEIGHT BEARING RESTRICTIONS: No  FALLS:  Has patient fallen in last 6 months? No  LIVING  ENVIRONMENT: Lives with: lives alone Lives  in: House/apartment Stairs: No Has following equipment at home: None  PLOF: Independent  PATIENT GOALS: Improve posture back to where we had it last time I did aquatics. Want to tolerate sitting on the floor for 10 minutes and be able to get up from the floor without using  NEXT MD VISIT: as needed  OBJECTIVE:  Note: Objective measures were completed at Evaluation unless otherwise noted.  DIAGNOSTIC FINDINGS:  Xray in 2003: 25 deg centered at T-12-L1, 15 deg in thoracic labeled compensatory.    1/25 IMPRESSION: 1. L3-L4 moderate spinal canal stenosis with severe left and moderate right neural foraminal narrowing. Narrowing of the lateral recesses at this level could affect the descending L4 nerve roots. 2. L4-L5 moderate left neural foraminal narrowing. Effacement of the left lateral recess at this level could affect the descending left L5 nerve roots. 3. Additional mild neural foraminal narrowing on the right at L2-L3 and on the left at L5-S1.  PATIENT SURVEYS:  Modified Oswestry 9/50=18%   COGNITIVE STATUS: Within functional limits for tasks assessed        SENSATION: WFL   POSTURE:  Eval: thoraco dextroscoliosis, lumbar levoscoliosis, Rt ASIS elevation   HAND DOMINANCE:  Left   GAIT: Level of assistance: Complete Independence Comments: Rt trendelenburg  PALPATION: EVAL: spasm in right hip abd recreated concordant pain LUMBAR ROM:    LOWER EXTREMITY MMT:    HD  (Lb) Right eval Left eval  Hip flexion 21.1 21.8  Hip extension    Hip abduction 18.8 18.1  Hip adduction    Hip internal rotation    Hip external rotation    Knee flexion    Knee extension 5/5 5/5  Ankle dorsiflexion    Ankle plantarflexion    Ankle inversion    Ankle eversion     (Blank rows = not tested)    FUNCTIONAL TESTS:  4 stage balance passed    TREATMENT [facility] Adult PT Treatment:                                                 DATE: 11/17/23 Pt seen for aquatic therapy today.  Treatment took place in water 3.5-4.75 ft in depth at the [facility] pool. Temp of water was 91.  Pt entered/exited the pool via stairs using alternating pattern with hand rail.  -Side to side pendulum. Holding position for lateral core stretch R/L. VC and demo for execution.  Effort to left not right 2 x 5 once motor plan established -suspended supine x 5 with 10s hold -Forward to back pendulum x 5 rotation -Left resisted side bending KB with left hand on and shoulder at 80d-rue using yellow HB submerged at hip -plank using bench->hip extension -Sitting balance on yellow noodle gaining position then again with hands out of water after several tries    Pt requires the buoyancy and hydrostatic pressure of water for support, and to offload joints by unweighting joint load by at least 50 % in navel deep water and by at least 75-80% in chest to neck deep water.  Viscosity of the water is needed for resistance of strengthening. Water current perturbations provides challenge to standing balance requiring increased core activation.  PATIENT EDUCATION:  Education details: Discussed eval findings, rehab rationale, aquatic program progression/POC and pools in area. Patient is in agreement  Person educated: Patient Education method: Explanation Education comprehension: verbalized understanding  HOME EXERCISE PROGRAM: Land/aquatic [id number] from previous episode  Aquatic This aquatic home exercise program from [employer] utilizes pictures from land based exercises, but has been adapted prior to lamination and issuance.   Access Code: [email or web address] URL: [email or web address] Date: 11/17/2023 Prepared by: Matilda Kohut  Exercises - Side to Side Pendulum Swing with Foam Dumbbells and Ankle Floats  - 1  x daily - 7 x weekly - 3 sets - 10 reps - Forward Backward Pendulum Swings with Hip Abduction and Adduction  - 1 x daily - 7 x weekly - 3 sets - 10 reps - Seated Flexion Stretch with Swiss Ball  - 1 x daily - 7 x weekly - 3 sets - 10 reps - Standing Isometric Cervical Rotation and Shoulder Abduction at Wall with Ball   - 1 x daily - 7 x weekly - 3 sets - 10 reps  ASSESSMENT:  CLINICAL IMPRESSION: Began with pendulums, where we left off last session.  She is provided vc and demonstration on execution side to side as well as front to back. Specific movements instructed to focus on left core musculature to encourage strengthening opposite of curve.  She executes well once motor plans established.  Pt edu on focus muscle groups with each exercise to ensure engagemnt of desired area.  Good session       Initial Impression Patient is a 68 y.o. f who was seen today for physical therapy evaluation and treatment for chronic LBP. Recently finished a land based PT episode for which she report compliance with HEP.  She has had aquatic in past and feels she benefits most from setting building strength and improving posture. She continues to have strength which is about equal bilaterally although compared to last episode [facility] at [facility] has decreased.  She is an active individual and will benefit from a short episode to re-instruct and add to aquatic exercise program for indep completion at personal pool  OBJECTIVE IMPAIRMENTS: decreased activity tolerance, decreased strength, increased muscle spasms, postural dysfunction, and pain.   ACTIVITY LIMITATIONS: carrying and lifting  REHAB POTENTIAL: Good  CLINICAL DECISION MAKING: Evolving/moderate complexity  EVALUATION COMPLEXITY: Moderate   GOALS: Goals reviewed with patient? Yes  SHORT TERM GOALS: Target date: 11/02/23  Pt will tolerate full aquatic sessions consistently without increase in pain and with improving function to demonstrate good toleration  and effectiveness of intervention.  Baseline: Goal status: Met 11/12/23    LONG TERM GOALS: Target date: 12/04/23  Pt to improve on ODI by  5 % to demonstrate statistically significant Improvement in function. Baseline: 9/50=18% Goal status: INITIAL  2.  Pt to perform her regular exercise program without increased hip/LBP Baseline:  Goal status: INITIAL  3.  Pt to be indep with final aquatic HEP for management of chronic condition Baseline:  Goal status: INITIAL  4.  Pt will improve strength in hips by up to 5 lbs to demonstrate improved overall physical function Baseline:  Goal status: INITIAL   PLAN:  PT FREQUENCY: 1-2x/week  PT DURATION: 6 weeks  PLANNED INTERVENTIONS: 97164- PT Re-evaluation, 97110-Therapeutic exercises, 97530- Therapeutic activity, 97112- Neuromuscular re-education, 97535- Self Care, [id number]- Manual therapy, [id number]- Gait training, [phone]- Aquatic Therapy, [phone]- Ionotophoresis 4mg /ml Dexamethasone , 79439 (1-2 muscles), 20561 (3+ muscles)- Dry Needling, Patient/Family education, Balance training, Stair training, Taping, Joint mobilization,  DME instructions, Cryotherapy, and Moist heat.  PLAN FOR NEXT SESSION: aquatics for core and hip strengthening; pain management; HEP   Ronal Foots) Sylvanna Burggraf MPT 11/17/23 11:08 AM [facility] [address] [place], [place], [address] Phone: [phone]   Fax:  [phone]

## 2023-11-19 ENCOUNTER — Ambulatory Visit (HOSPITAL_BASED_OUTPATIENT_CLINIC_OR_DEPARTMENT_OTHER): Admitting: Physical Therapy

## 2023-11-23 ENCOUNTER — Ambulatory Visit (HOSPITAL_BASED_OUTPATIENT_CLINIC_OR_DEPARTMENT_OTHER): Admitting: Physical Therapy

## 2023-11-23 ENCOUNTER — Encounter (HOSPITAL_BASED_OUTPATIENT_CLINIC_OR_DEPARTMENT_OTHER): Payer: Self-pay

## 2023-11-24 ENCOUNTER — Ambulatory Visit (HOSPITAL_BASED_OUTPATIENT_CLINIC_OR_DEPARTMENT_OTHER): Admitting: Physical Therapy

## 2023-11-26 ENCOUNTER — Ambulatory Visit (HOSPITAL_BASED_OUTPATIENT_CLINIC_OR_DEPARTMENT_OTHER): Admitting: Physical Therapy

## 2023-11-26 ENCOUNTER — Encounter (HOSPITAL_BASED_OUTPATIENT_CLINIC_OR_DEPARTMENT_OTHER): Payer: Self-pay | Admitting: Physical Therapy

## 2023-11-26 DIAGNOSIS — M6281 Muscle weakness (generalized): Secondary | ICD-10-CM

## 2023-11-26 DIAGNOSIS — M5459 Other low back pain: Secondary | ICD-10-CM

## 2023-11-26 DIAGNOSIS — R293 Abnormal posture: Secondary | ICD-10-CM

## 2023-11-26 NOTE — Therapy (Signed)
 OUTPATIENT PHYSICAL THERAPY THORACOLUMBAR EVALUATION   Patient Name: Stephanie Sanchez MRN: [id number] DOB:1955/05/18, 68 y.o., female Today's Date: 11/26/2023  END OF SESSION:  PT End of Session - 11/26/23 1143     Visit Number 4    Number of Visits 8    Date for PT Re-Evaluation 12/04/23    Authorization Type mcr    PT Start Time 1100    PT Stop Time 1138    PT Time Calculation (min) 38 min    Activity Tolerance Patient tolerated treatment well    Behavior During Therapy [facility] for tasks assessed/performed           Past Medical History:  Diagnosis Date   Arthritis    Coronary artery disease involving native coronary artery    cardiologist--- dr h. claudene--- CT coronary 04-21-2019 calcium  score 10;  nuclear study 09-30-2013  normal w/ ef 61%   DDD (degenerative disc disease), lumbar    Esophagitis    EGD 04-30-2020   Gastritis    EGD 04-30-2020   GERD (gastroesophageal reflux disease)    History of cervical dysplasia    History of colon polyps    History of gastric ulcer    2013   History of squamous cell carcinoma excision    left elbow   Hyperlipemia    Hypertension    Lichen simplex chronicus    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HCC)    first dx 2013;  s/p  WLE and Vulvectomy's   PONV (postoperative nausea and vomiting)    Psoriasis    Scoliosis    cervicothoracic region   Past Surgical History:  Procedure Laterality Date   BLADDER SUSPENSION  2008   sling   BREAST EXCISIONAL BIOPSY Left    BREAST REDUCTION SURGERY Bilateral 12/05/2021   Procedure: MAMMARY REDUCTION  (BREAST);  Surgeon: Lowery Estefana RAMAN, DO;  Location: [facility];  Service: Plastics;  Laterality: Bilateral;  3 hours   BUNIONECTOMY  2008   CARDIOVASCULAR STRESS TEST  09-30-2013   dr jeffrie   normal nuclear study/  no ischemia/  normal LV function and wall motion,  ef 61%   CATARACT EXTRACTION W/ INTRAOCULAR LENS  IMPLANT, BILATERAL  2016   COLONOSCOPY WITH  ESOPHAGOGASTRODUODENOSCOPY (EGD)  04-30-2020  [facility] in [place]   GANGLION CYST EXCISION Left 06/20/2021   Procedure: Excision of left foot ganglion cyst;  Surgeon: Kit Rush, MD;  Location: [facility];  Service: Orthopedics;  Laterality: Left;    HAND SURGERY Right 11/02/2012   hand reconstruction at [facility]   RADIOACTIVE SEED GUIDED EXCISIONAL BREAST BIOPSY Right 11/09/2018   Procedure: RADIOACTIVE SEED GUIDED EXCISIONAL RIGHT BREAST BIOPSY AND RIGHT AREOLA BIOPSY;  Surgeon: Ebbie Cough, MD;  Location: [facility];  Service: General;  Laterality: Right;   REDUCTION MAMMAPLASTY     TONSILLECTOMY  1973  age 41   VAGINAL HYSTERECTOMY  1988  age 45   VULVA /PERINEUM BIOPSY N/A 08/29/2014   Procedure: ERROL DIVERS;  Surgeon: Maurilio Ship, MD;  Location: [facility];  Service: Gynecology;  Laterality: N/A;   VULVECTOMY N/A 08/29/2014   Procedure: WIDE LOCAL EXCISION OF VULVA;  Surgeon: Maurilio Ship, MD;  Location: [facility];  Service: Gynecology;  Laterality: N/A;   VULVECTOMY N/A 05/04/2015   Procedure: WIDE LOCAL EXCISION LEFT  VULVAR WITH BIOPSY OF RIGHT VULVA;  Surgeon: Maurilio Ship, MD;  Location: [facility];  Service: Gynecology;  Laterality: N/A;  VULVECTOMY Left 01/22/2016   Procedure: PARTIAL SIMPLE VULVECTOMY;  Surgeon: Maurilio Ship, MD;  Location: [facility];  Service: Gynecology;  Laterality: Left;   VULVECTOMY N/A 02/14/2016   Procedure: WIDE EXCISION VULVECTOMY;  Surgeon: Maurilio Ship, MD;  Location: [facility];  Service: Gynecology;  Laterality: N/A;   VULVECTOMY N/A 05/09/2020   Procedure: WIDE LOCAL EXCISION VULVECTOMY;  Surgeon: Ship Maurilio, MD;  Location: [facility];  Service: Gynecology;  Laterality: N/A;   Patient Active Problem List   Diagnosis Date Noted   Elevated coronary artery calcium  score 02/12/2023   Dyslipidemia 02/11/2023   Essential  hypertension 02/11/2023   Changing skin lesion 01/10/2022   Urge incontinence 06/11/2020   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HCC) 05/09/2020   Encounter for counseling 03/18/2019   Symptomatic mammary hypertrophy 03/18/2019   Neck pain 03/18/2019   Back pain 03/18/2019   Vulval cellulitis 02/27/2016   Lichen simplex chronicus 09/26/2014   Chest pain 10/21/2013   Dyspnea 10/21/2013   GERD (gastroesophageal reflux disease) 10/21/2013   Idiopathic scoliosis 10/21/2013   History of colonic polyps 10/21/2013   Genital atrophy of female 07/02/2012    PCP: Oneil Neth  REFERRING PROVIDER: [name], MD   REFERRING DIAG: M51.360 (ICD-10-CM) - Other intervertebral disc degeneration, lumbar region with discogenic back pain only   Rationale for Evaluation and Treatment: Rehabilitation  THERAPY DIAG:  Other low back pain  Muscle weakness (generalized)  Abnormal posture  ONSET DATE: chronic  SUBJECTIVE:                                                                                                                                                                                           SUBJECTIVE STATEMENT: Walked 5 miles this morning.  Some stiffness in my left hip area   Initial Subjective Pain in left trap today 5/10.  Left hip 0/10 today.  When flared in hip 9/10.  Left hip will talk to me each days, will buzz.  If I sit it will pass. Feel like my posture was better when I was doing the aquatic therapy.  Had to stop for a surgical procedure needed  PERTINENT HISTORY:  Bladder sling  PAIN:  Are you having pain? Yes: NPRS scale: current 2/10 Pain location: left trap; left hip (varies) Pain description: ache Aggravating factors: shopping Relieving factors: resting; TENS unit  PRECAUTIONS: None  RED FLAGS: None   WEIGHT BEARING RESTRICTIONS: No  FALLS:  Has patient fallen in last 6 months? No  LIVING ENVIRONMENT: Lives with: lives alone Lives in:  House/apartment Stairs: No Has following equipment at home: None  PLOF: Independent  PATIENT GOALS: Improve posture back to where we had it last time I did aquatics. Want to tolerate sitting on the floor for 10 minutes and be able to get up from the floor without using  NEXT MD VISIT: as needed  OBJECTIVE:  Note: Objective measures were completed at Evaluation unless otherwise noted.  DIAGNOSTIC FINDINGS:  Xray in 2003: 25 deg centered at T-12-L1, 15 deg in thoracic labeled compensatory.    1/25 IMPRESSION: 1. L3-L4 moderate spinal canal stenosis with severe left and moderate right neural foraminal narrowing. Narrowing of the lateral recesses at this level could affect the descending L4 nerve roots. 2. L4-L5 moderate left neural foraminal narrowing. Effacement of the left lateral recess at this level could affect the descending left L5 nerve roots. 3. Additional mild neural foraminal narrowing on the right at L2-L3 and on the left at L5-S1.  PATIENT SURVEYS:  Modified Oswestry 9/50=18%   COGNITIVE STATUS: Within functional limits for tasks assessed        SENSATION: WFL   POSTURE:  Eval: thoraco dextroscoliosis, lumbar levoscoliosis, Rt ASIS elevation   HAND DOMINANCE:  Left   GAIT: Level of assistance: Complete Independence Comments: Rt trendelenburg  PALPATION: EVAL: spasm in right hip abd recreated concordant pain LUMBAR ROM:    LOWER EXTREMITY MMT:    HD  (Lb) Right eval Left eval  Hip flexion 21.1 21.8  Hip extension    Hip abduction 18.8 18.1  Hip adduction    Hip internal rotation    Hip external rotation    Knee flexion    Knee extension 5/5 5/5  Ankle dorsiflexion    Ankle plantarflexion    Ankle inversion    Ankle eversion     (Blank rows = not tested)    FUNCTIONAL TESTS:  4 stage balance passed    TREATMENT [facility] Adult PT Treatment:                                                DATE: 11/17/23 Pt seen for aquatic therapy today.   Treatment took place in water 3.5-4.75 ft in depth at the [facility] pool. Temp of water was 91.  Pt entered/exited the pool via stairs using alternating pattern with hand rail.    Exercises - Side to Side Pendulum Swing with Foam Dumbbells and Ankle Floats  - 1 x daily - 1-2 x weekly - 1-2 sets - 5-10 reps - Forward Backward Pendulum Swings with Hip Abduction and Adduction  - 1 x daily - 1-2 x weekly - 1-2 sets - 5-10 reps - Abdominal curls:  Chin on chest/Knees to chest  - 1 x daily - 1-2 x weekly - 1-2 sets - 5-10 reps - Abdominal curls: Chin on chest/Knees to opposite chest  - 1 x daily - 1-2 x weekly - 1-2 sets - 5-10 reps - Plank on Long Hand Float  - 1 x daily - 1-2 x weekly - 1-2 sets - 5-10 reps - Plank on Long Hand Float with Arm Lifts  - 1 x daily - 1-2 x weekly - 1-2 sets - 5-10 reps - Plank on Long Hand Float with Leg Lift  - 1 x daily - 1-2 x weekly - 1-2 sets - 5-10 reps - Suspended Side Plank on Long Hand Float  - 1 x daily - 1-2  x weekly - 1-2 sets - 5-10 reps - Sitting Balance on Pool Noodle  - 1 x daily - 1-2 x weekly    Pt requires the buoyancy and hydrostatic pressure of water for support, and to offload joints by unweighting joint load by at least 50 % in navel deep water and by at least 75-80% in chest to neck deep water.  Viscosity of the water is needed for resistance of strengthening. Water current perturbations provides challenge to standing balance requiring increased core activation.                                                                                                                                  PATIENT EDUCATION:  Education details: Discussed eval findings, rehab rationale, aquatic program progression/POC and pools in area. Patient is in agreement  Person educated: Patient Education method: Explanation Education comprehension: verbalized understanding  HOME EXERCISE PROGRAM: Land/aquatic [id number] from previous  episode  Aquatic This aquatic home exercise program from [employer] utilizes pictures from land based exercises, but has been adapted prior to lamination and issuance.   Access Code: [email or web address] URL: [email or web address] Date: 11/17/2023 Prepared by: Matilda Kohut  Exercises - Side to Side Pendulum Swing with Foam Dumbbells and Ankle Floats  - 1 x daily - 7 x weekly - 3 sets - 10 reps - Forward Backward Pendulum Swings with Hip Abduction and Adduction  - 1 x daily - 7 x weekly - 3 sets - 10 reps - Seated Flexion Stretch with Swiss Ball  - 1 x daily - 7 x weekly - 3 sets - 10 reps - Standing Isometric Cervical Rotation and Shoulder Abduction at Wall with Ball   - 1 x daily - 7 x weekly - 3 sets - 10 reps  Updated: 11/26/23   Exercises - Side to Side Pendulum Swing with Foam Dumbbells and Ankle Floats  - 1 x daily - 1-2 x weekly - 1-2 sets - 5-10 reps - Forward Backward Pendulum Swings with Hip Abduction and Adduction  - 1 x daily - 1-2 x weekly - 1-2 sets - 5-10 reps - Abdominal curls:  Chin on chest/Knees to chest  - 1 x daily - 1-2 x weekly - 1-2 sets - 5-10 reps - Abdominal curls: Chin on chest/Knees to opposite chest  - 1 x daily - 1-2 x weekly - 1-2 sets - 5-10 reps - Plank on Long Hand Float  - 1 x daily - 1-2 x weekly - 1-2 sets - 5-10 reps - Plank on Long Hand Float with Arm Lifts  - 1 x daily - 1-2 x weekly - 1-2 sets - 5-10 reps - Plank on Long [employer] with Leg Lift  - 1 x daily - 1-2 x weekly - 1-2 sets - 5-10 reps - Suspended Side Plank on Long Hand Float  - 1 x daily - 1-2 x weekly - 1-2 sets - 5-10 reps - Sitting  Balance on Pool Noodle  - 1 x daily - 1-2 x weekly  ASSESSMENT:  CLINICAL IMPRESSION: Pt continuing with her own exercise program daily in addition to the aquatic therapy.  She has already walked 5 miles today. No reported pain just some stiffness. Created aquatic final HEP. Pt instructed through using VC and demonstration, pendulum and plank  exercises focused on core strengthening and balance.  Plan to dc next session after issuance and final instruction on HEP.  Pt is in agreement.       Initial Impression Patient is a 68 y.o. f who was seen today for physical therapy evaluation and treatment for chronic LBP. Recently finished a land based PT episode for which she report compliance with HEP.  She has had aquatic in past and feels she benefits most from setting building strength and improving posture. She continues to have strength which is about equal bilaterally although compared to last episode [facility] at [facility] has decreased.  She is an active individual and will benefit from a short episode to re-instruct and add to aquatic exercise program for indep completion at personal pool  OBJECTIVE IMPAIRMENTS: decreased activity tolerance, decreased strength, increased muscle spasms, postural dysfunction, and pain.   ACTIVITY LIMITATIONS: carrying and lifting  REHAB POTENTIAL: Good  CLINICAL DECISION MAKING: Evolving/moderate complexity  EVALUATION COMPLEXITY: Moderate   GOALS: Goals reviewed with patient? Yes  SHORT TERM GOALS: Target date: 11/02/23  Pt will tolerate full aquatic sessions consistently without increase in pain and with improving function to demonstrate good toleration and effectiveness of intervention.  Baseline: Goal status: Met 11/12/23    LONG TERM GOALS: Target date: 12/04/23  Pt to improve on ODI by  5 % to demonstrate statistically significant Improvement in function. Baseline: 9/50=18% Goal status: INITIAL  2.  Pt to perform her regular exercise program without increased hip/LBP Baseline:  Goal status: Met 11/26/23  3.  Pt to be indep with final aquatic HEP for management of chronic condition Baseline:  Goal status: In progress 11/26/23  4.  Pt will improve strength in hips by up to 5 lbs to demonstrate improved overall physical function Baseline:  Goal status: INITIAL   PLAN:  PT FREQUENCY:  1-2x/week  PT DURATION: 6 weeks  PLANNED INTERVENTIONS: 97164- PT Re-evaluation, 97110-Therapeutic exercises, 97530- Therapeutic activity, 97112- Neuromuscular re-education, 97535- Self Care, [id number]- Manual therapy, [id number]- Gait training, [phone]- Aquatic Therapy, [phone]- Ionotophoresis 4mg /ml Dexamethasone , [id number] (1-2 muscles), [id number] (3+ muscles)- Dry Needling, Patient/Family education, Balance training, Stair training, Taping, Joint mobilization, DME instructions, Cryotherapy, and Moist heat.  PLAN FOR NEXT SESSION: aquatics for core and hip strengthening; pain management; HEP   Ronal Foots) Arwyn Besaw MPT 11/26/23 11:44 AM [facility] [address] [place], [place], [address] Phone: [phone]   Fax:  [phone]

## 2023-12-02 ENCOUNTER — Ambulatory Visit (HOSPITAL_BASED_OUTPATIENT_CLINIC_OR_DEPARTMENT_OTHER): Payer: Self-pay | Admitting: Physical Therapy

## 2023-12-02 ENCOUNTER — Encounter (HOSPITAL_BASED_OUTPATIENT_CLINIC_OR_DEPARTMENT_OTHER): Payer: Self-pay | Admitting: Physical Therapy

## 2023-12-02 DIAGNOSIS — M6281 Muscle weakness (generalized): Secondary | ICD-10-CM

## 2023-12-02 DIAGNOSIS — M25551 Pain in right hip: Secondary | ICD-10-CM

## 2023-12-02 DIAGNOSIS — M5459 Other low back pain: Secondary | ICD-10-CM | POA: Diagnosis not present

## 2023-12-02 DIAGNOSIS — R293 Abnormal posture: Secondary | ICD-10-CM

## 2023-12-02 NOTE — Therapy (Signed)
 OUTPATIENT PHYSICAL THERAPY THORACOLUMBAR DC PHYSICAL THERAPY DISCHARGE SUMMARY  Visits from Start of Care: 5  Current functional level related to goals / functional outcomes: Indep   Remaining deficits: Chronic condition   Education / Equipment: Management of condition;HEP   Patient agrees to discharge. Patient goals were all but 1 goal met. Patient is being discharged due to maximized rehab potential.     Patient Name: Stephanie Sanchez MRN: [id number] DOB:08/29/1955, 68 y.o., female Today's Date: 12/02/2023  END OF SESSION:  PT End of Session - 12/02/23 0934     Visit Number 5    Number of Visits 8    Date for PT Re-Evaluation 12/04/23    Authorization Type mcr    PT Start Time 0847    PT Stop Time 0930    PT Time Calculation (min) 43 min    Activity Tolerance Patient tolerated treatment well    Behavior During Therapy [facility] for tasks assessed/performed            Past Medical History:  Diagnosis Date   Arthritis    Coronary artery disease involving native coronary artery    cardiologist--- dr h. claudene--- CT coronary 04-21-2019 calcium  score 10;  nuclear study 09-30-2013  normal w/ ef 61%   DDD (degenerative disc disease), lumbar    Esophagitis    EGD 04-30-2020   Gastritis    EGD 04-30-2020   GERD (gastroesophageal reflux disease)    History of cervical dysplasia    History of colon polyps    History of gastric ulcer    2013   History of squamous cell carcinoma excision    left elbow   Hyperlipemia    Hypertension    Lichen simplex chronicus    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HCC)    first dx 2013;  s/p  WLE and Vulvectomy's   PONV (postoperative nausea and vomiting)    Psoriasis    Scoliosis    cervicothoracic region   Past Surgical History:  Procedure Laterality Date   BLADDER SUSPENSION  2008   sling   BREAST EXCISIONAL BIOPSY Left    BREAST REDUCTION SURGERY Bilateral 12/05/2021   Procedure: MAMMARY REDUCTION  (BREAST);  Surgeon: Lowery Estefana RAMAN, DO;  Location: [facility];  Service: Plastics;  Laterality: Bilateral;  3 hours   BUNIONECTOMY  2008   CARDIOVASCULAR STRESS TEST  09-30-2013   dr jeffrie   normal nuclear study/  no ischemia/  normal LV function and wall motion,  ef 61%   CATARACT EXTRACTION W/ INTRAOCULAR LENS  IMPLANT, BILATERAL  2016   COLONOSCOPY WITH ESOPHAGOGASTRODUODENOSCOPY (EGD)  04-30-2020  [facility] in [place]   GANGLION CYST EXCISION Left 06/20/2021   Procedure: Excision of left foot ganglion cyst;  Surgeon: Kit Rush, MD;  Location: [facility];  Service: Orthopedics;  Laterality: Left;    HAND SURGERY Right 11/02/2012   hand reconstruction at [facility]   RADIOACTIVE SEED GUIDED EXCISIONAL BREAST BIOPSY Right 11/09/2018   Procedure: RADIOACTIVE SEED GUIDED EXCISIONAL RIGHT BREAST BIOPSY AND RIGHT AREOLA BIOPSY;  Surgeon: Ebbie Cough, MD;  Location: [facility];  Service: General;  Laterality: Right;   REDUCTION MAMMAPLASTY     TONSILLECTOMY  1973  age 53   VAGINAL HYSTERECTOMY  1988  age 67   VULVA /PERINEUM BIOPSY N/A 08/29/2014   Procedure: ERROL DIVERS;  Surgeon: Maurilio Ship, MD;  Location: [facility];  Service: Gynecology;  Laterality: N/A;   VULVECTOMY N/A  08/29/2014   Procedure: WIDE LOCAL EXCISION OF VULVA;  Surgeon: Maurilio Ship, MD;  Location: [facility];  Service: Gynecology;  Laterality: N/A;   VULVECTOMY N/A 05/04/2015   Procedure: WIDE LOCAL EXCISION LEFT  VULVAR WITH BIOPSY OF RIGHT VULVA;  Surgeon: Maurilio Ship, MD;  Location: [facility];  Service: Gynecology;  Laterality: N/A;   VULVECTOMY Left 01/22/2016   Procedure: PARTIAL SIMPLE VULVECTOMY;  Surgeon: Maurilio Ship, MD;  Location[facility];  Service: Gynecology;  Laterality: Left;   VULVECTOMY N/A 02/14/2016   Procedure: WIDE EXCISION VULVECTOMY;  Surgeon: Maurilio Ship, MD;  Location: [facility];  Service:  Gynecology;  Laterality: N/A;   VULVECTOMY N/A 05/09/2020   Procedure: WIDE LOCAL EXCISION VULVECTOMY;  Surgeon: Ship Maurilio, MD;  Location: [facility]  Service: Gynecology;  Laterality: N/A;   Patient Active Problem List   Diagnosis Date Noted   Elevated coronary artery calcium  score 02/12/2023   Dyslipidemia 02/11/2023   Essential hypertension 02/11/2023   Changing skin lesion 01/10/2022   Urge incontinence 06/11/2020   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HCC) 05/09/2020   Encounter for counseling 03/18/2019   Symptomatic mammary hypertrophy 03/18/2019   Neck pain 03/18/2019   Back pain 03/18/2019   Vulval cellulitis 02/27/2016   Lichen simplex chronicus 09/26/2014   Chest pain 10/21/2013   Dyspnea 10/21/2013   GERD (gastroesophageal reflux disease) 10/21/2013   Idiopathic scoliosis 10/21/2013   History of colonic polyps 10/21/2013   Genital atrophy of female 07/02/2012    PCP: Oneil Neth  REFERRING PROVIDER: [name], MD   REFERRING DIAG: M51.360 (ICD-10-CM) - Other intervertebral disc degeneration, lumbar region with discogenic back pain only   Rationale for Evaluation and Treatment: Rehabilitation  THERAPY DIAG:  Other low back pain  Muscle weakness (generalized)  Abnormal posture  Pain in right hip  ONSET DATE: chronic  SUBJECTIVE:                                                                                                                                                                                           SUBJECTIVE STATEMENT: Walked 5 miles this morning.  Some stiffness in my left hip area   Initial Subjective Pain in left trap today 5/10.  Left hip 0/10 today.  When flared in hip 9/10.  Left hip will talk to me each days, will buzz.  If I sit it will pass. Feel like my posture was better when I was doing the aquatic therapy.  Had to stop for a surgical procedure needed  PERTINENT HISTORY:  Bladder sling  PAIN:  Are you  having  pain? Yes: NPRS scale: current 2/10 Pain location: left trap; left hip (varies) Pain description: ache Aggravating factors: shopping Relieving factors: resting; TENS unit  PRECAUTIONS: None  RED FLAGS: None   WEIGHT BEARING RESTRICTIONS: No  FALLS:  Has patient fallen in last 6 months? No  LIVING ENVIRONMENT: Lives with: lives alone Lives in: House/apartment Stairs: No Has following equipment at home: None  PLOF: Independent  PATIENT GOALS: Improve posture back to where we had it last time I did aquatics. Want to tolerate sitting on the floor for 10 minutes and be able to get up from the floor without using  NEXT MD VISIT: as needed  OBJECTIVE:  Note: Objective measures were completed at Evaluation unless otherwise noted.  DIAGNOSTIC FINDINGS:  Xray in 2003: 25 deg centered at T-12-L1, 15 deg in thoracic labeled compensatory.    1/25 IMPRESSION: 1. L3-L4 moderate spinal canal stenosis with severe left and moderate right neural foraminal narrowing. Narrowing of the lateral recesses at this level could affect the descending L4 nerve roots. 2. L4-L5 moderate left neural foraminal narrowing. Effacement of the left lateral recess at this level could affect the descending left L5 nerve roots. 3. Additional mild neural foraminal narrowing on the right at L2-L3 and on the left at L5-S1.  PATIENT SURVEYS:  Modified Oswestry 9/50=18%   COGNITIVE STATUS: Within functional limits for tasks assessed        SENSATION: WFL   POSTURE:  Eval: thoraco dextroscoliosis, lumbar levoscoliosis, Rt ASIS elevation   HAND DOMINANCE:  Left   GAIT: Level of assistance: Complete Independence Comments: Rt trendelenburg  PALPATION: EVAL: spasm in right hip abd recreated concordant pain LUMBAR ROM:    LOWER EXTREMITY MMT:    HD  (Lb) Right eval Left eval R / L 12/02/23  Hip flexion 21.1 21.8 28.2 / 30.1  Hip extension     Hip abduction 18.8 18.1 25.2 / 26.4  Hip  adduction     Hip internal rotation     Hip external rotation     Knee flexion     Knee extension 5/5 5/5   Ankle dorsiflexion     Ankle plantarflexion     Ankle inversion     Ankle eversion      (Blank rows = not tested)    FUNCTIONAL TESTS:  4 stage balance passed    TREATMENT [facility] Adult PT Treatment:                                                DATE: 12/02/23 Pt seen for aquatic therapy today.  Treatment took place in water 3.5-4.75 ft in depth at the [facility] pool. Temp of water was 91.  Pt entered/exited the pool via stairs using alternating pattern with hand rail.    Exercises - Side to Side Pendulum Swing with Foam Dumbbells and Ankle Floats  - 1 x daily - 1-2 x weekly - 1-2 sets - 5-10 reps - Forward Backward Pendulum Swings with Hip Abduction and Adduction - Abdominal curls:  Chin on chest/Knees to chest   - Abdominal curls: Chin on chest/Knees to opposite chest - Plank on Long Hand Float  - Plank on [employer] with Arm Lifts  - Plank on Patent examiner with Leg Lift  - Suspended Side Plank on Long Hand Float - Sitting Balance  on Pool Noodle     Pt requires the buoyancy and hydrostatic pressure of water for support, and to offload joints by unweighting joint load by at least 50 % in navel deep water and by at least 75-80% in chest to neck deep water.  Viscosity of the water is needed for resistance of strengthening. Water current perturbations provides challenge to standing balance requiring increased core activation.                                                                                                                                  PATIENT EDUCATION:  Education details: Discussed eval findings, rehab rationale, aquatic program progression/POC and pools in area. Patient is in agreement  Person educated: Patient Education method: Explanation Education comprehension: verbalized understanding  HOME EXERCISE PROGRAM: Land/aquatic  [id number] from previous episode  Aquatic This aquatic home exercise program from [employer] utilizes pictures from land based exercises, but has been adapted prior to lamination and issuance.   Access Code: [email or web address] URL: [email or web address] Date: 11/17/2023 Prepared by: Matilda Kohut  Exercises - Side to Side Pendulum Swing with Foam Dumbbells and Ankle Floats  - 1 x daily - 7 x weekly - 3 sets - 10 reps - Forward Backward Pendulum Swings with Hip Abduction and Adduction  - 1 x daily - 7 x weekly - 3 sets - 10 reps - Seated Flexion Stretch with Swiss Ball  - 1 x daily - 7 x weekly - 3 sets - 10 reps - Standing Isometric Cervical Rotation and Shoulder Abduction at Wall with Ball   - 1 x daily - 7 x weekly - 3 sets - 10 reps  Updated: 11/26/23   Exercises - Side to Side Pendulum Swing with Foam Dumbbells and Ankle Floats  - 1 x daily - 1-2 x weekly - 1-2 sets - 5-10 reps - Forward Backward Pendulum Swings with Hip Abduction and Adduction  - 1 x daily - 1-2 x weekly - 1-2 sets - 5-10 reps - Abdominal curls:  Chin on chest/Knees to chest  - 1 x daily - 1-2 x weekly - 1-2 sets - 5-10 reps - Abdominal curls: Chin on chest/Knees to opposite chest  - 1 x daily - 1-2 x weekly - 1-2 sets - 5-10 reps - Plank on Long Hand Float  - 1 x daily - 1-2 x weekly - 1-2 sets - 5-10 reps - Plank on Long Hand Float with Arm Lifts  - 1 x daily - 1-2 x weekly - 1-2 sets - 5-10 reps - Plank on Long [employer] with Leg Lift  - 1 x daily - 1-2 x weekly - 1-2 sets - 5-10 reps - Suspended Side Plank on Long Hand Float  - 1 x daily - 1-2 x weekly - 1-2 sets - 5-10 reps - Sitting Balance on Pool Noodle  - 1 x daily - 1-2 x weekly  ASSESSMENT:  CLINICAL IMPRESSION: Pt  issued as well as instructed on final aquatic HEP.  She is given vc and written clarifications on program to ensure understanding and indep.  All goals met.  She has indep access to pol and continue completion of other indep exercises  program. ODI goal not met but pt has scored with very low disability (index) initially. She is ready for dc.   OBJECTIVE IMPAIRMENTS: decreased activity tolerance, decreased strength, increased muscle spasms, postural dysfunction, and pain.   ACTIVITY LIMITATIONS: carrying and lifting  REHAB POTENTIAL: Good  CLINICAL DECISION MAKING: Evolving/moderate complexity  EVALUATION COMPLEXITY: Moderate   GOALS: Goals reviewed with patient? Yes  SHORT TERM GOALS: Target date: 11/02/23  Pt will tolerate full aquatic sessions consistently without increase in pain and with improving function to demonstrate good toleration and effectiveness of intervention.  Baseline: Goal status: Met 11/12/23    LONG TERM GOALS: Target date: 12/04/23  Pt to improve on ODI by  5 % to demonstrate statistically significant Improvement in function. Baseline: 9/50=18%; 8/50= 16% Goal status: progressed but not met 12/02/23  2.  Pt to perform her regular exercise program without increased hip/LBP Baseline:  Goal status: Met 11/26/23  3.  Pt to be indep with final aquatic HEP for management of chronic condition Baseline:  Goal status: In progress 11/26/23; Met 12/02/23  4.  Pt will improve strength in hips by up to 5 lbs to demonstrate improved overall physical function Baseline:  Goal status: MET 12/02/23                        PLAN:  PT FREQUENCY: 1-2x/week  PT DURATION: 6 weeks  PLANNED INTERVENTIONS: 97164- PT Re-evaluation, 97110-Therapeutic exercises, 97530- Therapeutic activity, 97112- Neuromuscular re-education, 97535- Self Care, 02859- Manual therapy, [id number]- Gait training, [phone]- Aquatic Therapy, [phone]- Ionotophoresis 4mg /ml Dexamethasone , 79439 (1-2 muscles), 20561 (3+ muscles)- Dry Needling, Patient/Family education, Balance training, Stair training, Taping, Joint mobilization, DME instructions, Cryotherapy, and Moist heat.  PLAN FOR NEXT SESSION: aquatics for core and hip strengthening; pain  management; HEP   Ronal Foots) Rease Swinson MPT 12/02/23 9:36 AM [facility] [address] [place], [place], [address] Phone: [phone]   Fax:  [phone]

## 2024-03-17 NOTE — Progress Notes (Unsigned)
  Cardiology Office Note:   Date:  03/18/2024  ID:  Stephanie Sanchez, DOB 06/24/1955, MRN [id number] PCP: Stephanie Anes, MD  [facility] Providers Cardiologist:  Lynwood Schilling, MD {  History of Present Illness:   Stephanie Sanchez is a 68 y.o. female who was previously seen by Dr. Esmeralda Sharps for mildly elevated coronary calcium .  Since I last saw her she denies any new cardiovascular problems.  The patient denies any new symptoms such as chest discomfort, neck or arm discomfort. There has been no new shortness of breath, PND or orthopnea. There have been no reported palpitations, presyncope or syncope.  She exercises routinely.  She walks 3 to 5 miles per day.  ROS: As stated in the HPI and negative for all other systems.  Studies Reviewed:    EKG:   EKG Interpretation Date/Time:  Friday March 18 2024 10:31:59 EST Ventricular Rate:  91 PR Interval:  172 QRS Duration:  92 QT Interval:  372 QTC Calculation: 457 R Axis:   -14  Text Interpretation: Normal sinus rhythm Possible Left atrial enlargement  Poor anterior R wave progression When compared with ECG of 12-Feb-2023 09:57, No significant change was found Confirmed by Schilling Lynwood ([id number]) on 03/18/2024 10:38:32 AM    Risk Assessment/Calculations:           Physical Exam:   VS:  BP 120/78   Pulse 91   Ht 5' 2 (1.575 m)   Wt 114 lb (51.7 kg)   SpO2 98%   BMI 20.85 kg/m    Wt Readings from Last 3 Encounters:  03/18/24 114 lb (51.7 kg)  02/16/23 120 lb 9.6 oz (54.7 kg)  02/12/23 121 lb 6.4 oz (55.1 kg)     GEN: Well nourished, well developed in no acute distress NECK: No JVD; No carotid bruits CARDIAC: RRR, no murmurs, rubs, gallops RESPIRATORY:  Clear to auscultation without rales, wheezing or rhonchi  ABDOMEN: Soft, non-tender, non-distended EXTREMITIES:  No edema; No deformity   ASSESSMENT AND PLAN:    Elevated calcium  score:   She has had no new symptoms.  We again discussed primary risk reduction.   No further testing is indicated other than labs below.   HTN: Her blood pressure is at target.  No change in therapy.   Dyslipidemia:   She is not taking her Repatha twice a month as has been suggested and I will check a lipid profile and LP(a) today.  Per her request I will go ahead and order CBC, c-Met, TSH for her yearly blood work and try to make sure these results get to Stephanie Anes, MD  Abnormal EKG:   She has some nonspecific RSR prime in V1 and V2 unchanged from previous.   Follow up with me in 12 months   Signed, Lynwood Schilling, MD

## 2024-03-18 ENCOUNTER — Ambulatory Visit: Attending: Cardiology | Admitting: Cardiology

## 2024-03-18 ENCOUNTER — Encounter: Payer: Self-pay | Admitting: Cardiology

## 2024-03-18 VITALS — BP 120/78 | HR 91 | Ht 62.0 in | Wt 114.0 lb

## 2024-03-18 DIAGNOSIS — R9431 Abnormal electrocardiogram [ECG] [EKG]: Secondary | ICD-10-CM | POA: Insufficient documentation

## 2024-03-18 DIAGNOSIS — I10 Essential (primary) hypertension: Secondary | ICD-10-CM | POA: Diagnosis present

## 2024-03-18 DIAGNOSIS — R002 Palpitations: Secondary | ICD-10-CM | POA: Insufficient documentation

## 2024-03-18 DIAGNOSIS — E785 Hyperlipidemia, unspecified: Secondary | ICD-10-CM | POA: Diagnosis present

## 2024-03-18 DIAGNOSIS — R931 Abnormal findings on diagnostic imaging of heart and coronary circulation: Secondary | ICD-10-CM | POA: Diagnosis present

## 2024-03-18 NOTE — Patient Instructions (Signed)
 Medication Instructions:  Your physician recommends that you continue on your current medications as directed. Please refer to the Current Medication list given to you today.  *If you need a refill on your cardiac medications before your next appointment, please call your pharmacy*  Lab Work: TSH, Lipid panel, CBC, Lpa, CMET today at [facility] If you have labs (blood work) drawn today and your tests are completely normal, you will receive your results only by: MyChart Message (if you have MyChart) OR A paper copy in the mail If you have any lab test that is abnormal or we need to change your treatment, we will call you to review the results.  Testing/Procedures: NONE  Follow-Up: At [facility], you and your health needs are our priority.  As part of our continuing mission to provide you with exceptional heart care, our providers are all part of one team.  This team includes your primary Cardiologist (physician) and Advanced Practice Providers or APPs (Physician Assistants and Nurse Practitioners) who all work together to provide you with the care you need, when you need it.  Your next appointment:   1 year(s)  Provider:   Lynwood Schilling, MD    We recommend signing up for the patient portal called MyChart.  Sign up information is provided on this After Visit Summary.  MyChart is used to connect with patients for Virtual Visits (Telemedicine).  Patients are able to view lab/test results, encounter notes, upcoming appointments, etc.  Non-urgent messages can be sent to your provider as well.   To learn more about what you can do with MyChart, go to [email or web address].

## 2024-03-20 ENCOUNTER — Ambulatory Visit: Payer: Self-pay | Admitting: Cardiology

## 2024-03-20 DIAGNOSIS — E875 Hyperkalemia: Secondary | ICD-10-CM

## 2024-03-21 NOTE — Telephone Encounter (Signed)
 Spoke with pt regarding her results. Pt agreeable to plan. BMET ordered and released. Pt verbalized understanding. All questions if any were answered.

## 2024-03-21 NOTE — Telephone Encounter (Signed)
-----   Message from Lynwood Schilling sent at 03/20/2024  4:24 PM EST ----- Please make sure that she is taking her Repatha every two weeks.  Potassium is elevated but this has been because of hemolysis in the past.  I would like to repeat a BMET this week.  Call Ms. Buren  with the results and send results to Shayne Anes, MD  ----- Message ----- From: Interface, [facility] Lab Results In Sent: 03/19/2024   6:38 AM EST To: Lynwood Schilling, MD

## 2024-03-22 LAB — COMPREHENSIVE METABOLIC PANEL WITH GFR
ALT: 25 IU/L (ref 0–32)
AST: 23 IU/L (ref 0–40)
Albumin: 4.8 g/dL (ref 3.9–4.9)
Alkaline Phosphatase: 72 IU/L (ref 49–135)
BUN/Creatinine Ratio: 22 (ref 12–28)
BUN: 15 mg/dL (ref 8–27)
Bilirubin Total: 0.4 mg/dL (ref 0.0–1.2)
CO2: 25 mmol/L (ref 20–29)
Calcium: 9.8 mg/dL (ref 8.7–10.3)
Chloride: 102 mmol/L (ref 96–106)
Creatinine, Ser: 0.68 mg/dL (ref 0.57–1.00)
Globulin, Total: 1.9 g/dL (ref 1.5–4.5)
Glucose: 77 mg/dL (ref 70–99)
Potassium: 5.3 mmol/L — ABNORMAL HIGH (ref 3.5–5.2)
Sodium: 143 mmol/L (ref 134–144)
Total Protein: 6.7 g/dL (ref 6.0–8.5)
eGFR: 94 mL/min/1.73 (ref >59)

## 2024-03-22 LAB — LIPID PANEL
Chol/HDL Ratio: 3.5 ratio (ref 0.0–4.4)
Cholesterol, Total: 170 mg/dL (ref 100–199)
HDL: 48 mg/dL (ref >39)
LDL Chol Calc (NIH): 104 mg/dL — ABNORMAL HIGH (ref 0–99)
Triglycerides: 101 mg/dL (ref 0–149)
VLDL Cholesterol Cal: 18 mg/dL (ref 5–40)

## 2024-03-22 LAB — CBC
Hematocrit: 47.5 % — ABNORMAL HIGH (ref 34.0–46.6)
Hemoglobin: 15.7 g/dL (ref 11.1–15.9)
MCH: 32.4 pg (ref 26.6–33.0)
MCHC: 33.1 g/dL (ref 31.5–35.7)
MCV: 98 fL — ABNORMAL HIGH (ref 79–97)
Platelets: 272 x10E3/uL (ref 150–450)
RBC: 4.84 x10E6/uL (ref 3.77–5.28)
RDW: 11.6 % — ABNORMAL LOW (ref 11.7–15.4)
WBC: 5.5 x10E3/uL (ref 3.4–10.8)

## 2024-03-22 LAB — TSH: TSH: 1.99 u[IU]/mL (ref 0.450–4.500)

## 2024-03-22 LAB — LIPOPROTEIN A (LPA): Lipoprotein (a): <8.4 nmol/L (ref <75.0)

## 2024-03-24 ENCOUNTER — Encounter: Payer: Self-pay | Admitting: Cardiology

## 2024-03-24 ENCOUNTER — Other Ambulatory Visit: Payer: Self-pay

## 2024-03-24 DIAGNOSIS — R931 Abnormal findings on diagnostic imaging of heart and coronary circulation: Secondary | ICD-10-CM

## 2024-03-24 DIAGNOSIS — E875 Hyperkalemia: Secondary | ICD-10-CM

## 2024-03-24 DIAGNOSIS — I10 Essential (primary) hypertension: Secondary | ICD-10-CM

## 2024-03-29 LAB — BASIC METABOLIC PANEL WITH GFR
BUN/Creatinine Ratio: 21 (ref 12–28)
BUN: 18 mg/dL (ref 8–27)
CO2: 25 mmol/L (ref 20–29)
Calcium: 10.6 mg/dL — ABNORMAL HIGH (ref 8.7–10.3)
Chloride: 100 mmol/L (ref 96–106)
Creatinine, Ser: 0.84 mg/dL (ref 0.57–1.00)
Glucose: 80 mg/dL (ref 70–99)
Potassium: 4.3 mmol/L (ref 3.5–5.2)
Sodium: 142 mmol/L (ref 134–144)
eGFR: 75 mL/min/1.73 (ref >59)
